# Patient Record
Sex: Female | Born: 2000 | Race: Black or African American | Hispanic: No
Health system: Southern US, Community
[De-identification: ages and names within clinical notes are randomized; demographics above are authoritative.]

## PROBLEM LIST (undated history)

## (undated) ENCOUNTER — Inpatient Hospital Stay (HOSPITAL_COMMUNITY): Payer: Self-pay

## (undated) ENCOUNTER — Emergency Department (HOSPITAL_COMMUNITY): Admission: EM | Payer: No Typology Code available for payment source

## (undated) DIAGNOSIS — D649 Anemia, unspecified: Secondary | ICD-10-CM

## (undated) DIAGNOSIS — F329 Major depressive disorder, single episode, unspecified: Secondary | ICD-10-CM

## (undated) DIAGNOSIS — F419 Anxiety disorder, unspecified: Secondary | ICD-10-CM

## (undated) DIAGNOSIS — I10 Essential (primary) hypertension: Secondary | ICD-10-CM

## (undated) DIAGNOSIS — Z3490 Encounter for supervision of normal pregnancy, unspecified, unspecified trimester: Secondary | ICD-10-CM

## (undated) DIAGNOSIS — N390 Urinary tract infection, site not specified: Secondary | ICD-10-CM

## (undated) DIAGNOSIS — F32A Depression, unspecified: Secondary | ICD-10-CM

## (undated) DIAGNOSIS — B9689 Other specified bacterial agents as the cause of diseases classified elsewhere: Secondary | ICD-10-CM

## (undated) DIAGNOSIS — N760 Acute vaginitis: Secondary | ICD-10-CM

## (undated) DIAGNOSIS — D573 Sickle-cell trait: Secondary | ICD-10-CM

## (undated) HISTORY — PX: NO PAST SURGERIES: SHX2092

## (undated) HISTORY — PX: FINGER SURGERY: SHX640

---
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
## 2018-01-22 ENCOUNTER — Emergency Department (HOSPITAL_COMMUNITY): Payer: No Typology Code available for payment source

## 2018-01-22 ENCOUNTER — Other Ambulatory Visit: Payer: Self-pay

## 2018-01-22 ENCOUNTER — Encounter (HOSPITAL_COMMUNITY): Payer: Self-pay | Admitting: Emergency Medicine

## 2018-01-22 ENCOUNTER — Emergency Department (HOSPITAL_COMMUNITY)
Admission: EM | Admit: 2018-01-22 | Discharge: 2018-01-22 | Disposition: A | Discharge status: Home / Self Care | Payer: No Typology Code available for payment source | Attending: Emergency Medicine | Admitting: Emergency Medicine

## 2018-01-22 DIAGNOSIS — M542 Cervicalgia: Secondary | ICD-10-CM | POA: Insufficient documentation

## 2018-01-22 DIAGNOSIS — F17200 Nicotine dependence, unspecified, uncomplicated: Secondary | ICD-10-CM | POA: Diagnosis not present

## 2018-01-22 DIAGNOSIS — Y939 Activity, unspecified: Secondary | ICD-10-CM | POA: Insufficient documentation

## 2018-01-22 DIAGNOSIS — Z79899 Other long term (current) drug therapy: Secondary | ICD-10-CM | POA: Diagnosis not present

## 2018-01-22 DIAGNOSIS — R1030 Lower abdominal pain, unspecified: Secondary | ICD-10-CM | POA: Insufficient documentation

## 2018-01-22 DIAGNOSIS — Y929 Unspecified place or not applicable: Secondary | ICD-10-CM | POA: Insufficient documentation

## 2018-01-22 DIAGNOSIS — Y999 Unspecified external cause status: Secondary | ICD-10-CM | POA: Insufficient documentation

## 2018-01-22 MED ORDER — IBUPROFEN 600 MG PO TABS: 600.0000 mg | ORAL_TABLET | Freq: Four times a day (QID) | ORAL | 0 refills | Status: DC | PRN

## 2018-01-22 MED ORDER — IBUPROFEN 400 MG PO TABS
600.0000 mg | ORAL_TABLET | Freq: Once | ORAL | Status: AC
  Administered 2018-01-22: 05:00:00 600 mg via ORAL
  Filled 2018-01-22: qty 1

## 2018-01-22 MED ORDER — ACETAMINOPHEN 500 MG PO TABS: 500.0000 mg | ORAL_TABLET | Freq: Four times a day (QID) | ORAL | 0 refills | Status: DC | PRN

## 2018-01-22 MED ORDER — METHOCARBAMOL 500 MG PO TABS: 500.0000 mg | ORAL_TABLET | Freq: Two times a day (BID) | ORAL | 0 refills | Status: DC
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
## 2018-03-13 ENCOUNTER — Encounter (HOSPITAL_COMMUNITY): Payer: Self-pay | Admitting: Emergency Medicine

## 2018-03-13 DIAGNOSIS — Z5321 Procedure and treatment not carried out due to patient leaving prior to being seen by health care provider: Secondary | ICD-10-CM | POA: Insufficient documentation

## 2018-03-13 DIAGNOSIS — R109 Unspecified abdominal pain: Secondary | ICD-10-CM | POA: Insufficient documentation

## 2018-03-13 LAB — URINALYSIS, ROUTINE W REFLEX MICROSCOPIC
BILIRUBIN URINE: NEGATIVE
Glucose, UA: NEGATIVE mg/dL
Hgb urine dipstick: NEGATIVE
Ketones, ur: NEGATIVE mg/dL
NITRITE: NEGATIVE
PH: 5 (ref 5.0–8.0)
Protein, ur: NEGATIVE mg/dL
SPECIFIC GRAVITY, URINE: 1.013 (ref 1.005–1.030)

## 2018-03-13 LAB — COMPREHENSIVE METABOLIC PANEL
ALBUMIN: 4 g/dL (ref 3.5–5.0)
ALK PHOS: 63 U/L (ref 38–126)
ALT: 15 U/L (ref 14–54)
AST: 22 U/L (ref 15–41)
Anion gap: 12 (ref 5–15)
BILIRUBIN TOTAL: 1.1 mg/dL (ref 0.3–1.2)
CO2: 21 mmol/L — ABNORMAL LOW (ref 22–32)
CREATININE: 0.96 mg/dL (ref 0.44–1.00)
Calcium: 9.3 mg/dL (ref 8.9–10.3)
Chloride: 105 mmol/L (ref 101–111)
GFR calc Af Amer: >60 mL/min (ref >60)
GLUCOSE: 85 mg/dL (ref 65–99)
Potassium: 3.7 mmol/L (ref 3.5–5.1)
Sodium: 138 mmol/L (ref 135–145)
TOTAL PROTEIN: 7.2 g/dL (ref 6.5–8.1)

## 2018-03-13 LAB — CBC
HEMATOCRIT: 38.8 % (ref 36.0–46.0)
Hemoglobin: 13.1 g/dL (ref 12.0–15.0)
MCH: 27.7 pg (ref 26.0–34.0)
MCHC: 33.8 g/dL (ref 30.0–36.0)
MCV: 82 fL (ref 78.0–100.0)
PLATELETS: 266 10*3/uL (ref 150–400)
RBC: 4.73 MIL/uL (ref 3.87–5.11)
RDW: 13.8 % (ref 11.5–15.5)
WBC: 9.4 10*3/uL (ref 4.0–10.5)

## 2018-03-13 LAB — LIPASE, BLOOD: Lipase: 27 U/L (ref 11–51)

## 2018-03-13 LAB — I-STAT BETA HCG BLOOD, ED (MC, WL, AP ONLY)

## 2018-03-13 MED ORDER — ONDANSETRON 4 MG PO TBDP
4.0000 mg | ORAL_TABLET | Freq: Once | ORAL | Status: AC | PRN
  Administered 2018-03-13: 4 mg via ORAL
  Filled 2018-03-13: qty 1

## 2018-03-13 NOTE — ED Triage Notes (Signed)
Pt states issues with her medicaid card, has missed 7 days of birth contort because she could not get a refill. Pt has had nausea for the duration. LMP 2 weeks ago. Pt states family member just had a stomach virus. Denies diarrhea. Afebrile at triage.

## 2018-03-14 ENCOUNTER — Emergency Department (HOSPITAL_COMMUNITY)
Admission: EM | Admit: 2018-03-14 | Discharge: 2018-03-14 | Disposition: A | Discharge status: Home / Self Care | Payer: Medicaid Other | Attending: Emergency Medicine | Admitting: Emergency Medicine

## 2018-03-14 NOTE — ED Notes (Signed)
Called x 2 for room, no answer.

## 2018-06-01 ENCOUNTER — Emergency Department (HOSPITAL_COMMUNITY)
Admission: EM | Admit: 2018-06-01 | Discharge: 2018-06-01 | Disposition: A | Discharge status: Home / Self Care | Payer: Medicaid Other | Attending: Emergency Medicine | Admitting: Emergency Medicine

## 2018-06-01 ENCOUNTER — Other Ambulatory Visit: Payer: Self-pay

## 2018-06-01 ENCOUNTER — Encounter (HOSPITAL_COMMUNITY): Payer: Self-pay

## 2018-06-01 DIAGNOSIS — Z87891 Personal history of nicotine dependence: Secondary | ICD-10-CM | POA: Insufficient documentation

## 2018-06-01 DIAGNOSIS — R1084 Generalized abdominal pain: Secondary | ICD-10-CM | POA: Diagnosis present

## 2018-06-01 DIAGNOSIS — R197 Diarrhea, unspecified: Secondary | ICD-10-CM | POA: Insufficient documentation

## 2018-06-01 DIAGNOSIS — Z79899 Other long term (current) drug therapy: Secondary | ICD-10-CM | POA: Insufficient documentation

## 2018-06-01 DIAGNOSIS — R112 Nausea with vomiting, unspecified: Secondary | ICD-10-CM | POA: Insufficient documentation

## 2018-06-01 LAB — URINALYSIS, ROUTINE W REFLEX MICROSCOPIC
BILIRUBIN URINE: NEGATIVE
GLUCOSE, UA: NEGATIVE mg/dL
Hgb urine dipstick: NEGATIVE
KETONES UR: NEGATIVE mg/dL
Nitrite: NEGATIVE
PH: 5 (ref 5.0–8.0)
PROTEIN: 30 mg/dL — AB
Specific Gravity, Urine: 1.013 (ref 1.005–1.030)

## 2018-06-01 LAB — LIPASE, BLOOD: LIPASE: 30 U/L (ref 11–51)

## 2018-06-01 LAB — COMPREHENSIVE METABOLIC PANEL
ALT: 13 U/L — ABNORMAL LOW (ref 14–54)
ANION GAP: 7 (ref 5–15)
AST: 17 U/L (ref 15–41)
Albumin: 4.1 g/dL (ref 3.5–5.0)
Alkaline Phosphatase: 56 U/L (ref 38–126)
BUN: 5 mg/dL — ABNORMAL LOW (ref 6–20)
CHLORIDE: 107 mmol/L (ref 101–111)
CO2: 24 mmol/L (ref 22–32)
CREATININE: 0.9 mg/dL (ref 0.44–1.00)
Calcium: 9.3 mg/dL (ref 8.9–10.3)
Glucose, Bld: 89 mg/dL (ref 65–99)
POTASSIUM: 3.9 mmol/L (ref 3.5–5.1)
SODIUM: 138 mmol/L (ref 135–145)
Total Bilirubin: 0.8 mg/dL (ref 0.3–1.2)
Total Protein: 6.9 g/dL (ref 6.5–8.1)

## 2018-06-01 LAB — CBC
HCT: 38.3 % (ref 36.0–46.0)
HEMOGLOBIN: 12.8 g/dL (ref 12.0–15.0)
MCH: 27.3 pg (ref 26.0–34.0)
MCHC: 33.4 g/dL (ref 30.0–36.0)
MCV: 81.7 fL (ref 78.0–100.0)
PLATELETS: 274 10*3/uL (ref 150–400)
RBC: 4.69 MIL/uL (ref 3.87–5.11)
RDW: 13.5 % (ref 11.5–15.5)
WBC: 7.2 10*3/uL (ref 4.0–10.5)

## 2018-06-01 LAB — I-STAT BETA HCG BLOOD, ED (MC, WL, AP ONLY): I-stat hCG, quantitative: <5 m[IU]/mL (ref <5)

## 2018-06-01 MED ORDER — GI COCKTAIL ~~LOC~~
30.0000 mL | Freq: Once | ORAL | Status: AC
  Administered 2018-06-01: 30 mL via ORAL
  Filled 2018-06-01: qty 30

## 2018-06-01 MED ORDER — ONDANSETRON 4 MG PO TBDP: 4.0000 mg | ORAL_TABLET | Freq: Three times a day (TID) | ORAL | 0 refills | Status: DC | PRN

## 2018-06-01 NOTE — Discharge Instructions (Signed)
Follow up with your PCP.  Return for inability to eat or drink, persistent fever >5 days, sudden worsening abdominal pain. ° °

## 2018-06-01 NOTE — ED Notes (Signed)
Urine culture tube sent to mail lab to hold.

## 2018-06-01 NOTE — ED Triage Notes (Signed)
Pt states she has been having abdominal pain with blood in her stool and vomiting. She states her stool is loose dark orange and brown. Last BM today, states she last saw blood in her stool 4 days ago and was told by her pcp she thought it was irritation. Also reports she was told by her pcp that her "liver was swollen".

## 2018-06-01 NOTE — ED Provider Notes (Signed)
[facility] EMERGENCY DEPARTMENT Provider Note   CSN: [id number] Arrival date & time: 06/01/18  1340     History   Chief Complaint Chief Complaint  Patient presents with  . Abdominal Pain    HPI Andrea Archer is a 17 y.o. female.  16 yo F with a chief complaint of diffuse abdominal pain.  This is described as crampy.  Associated with vomiting and diarrhea.  Was initially preceded with constipation.  She feels that she has had some lead in her stool.  Describes it as bright red.  Denies lightheaded dizzy near syncopal.  Denies fevers or chills.  She has a family member that had a similar illness though she thinks is unlikely that she caught it from them.  The history is provided by the patient.  Abdominal Pain   This is a new problem. The current episode started more than 1 week ago. The problem occurs constantly. The problem has been gradually worsening. The pain is located in the generalized abdominal region. The quality of the pain is sharp and shooting. The pain is at a severity of 10/10. The pain is severe. Associated symptoms include diarrhea, nausea and vomiting. Pertinent negatives include fever, dysuria, headaches, arthralgias and myalgias. Nothing aggravates the symptoms. Nothing relieves the symptoms.    History reviewed. No pertinent past medical history.  There are no active problems to display for this patient.   No past surgical history on file.   OB History   None      Home Medications    Prior to Admission medications   Medication Sig Start Date End Date Taking? Authorizing Provider  acetaminophen (TYLENOL) 500 MG tablet Take 1 tablet (500 mg total) by mouth every 6 (six) hours as needed. 01/22/18   Law, Bea Graff, PA-C  cephALEXin (KEFLEX) 500 MG capsule Take 1 capsule (500 mg total) by mouth 4 (four) times daily. Take for 10 days. Do not stop antibiotic before 10 days even with resolution of symptoms 08/22/16   Ancil Linsey, MD    dicyclomine (BENTYL) 20 MG tablet Take 1 tablet (20 mg total) by mouth 2 (two) times daily as needed (abominal cramping). 11/11/15   Muthersbaugh, Jarrett Soho, PA-C  ibuprofen (ADVIL,MOTRIN) 600 MG tablet Take 1 tablet (600 mg total) by mouth every 6 (six) hours as needed. 01/22/18   Law, Bea Graff, PA-C  methocarbamol (ROBAXIN) 500 MG tablet Take 1 tablet (500 mg total) by mouth 2 (two) times daily. 01/22/18   Law, Bea Graff, PA-C  naproxen (NAPROSYN) 500 MG tablet Take 1 tablet (500 mg total) by mouth 2 (two) times daily. 02/20/17   Horton, Barbette Hair, MD  ondansetron (ZOFRAN ODT) 4 MG disintegrating tablet Take 1 tablet (4 mg total) by mouth every 8 (eight) hours as needed for nausea or vomiting. 06/01/18   Deno Etienne, DO    Family History No family history on file.  Social History Social History   Tobacco Use  . Smoking status: Former Research scientist (life sciences)  . Smokeless tobacco: Never Used  Substance Use Topics  . Alcohol use: No  . Drug use: No     Allergies   Patient has no known allergies.   Review of Systems Review of Systems  Constitutional: Negative for chills and fever.  HENT: Negative for congestion and rhinorrhea.   Eyes: Negative for redness and visual disturbance.  Respiratory: Negative for shortness of breath and wheezing.   Cardiovascular: Negative for chest pain and palpitations.  Gastrointestinal: Positive for abdominal pain,  blood in stool, diarrhea, nausea and vomiting.  Genitourinary: Negative for dysuria and urgency.  Musculoskeletal: Negative for arthralgias and myalgias.  Skin: Negative for pallor and wound.  Neurological: Negative for dizziness and headaches.     Physical Exam Updated Vital Signs BP (!) 119/2 (BP Location: Right Arm)   Pulse 72   Temp 99.2 F (37.3 C) (Oral)   Resp 16   Ht 5\' 6"  (1.676 m)   Wt 60.8 kg (134 lb)   LMP 05/20/2018   SpO2 100%   BMI 21.63 kg/m   Physical Exam  Constitutional: She is oriented to person, place, and time. She  appears well-developed and well-nourished. No distress.  HENT:  Head: Normocephalic and atraumatic.  Eyes: Pupils are equal, round, and reactive to light. EOM are normal.  Neck: Normal range of motion. Neck supple.  Cardiovascular: Normal rate and regular rhythm. Exam reveals no gallop and no friction rub.  No murmur heard. Pulmonary/Chest: Effort normal. She has no wheezes. She has no rales.  Abdominal: Soft. She exhibits no distension and no mass. There is tenderness (mild diffuse). There is no guarding.  Musculoskeletal: She exhibits no edema or tenderness.  Neurological: She is alert and oriented to person, place, and time.  Skin: Skin is warm and dry. She is not diaphoretic.  Psychiatric: She has a normal mood and affect. Her behavior is normal.  Nursing note and vitals reviewed.    ED Treatments / Results  Labs (all labs ordered are listed, but only abnormal results are displayed) Labs Reviewed  COMPREHENSIVE METABOLIC PANEL - Abnormal; Notable for the following components:      Result Value   BUN 5 (*)    ALT 13 (*)    All other components within normal limits  URINALYSIS, ROUTINE W REFLEX MICROSCOPIC - Abnormal; Notable for the following components:   Color, Urine AMBER (*)    APPearance TURBID (*)    Protein, ur 30 (*)    Leukocytes, UA LARGE (*)    Bacteria, UA MANY (*)    All other components within normal limits  GASTROINTESTINAL PANEL BY PCR, STOOL (REPLACES STOOL CULTURE)  LIPASE, BLOOD  CBC  I-STAT BETA HCG BLOOD, ED ([facility], [facility], [facility] ONLY)    EKG None  Radiology No results found.  Procedures Procedures (including critical care time)  Medications Ordered in ED Medications  gi cocktail (Maalox,Lidocaine,Donnatal) (30 mLs Oral Given 06/01/18 1603)     Initial Impression / Assessment and Plan / ED Course  I have reviewed the triage vital signs and the nursing notes.  Pertinent labs & imaging results that were available during my care of the patient were  reviewed by me and considered in my medical decision making (see chart for details).     17 yo F with a chief complaint of diffuse abdominal pain.  She is describing some blood in her stool.  Her symptoms of also gone on for a week and a half.  Because of this I will send off a stool study.  Have the patient take Zofran at home.  She was given GI follow-up as she is almost in the age range for inflammatory bowel disease.  Suggested she follow-up with her PCP in a couple days.  Strict return precautions were given.  4:33 PM:  I have discussed the diagnosis/risks/treatment options with the patient and believe the pt to be eligible for discharge home to follow-up with PCP, GI. We also discussed returning to the ED immediately if new  or worsening sx occur. We discussed the sx which are most concerning (e.g., sudden worsening pain, fever, inability to tolerate by mouth) that necessitate immediate return. Medications administered to the patient during their visit and any new prescriptions provided to the patient are listed below.  Medications given during this visit Medications  gi cocktail (Maalox,Lidocaine,Donnatal) (30 mLs Oral Given 06/01/18 1603)      The patient appears reasonably screen and/or stabilized for discharge and I doubt any other medical condition or other [facility] requiring further screening, evaluation, or treatment in the ED at this time prior to discharge.    Final Clinical Impressions(s) / ED Diagnoses   Final diagnoses:  Nausea vomiting and diarrhea    ED Discharge Orders        Ordered    ondansetron (ZOFRAN ODT) 4 MG disintegrating tablet  Every 8 hours PRN     06/01/18 [place], [place], DO 06/01/18 1633

## 2018-06-03 ENCOUNTER — Ambulatory Visit: Payer: Medicaid Other | Admitting: Gastroenterology

## 2018-06-17 ENCOUNTER — Encounter: Payer: Self-pay | Admitting: Gastroenterology

## 2018-06-17 ENCOUNTER — Ambulatory Visit (INDEPENDENT_AMBULATORY_CARE_PROVIDER_SITE_OTHER): Payer: Medicaid Other | Admitting: Gastroenterology

## 2018-06-17 VITALS — BP 112/72 | HR 84 | Ht 65.0 in | Wt 134.0 lb

## 2018-06-17 DIAGNOSIS — R1032 Left lower quadrant pain: Secondary | ICD-10-CM | POA: Diagnosis not present

## 2018-06-17 DIAGNOSIS — R194 Change in bowel habit: Secondary | ICD-10-CM

## 2018-06-17 MED ORDER — DICYCLOMINE HCL 10 MG PO CAPS: 10.0000 mg | ORAL_CAPSULE | Freq: Two times a day (BID) | ORAL | 1 refills | Status: DC

## 2018-06-17 NOTE — Patient Instructions (Signed)
Your provider has requested that you go to the basement level for lab work before leaving today. Press "B" on the elevator. The lab is located at the first door on the left as you exit the elevator.  We have sent the following medications to your pharmacy for you to pick up at your convenience:  Dicyclomine 10 mg twice a day as needed.   Use imodium as needed for diarrhea.   You have been scheduled for a CT scan of the abdomen and pelvis at [facility] ([address]---this is in the same building as Press [facility]).   You are scheduled on 06-20-18 at 8:30 am. You should arrive 15 minutes prior to your appointment time for registration. Please follow the written instructions below on the day of your exam:  WARNING: IF YOU ARE ALLERGIC TO IODINE/X-RAY DYE, PLEASE NOTIFY RADIOLOGY IMMEDIATELY AT [phone]! YOU WILL BE GIVEN A 13 HOUR PREMEDICATION PREP.  1) Do not eat or drink anything after 4:30 am (4 hours prior to your test) 2) You have been given 2 bottles of oral contrast to drink. The solution may taste better if refrigerated, but do NOT add ice or any other liquid to this solution. Shake well before drinking.    Drink 1 bottle of contrast @ 6:30 am(2 hours prior to your exam)  Drink 1 bottle of contrast @ 7:30 am (1 hour prior to your exam)  You may take any medications as prescribed with a small amount of water except for the following: Metformin, Glucophage, Glucovance, Avandamet, Riomet, Fortamet, Actoplus Met, Janumet, Glumetza or Metaglip. The above medications must be held the day of the exam AND 48 hours after the exam.  The purpose of you drinking the oral contrast is to aid in the visualization of your intestinal tract. The contrast solution may cause some diarrhea. Before your exam is started, you will be given a small amount of fluid to drink. Depending on your individual set of symptoms, you may also receive an intravenous injection of x-ray contrast/dye.  Plan on being at [facility] for 30 minutes or longer, depending on the type of exam you are having performed.  This test typically takes 30-45 minutes to complete.  If you have any questions regarding your exam or if you need to reschedule, you may call the CT department at [phone] between the hours of 8:00 am and 5:00 pm, Monday-Friday.  ________________________________________________________________________

## 2018-06-17 NOTE — Progress Notes (Signed)
06/17/2018 Andrea Archer [id number] May 05, 2001   HISTORY OF PRESENT ILLNESS: This is a pleasant 17 year old female who is new to our office.  She presents to our office today as a recommendation from the emergency department as follow-up for evaluation regarding left-sided abdominal pain, change in bowel habits with loose stool/diarrhea, and rectal bleeding.  She tells me that she is really had stomach pains to some degree on and off for quite some time.  She admits to a lot of anxiety as well.  She says that recently, however, she started having severe abdominal pain particularly on the left side.  She says that it gets very severe to a 10 out of 10 on the pain scale at times.  She says that she has had hard stools in the past that come out in pieces, but recently she's been having very loose stools.  She is also seeing blood in her stool as well.  In the emergency department CBC, CMP, lipase were.  No imaging was performed.  She did have a large amount of leukocytes and many bacteria in her urine, but was not treated for UTI.   History reviewed. No pertinent past medical history. Past Surgical History:  Procedure Laterality Date  . NO PAST SURGERIES      reports that she has quit smoking. She has never used smokeless tobacco. She reports that she does not drink alcohol or use drugs. family history includes Diabetes in her maternal grandmother; Fibromyalgia in her maternal grandmother; Heart disease in her maternal grandmother; Hypertension in her maternal grandmother; Pancreatic cancer in her maternal grandfather. No Known Allergies    Outpatient Encounter Medications as of 06/17/2018  Medication Sig  . ondansetron (ZOFRAN ODT) 4 MG disintegrating tablet Take 1 tablet (4 mg total) by mouth every 8 (eight) hours as needed for nausea or vomiting.  . [DISCONTINUED] acetaminophen (TYLENOL) 500 MG tablet Take 1 tablet (500 mg total) by mouth every 6 (six) hours as needed.  . [DISCONTINUED]  cephALEXin (KEFLEX) 500 MG capsule Take 1 capsule (500 mg total) by mouth 4 (four) times daily. Take for 10 days. Do not stop antibiotic before 10 days even with resolution of symptoms  . [DISCONTINUED] dicyclomine (BENTYL) 20 MG tablet Take 1 tablet (20 mg total) by mouth 2 (two) times daily as needed (abominal cramping).  . [DISCONTINUED] ibuprofen (ADVIL,MOTRIN) 600 MG tablet Take 1 tablet (600 mg total) by mouth every 6 (six) hours as needed.  . [DISCONTINUED] methocarbamol (ROBAXIN) 500 MG tablet Take 1 tablet (500 mg total) by mouth 2 (two) times daily.  . [DISCONTINUED] naproxen (NAPROSYN) 500 MG tablet Take 1 tablet (500 mg total) by mouth 2 (two) times daily.   No facility-administered encounter medications on file as of 06/17/2018.      REVIEW OF SYSTEMS  : All other systems reviewed and negative except where noted in the History of Present Illness.   PHYSICAL EXAM: BP 112/72   Pulse 84   Ht 5\' 5"  (1.651 m)   Wt 134 lb (60.8 kg)   LMP 05/20/2018   BMI 22.30 kg/m  General: Well developed black female in no acute distress Head: Normocephalic and atraumatic Eyes:  Sclerae anicteric, conjunctiva pink. Ears: Normal auditory acuity. Lungs: Clear throughout to auscultation; no increased WOB. Heart: Regular rate and rhythm; no M/R/G. Abdomen: Soft, non-distended.  BS present.  Moderate left sided TTP. Musculoskeletal: Symmetrical with no gross deformities  Skin: No lesions on visible extremities Extremities: No edema  Neurological:  Alert oriented x 4, grossly non-focal Psychological:  Alert and cooperative. Normal mood and affect  ASSESSMENT AND PLAN: *17 year old female with complaints of left lower quadrant abdominal pain and change in bowel habits with loose stools and rectal bleeding.  Pain very severe at times.  Bleeding sounds minor, nonetheless has still been present.  I am going to start with stool studies, C. difficile and GI pathogen panel.  A am going to repeat a  urinalysis as she had a dirty urine during recent ER visit and was not treated.  Will order CT scan abdomen pelvis with contrast.  Can use Imodium as needed for diarrhea.  I gave her prescription for dicyclomine 10 mg twice daily to take for abdominal pain.  She may end up with a colonoscopy as well.  She does admit to a lot of anxiety so question if this could be a lot of irritable bowel type symptoms, but I also have concern for possible Crohn's/IBD.   CC:  Duard Larsen, MD

## 2018-06-18 DIAGNOSIS — R194 Change in bowel habit: Secondary | ICD-10-CM | POA: Insufficient documentation

## 2018-06-18 NOTE — Progress Notes (Signed)
Agree with assessment and plan as outlined. Will await results of labs and imaging first. If etiology remains unclear given her bleeding she will likely warrant colonoscopy

## 2018-06-20 ENCOUNTER — Ambulatory Visit (INDEPENDENT_AMBULATORY_CARE_PROVIDER_SITE_OTHER)
Admission: RE | Admit: 2018-06-20 | Discharge: 2018-06-20 | Disposition: A | Discharge status: Home / Self Care | Payer: Medicaid Other | Source: Ambulatory Visit | Attending: Gastroenterology | Admitting: Gastroenterology

## 2018-06-20 DIAGNOSIS — R1032 Left lower quadrant pain: Secondary | ICD-10-CM | POA: Diagnosis not present

## 2018-06-20 MED ORDER — IOPAMIDOL (ISOVUE-300) INJECTION 61%
100.0000 mL | Freq: Once | INTRAVENOUS | Status: AC | PRN
  Administered 2018-06-20: 100 mL via INTRAVENOUS

## 2018-06-23 ENCOUNTER — Inpatient Hospital Stay: Admission: RE | Admit: 2018-06-23 | Payer: Medicaid Other | Source: Ambulatory Visit

## 2018-06-23 ENCOUNTER — Telehealth: Payer: Self-pay | Admitting: Gastroenterology

## 2018-06-23 NOTE — Telephone Encounter (Signed)
Janett Billow have you seen the CT results?  The pt has called for recommendations.

## 2018-06-24 NOTE — Telephone Encounter (Signed)
Please let her know that her CT scan was normal.  Did not show any cause of her symptoms.  Still needs to perform urine and stool studies as soon as possible.  Thank you,  Jess

## 2018-06-25 NOTE — Telephone Encounter (Signed)
The pt has been advised and will bring in urine and stool sample as soon as possible.

## 2018-11-14 ENCOUNTER — Telehealth: Payer: Self-pay | Admitting: Licensed Clinical Social Worker

## 2018-11-14 NOTE — Telephone Encounter (Signed)
Called pt regarding scheduled appt. Pt confirmed

## 2018-11-18 ENCOUNTER — Ambulatory Visit (INDEPENDENT_AMBULATORY_CARE_PROVIDER_SITE_OTHER): Payer: Medicaid Other | Admitting: Obstetrics and Gynecology

## 2018-11-18 ENCOUNTER — Encounter: Payer: Self-pay | Admitting: Obstetrics and Gynecology

## 2018-11-18 DIAGNOSIS — Z3049 Encounter for surveillance of other contraceptives: Secondary | ICD-10-CM | POA: Diagnosis not present

## 2018-11-18 DIAGNOSIS — Z3046 Encounter for surveillance of implantable subdermal contraceptive: Secondary | ICD-10-CM

## 2018-11-18 NOTE — Progress Notes (Signed)
Patient ID: Andrea Archer, female   DOB: May 29, 2001, 17 y.o.   MRN: [id number]   [place] PROCEDURE NOTE  Andrea Archer is a 17 y.o. G0P0000 here for Nexplanon removal.  .  No other gynecologic concerns.   Nexplanon Removal Patient identified, informed consent performed, consent signed.   Appropriate time out taken. Nexplanon site identified.  Area prepped in usual sterile fashon. One ml of 1% lidocaine was used to anesthetize the area at the distal end of the implant. A small stab incision was made right beside the implant on the distal portion.  The Nexplanon rod was grasped using hemostats and removed without difficulty.  There was minimal blood loss. There were no complications.  3 ml of 1% lidocaine was injected around the incision for post-procedure analgesia.  Steri-strips were applied over the small incision.  A pressure bandage was applied to reduce any bruising.  The patient tolerated the procedure well and was given post procedure instructions.  Patient declines contraception

## 2018-11-18 NOTE — Patient Instructions (Signed)
.  uaa

## 2018-11-18 NOTE — Progress Notes (Signed)
NGYN presents for Nexplanon removal. Nexplanon is causing mood disorders and intermenstrual heavy vaginal bleeding changing pads every 4 hrs.

## 2018-11-21 ENCOUNTER — Ambulatory Visit: Payer: Medicaid Other

## 2018-11-21 ENCOUNTER — Encounter: Payer: Self-pay | Admitting: Obstetrics

## 2018-11-21 DIAGNOSIS — Z09 Encounter for follow-up examination after completed treatment for conditions other than malignant neoplasm: Secondary | ICD-10-CM

## 2018-11-21 NOTE — Progress Notes (Signed)
Pt called this morning stating the steristrips from nexplanon removal site peeled off and the incision was opening up. I advised pt come in so I could take a look and re-dress the incision. Pt arrived. I assessed the incision site, site did not have any signs of infection. I cleaned the incision and put more steristrips and a bandage on it. Advised pt to look for signs of infection over the weekend and if she starts to experience any to go have it looked at at an urgent care, pt verbalized understanding.

## 2018-12-02 ENCOUNTER — Encounter: Payer: Self-pay | Admitting: *Deleted

## 2018-12-30 ENCOUNTER — Other Ambulatory Visit: Payer: Self-pay | Admitting: Pediatrics

## 2018-12-30 ENCOUNTER — Ambulatory Visit
Admission: RE | Admit: 2018-12-30 | Discharge: 2018-12-30 | Disposition: A | Discharge status: Home / Self Care | Payer: Medicaid Other | Source: Ambulatory Visit | Attending: Pediatrics | Admitting: Pediatrics

## 2018-12-30 DIAGNOSIS — M79645 Pain in left finger(s): Secondary | ICD-10-CM

## 2018-12-30 DIAGNOSIS — R52 Pain, unspecified: Secondary | ICD-10-CM

## 2018-12-31 NOTE — L&D Delivery Note (Signed)
Patient with variable and prolonged decelerations that occurred after pushing efforts.  She was give 5L of O2 with improvement of FHR and return to baseline after deceleration.  Dr. Montez Morita remained at bedside in case vacuum assistance required. Patient delivered as below with staff and FOB support.   Delivery Note At 5:07 AM, on November 16, 2019 a viable female "Andrea Archer" was delivered via Vaginal, Spontaneous (Presentation: Left Occiput Anterior with restitution to LOT ).  After delivery of head, maternal pushing efforts greatly decreased and intensive encouragement needed. Shoulders delivered easily, but infant with minimal grimace. Tactile stimulation and bulb suction given by provider and infant placed on mother's abdomen where nurse continued tactile stimulation. After 45 second delay, the umbilical cord was clamped, cut by support person, and blood collected by provider.  Infant to warmer with nurse and Code Apgar called.  Infant recovered prior to team arrival.  Infant  APGAR: 6, 9.  Placenta delivered spontaneously via Delena Bali and was noted to be intact with 3VC upon inspection. Vaginal inspection revealed a clitoral laceration that was repaired with 3-0 vicryl on SH.  No additional anesthetic necessary and patient tolerated the procedure well. Fundus firm, at the umbilicus, and bleeding small.  Mother hemodynamically stable and infant skin to skin prior to provider exit.  Mother desires no birth control and opts to breastfeed.  Infant weight at one hour of life: 7lbs 3.9 oz, 20in   Anesthesia:  Epidural Episiotomy:  None Lacerations:  Clitoral Suture Repair: 3.0 vicryl Est. Blood Loss (mL): 245  Mom to postpartum.  Baby to Couplet care / Skin to Skin.  Maryann Conners MSN, CNM Advanced Practice Provider, [facility] 11/16/2019, 5:58 AM

## 2019-01-09 HISTORY — PX: FRACTURE SURGERY: SHX138

## 2019-03-06 ENCOUNTER — Inpatient Hospital Stay (HOSPITAL_COMMUNITY)
Admission: AD | Admit: 2019-03-06 | Discharge: 2019-03-06 | Discharge status: Against Medical Advice | Payer: Medicaid Other | Attending: Obstetrics & Gynecology | Admitting: Obstetrics & Gynecology

## 2019-03-06 ENCOUNTER — Other Ambulatory Visit: Payer: Self-pay

## 2019-03-06 ENCOUNTER — Encounter (HOSPITAL_COMMUNITY): Payer: Self-pay | Admitting: *Deleted

## 2019-03-06 DIAGNOSIS — R1031 Right lower quadrant pain: Secondary | ICD-10-CM | POA: Insufficient documentation

## 2019-03-06 LAB — CBC
HCT: 37.4 % (ref 36.0–46.0)
Hemoglobin: 12.7 g/dL (ref 12.0–15.0)
MCH: 27.6 pg (ref 26.0–34.0)
MCHC: 34 g/dL (ref 30.0–36.0)
MCV: 81.3 fL (ref 80.0–100.0)
Platelets: 285 10*3/uL (ref 150–400)
RBC: 4.6 MIL/uL (ref 3.87–5.11)
RDW: 13.3 % (ref 11.5–15.5)
WBC: 8.6 10*3/uL (ref 4.0–10.5)
nRBC: 0 % (ref 0.0–0.2)

## 2019-03-06 LAB — POCT PREGNANCY, URINE: Preg Test, Ur: POSITIVE — AB

## 2019-03-06 LAB — HCG, QUANTITATIVE, PREGNANCY: hCG, Beta Chain, Quant, S: 688 m[IU]/mL — ABNORMAL HIGH (ref <5)

## 2019-03-06 NOTE — MAU Note (Signed)
Pt requested to leave and will come back later if needed. Pt signed AMA. Discussed foods/flds to try and to avoid for n/v. Helped pt find app on phone for [place] to get lab results. Pt voices understanding to return to MAU as needed.

## 2019-03-06 NOTE — MAU Note (Signed)
+  HPTs yesterday.  Having pain in RLQ 3 days, started hurting bad.  For past 2 wks, hasn't really been able to eat, has to force herself, nauseated.  No appetite.

## 2019-03-18 ENCOUNTER — Inpatient Hospital Stay (HOSPITAL_COMMUNITY)
Admission: AD | Admit: 2019-03-18 | Discharge: 2019-03-18 | Disposition: A | Discharge status: Home / Self Care | Payer: Medicaid Other | Attending: Obstetrics and Gynecology | Admitting: Obstetrics and Gynecology

## 2019-03-18 ENCOUNTER — Inpatient Hospital Stay (HOSPITAL_COMMUNITY): Payer: Medicaid Other

## 2019-03-18 ENCOUNTER — Encounter (HOSPITAL_COMMUNITY): Payer: Self-pay | Admitting: *Deleted

## 2019-03-18 ENCOUNTER — Other Ambulatory Visit: Payer: Self-pay

## 2019-03-18 DIAGNOSIS — Z3A01 Less than 8 weeks gestation of pregnancy: Secondary | ICD-10-CM | POA: Diagnosis not present

## 2019-03-18 DIAGNOSIS — R109 Unspecified abdominal pain: Secondary | ICD-10-CM | POA: Diagnosis not present

## 2019-03-18 DIAGNOSIS — O99331 Smoking (tobacco) complicating pregnancy, first trimester: Secondary | ICD-10-CM | POA: Diagnosis not present

## 2019-03-18 DIAGNOSIS — Z833 Family history of diabetes mellitus: Secondary | ICD-10-CM | POA: Insufficient documentation

## 2019-03-18 DIAGNOSIS — O99341 Other mental disorders complicating pregnancy, first trimester: Secondary | ICD-10-CM | POA: Diagnosis not present

## 2019-03-18 DIAGNOSIS — F419 Anxiety disorder, unspecified: Secondary | ICD-10-CM | POA: Diagnosis not present

## 2019-03-18 DIAGNOSIS — Z79899 Other long term (current) drug therapy: Secondary | ICD-10-CM | POA: Insufficient documentation

## 2019-03-18 DIAGNOSIS — M549 Dorsalgia, unspecified: Secondary | ICD-10-CM | POA: Diagnosis not present

## 2019-03-18 DIAGNOSIS — O9989 Other specified diseases and conditions complicating pregnancy, childbirth and the puerperium: Secondary | ICD-10-CM | POA: Insufficient documentation

## 2019-03-18 DIAGNOSIS — O26891 Other specified pregnancy related conditions, first trimester: Secondary | ICD-10-CM

## 2019-03-18 DIAGNOSIS — Z800 Family history of malignant neoplasm of digestive organs: Secondary | ICD-10-CM | POA: Insufficient documentation

## 2019-03-18 DIAGNOSIS — Z3491 Encounter for supervision of normal pregnancy, unspecified, first trimester: Secondary | ICD-10-CM

## 2019-03-18 DIAGNOSIS — F329 Major depressive disorder, single episode, unspecified: Secondary | ICD-10-CM | POA: Diagnosis not present

## 2019-03-18 DIAGNOSIS — F17290 Nicotine dependence, other tobacco product, uncomplicated: Secondary | ICD-10-CM | POA: Diagnosis not present

## 2019-03-18 HISTORY — DX: Anxiety disorder, unspecified: F41.9

## 2019-03-18 HISTORY — DX: Major depressive disorder, single episode, unspecified: F32.9

## 2019-03-18 HISTORY — DX: Depression, unspecified: F32.A

## 2019-03-18 LAB — WET PREP, GENITAL
Clue Cells Wet Prep HPF POC: NONE SEEN
Sperm: NONE SEEN
Trich, Wet Prep: NONE SEEN
Yeast Wet Prep HPF POC: NONE SEEN

## 2019-03-18 LAB — URINALYSIS, ROUTINE W REFLEX MICROSCOPIC
Bilirubin Urine: NEGATIVE
Glucose, UA: NEGATIVE mg/dL
Hgb urine dipstick: NEGATIVE
Ketones, ur: NEGATIVE mg/dL
Nitrite: NEGATIVE
Protein, ur: NEGATIVE mg/dL
Specific Gravity, Urine: 1.006 (ref 1.005–1.030)
pH: 6 (ref 5.0–8.0)

## 2019-03-18 LAB — CBC
HCT: 36.7 % (ref 36.0–46.0)
Hemoglobin: 13 g/dL (ref 12.0–15.0)
MCH: 28.5 pg (ref 26.0–34.0)
MCHC: 35.4 g/dL (ref 30.0–36.0)
MCV: 80.5 fL (ref 80.0–100.0)
Platelets: 233 10*3/uL (ref 150–400)
RBC: 4.56 MIL/uL (ref 3.87–5.11)
RDW: 13.3 % (ref 11.5–15.5)
WBC: 7 10*3/uL (ref 4.0–10.5)
nRBC: 0 % (ref 0.0–0.2)

## 2019-03-18 LAB — ABO/RH: ABO/RH(D): O POS

## 2019-03-18 LAB — HCG, QUANTITATIVE, PREGNANCY: hCG, Beta Chain, Quant, S: 25009 m[IU]/mL — ABNORMAL HIGH (ref <5)

## 2019-03-18 MED ORDER — ACETAMINOPHEN 500 MG PO TABS
1000.0000 mg | ORAL_TABLET | Freq: Once | ORAL | Status: AC
  Administered 2019-03-18: 1000 mg via ORAL
  Filled 2019-03-18: qty 2

## 2019-03-18 NOTE — MAU Provider Note (Signed)
History     CSN: [id number]  Arrival date and time: 03/18/19 1115   First Provider Initiated Contact with Patient 03/18/19 1204      Chief Complaint  Patient presents with  . Abdominal Pain  . Back Pain   Andrea Archer is a 18 y.o. G1P0 at [redacted]w[redacted]d by LMP who presents to MAU with complaints of abdominal pain and back pain. She reports symptoms started occurring over the past 4 days. Describes the abdominal pain as sharp pain and cramping that is specific to right lower quadrant and radiates to lower back. Rates pain 8/10- has not taken any medication for abdominal pain or back pain. Reports pain worsens after she urinates. She denies vaginal discharge or vaginal bleeding. She denies frequency or burning with urination or any other symptoms of UTI.    OB History    Gravida  1   Para  0   Term  0   Preterm  0   AB  0   Living  0     SAB  0   TAB  0   Ectopic  0   Multiple  0   Live Births  0           Past Medical History:  Diagnosis Date  . Anxiety   . Depression     Past Surgical History:  Procedure Laterality Date  . FINGER SURGERY Left    Pinky Finger  . NO PAST SURGERIES      Family History  Problem Relation Age of Onset  . Diabetes Maternal Grandmother   . Hypertension Maternal Grandmother   . Heart disease Maternal Grandmother   . Fibromyalgia Maternal Grandmother   . Pancreatic cancer Maternal Grandfather   . Hypertension Mother   . Colon cancer Neg Hx   . Stomach cancer Neg Hx     Social History   Tobacco Use  . Smoking status: Former Smoker    Types: Cigars  . Smokeless tobacco: Never Used  Substance Use Topics  . Alcohol use: No  . Drug use: No    Allergies: No Known Allergies  Medications Prior to Admission  Medication Sig Dispense Refill Last Dose  . dicyclomine (BENTYL) 10 MG capsule Take 1 capsule (10 mg total) by mouth 2 (two) times daily. 60 capsule 1   . ondansetron (ZOFRAN ODT) 4 MG disintegrating tablet Take 1 tablet  (4 mg total) by mouth every 8 (eight) hours as needed for nausea or vomiting. 20 tablet 0     Review of Systems  Constitutional: Negative.   Respiratory: Negative.   Cardiovascular: Negative.   Gastrointestinal: Positive for abdominal pain. Negative for constipation, diarrhea, nausea and vomiting.  Genitourinary: Negative.   Musculoskeletal: Positive for back pain.   Physical Exam   Blood pressure 131/75, pulse 84, temperature 98.6 F (37 C), temperature source Oral, resp. rate 16, weight 61.1 kg, last menstrual period 02/06/2019, SpO2 100 %.  Physical Exam  Nursing note and vitals reviewed. Constitutional: She is oriented to person, place, and time. She appears well-developed and well-nourished. No distress.  Cardiovascular: Normal rate, regular rhythm and normal heart sounds.  No murmur heard. Respiratory: Effort normal and breath sounds normal. No respiratory distress. She has no wheezes. She has no rales.  GI: Soft. She exhibits no distension. There is abdominal tenderness. There is guarding. There is no rebound.  Tenderness in right lower quadrant and uterus  Genitourinary:    Genitourinary Comments: Bimanual exam: Cervix 0/long/high, firm, anterior, neg  CMT, uterus tender, nonenlarged, left adnexa without tenderness, enlargement, or mass. Right adnexa with tenderness, no enlargement, or mass. Negative rebound    Musculoskeletal: Normal range of motion.        General: No edema.  Neurological: She is alert and oriented to person, place, and time.  Psychiatric: She has a normal mood and affect. Her behavior is normal. Thought content normal.   MAU Course  Procedures  MDM Orders Placed This Encounter  Procedures  . Wet prep, genital  . US OB LESS THAN 14 WEEKS WITH OB TRANSVAGINAL  . Urinalysis, Routine w reflex microscopic  . CBC  . hCG, quantitative, pregnancy  . HIV Antibody (routine testing w rflx)  . ABO/Rh   Labs and [place] results reviewed:  Results for orders  placed or performed during the hospital encounter of 03/18/19 (from the past 24 hour(s))  Urinalysis, Routine w reflex microscopic     Status: Abnormal   Collection Time: 03/18/19 11:45 AM  Result Value Ref Range   Color, Urine YELLOW YELLOW   APPearance CLOUDY (A) CLEAR   Specific Gravity, Urine 1.006 1.005 - 1.030   pH 6.0 5.0 - 8.0   Glucose, UA NEGATIVE NEGATIVE mg/dL   Hgb urine dipstick NEGATIVE NEGATIVE   Bilirubin Urine NEGATIVE NEGATIVE   Ketones, ur NEGATIVE NEGATIVE mg/dL   Protein, ur NEGATIVE NEGATIVE mg/dL   Nitrite NEGATIVE NEGATIVE   Leukocytes,Ua TRACE (A) NEGATIVE   RBC / HPF 0-5 0 - 5 RBC/hpf   WBC, UA 0-5 0 - 5 WBC/hpf   Bacteria, UA RARE (A) NONE SEEN   Squamous Epithelial / LPF 0-5 0 - 5  Wet prep, genital     Status: Abnormal   Collection Time: 03/18/19 12:12 PM  Result Value Ref Range   Yeast Wet Prep HPF POC NONE SEEN NONE SEEN   Trich, Wet Prep NONE SEEN NONE SEEN   Clue Cells Wet Prep HPF POC NONE SEEN NONE SEEN   WBC, Wet Prep HPF POC FEW (A) NONE SEEN   Sperm NONE SEEN   CBC     Status: None   Collection Time: 03/18/19 12:15 PM  Result Value Ref Range   WBC 7.0 4.0 - 10.5 K/uL   RBC 4.56 3.87 - 5.11 MIL/uL   Hemoglobin 13.0 12.0 - 15.0 g/dL   HCT 36.7 36.0 - 46.0 %   MCV 80.5 80.0 - 100.0 fL   MCH 28.5 26.0 - 34.0 pg   MCHC 35.4 30.0 - 36.0 g/dL   RDW 13.3 11.5 - 15.5 %   Platelets 233 150 - 400 K/uL   nRBC 0.0 0.0 - 0.2 %  hCG, quantitative, pregnancy     Status: Abnormal   Collection Time: 03/18/19 12:15 PM  Result Value Ref Range   hCG, Beta Chain, Quant, S 25,009 (H) <5 mIU/mL   US Ob Less Than 14 Weeks With Ob Transvaginal  Result Date: 03/18/2019 CLINICAL DATA:  Pelvic pain EXAM: OBSTETRIC <14 WK [place] AND TRANSVAGINAL OB US TECHNIQUE: Both transabdominal and transvaginal ultrasound examinations were performed for complete evaluation of the gestation as well as the maternal uterus, adnexal regions, and pelvic cul-de-sac. Transvaginal  technique was performed to assess early pregnancy. COMPARISON:  None. FINDINGS: Intrauterine gestational sac: Visualized Yolk sac:  Visualized Embryo:  Visualized Cardiac Activity: Visualized Heart Rate: 112 bpm CRL:  2.4 mm   5 w   5 d  [place] EDC: November 13, 2019 Subchorionic hemorrhage:  None visualized. Maternal uterus/adnexae: Cervical os is closed. Uterus is anteverted. Right ovary measures 2.4 x 2.1 x 1.9 cm. Left ovary measures 2.9 x 2.8 x 2.5 cm. There is a focal corpus luteum in the left ovary measuring approximately 2 x 2 x 2 cm. No other extrauterine pelvic mass. There is a small amount of free pelvic fluid. IMPRESSION: Single live intrauterine gestation with estimated gestational age of 6-weeks. Corpus luteum left ovary. No other extrauterine pelvic mass. Small amount of free pelvic fluid, a finding that potentially could indicate recent ovarian cyst rupture. Electronically Signed   By: Lowella Grip III M.D.   On: 03/18/2019 13:30   Discussed results of labs and [place] with patient. Early [place] consistent with LMP, dating not changed. Tylenol 1,000mg  given in MAU prior to discharge home for abdominal pain. Patient reports pain is resolved after treatment.   Discussed reasons to return to MAU. Encouraged to continue prenatal vitamins and make appointment to initiate prenatal care. Pt stable at time of discharge.    Assessment and Plan   1. Normal IUP (intrauterine pregnancy) on prenatal ultrasound, first trimester   2. Abdominal pain during pregnancy in first trimester    Discharge home  Make initial prenatal appointment  Return to MAU as needed for reasons discussed and/or emergencies  Continue prenatal vitamins   Follow-up [place]. Schedule an appointment as soon as possible for a visit in 5 week(s).   Specialty:  Obstetrics and Gynecology Why:  Make appointment to be seen around 10 weeks for initial prenatal visit  Contact  information: [address] [place] 03/18/2019, 3:16 PM

## 2019-03-18 NOTE — Progress Notes (Signed)
Vaginal swabs done by V. Stann Mainland, CNM

## 2019-03-18 NOTE — MAU Note (Addendum)
Sharp pains in lower abd and back.  Went to the bathroom and had cramping.  Felt like she was going to pass out. No bleeding or discharge.  Been going on for like 4 days. Is almost afraid to use  Restroom because of the pain.

## 2019-03-18 NOTE — MAU Note (Signed)
Pt has on mask "for her own protection".

## 2019-03-19 LAB — CULTURE, OB URINE: Culture: 50000 — AB

## 2019-03-19 LAB — GC/CHLAMYDIA PROBE AMP (~~LOC~~) NOT AT ARMC
Chlamydia: NEGATIVE
Neisseria Gonorrhea: NEGATIVE

## 2019-03-19 LAB — HIV ANTIBODY (ROUTINE TESTING W REFLEX): HIV Screen 4th Generation wRfx: NONREACTIVE

## 2019-03-25 ENCOUNTER — Ambulatory Visit (HOSPITAL_COMMUNITY)
Admission: EM | Admit: 2019-03-25 | Discharge: 2019-03-25 | Disposition: A | Discharge status: Home / Self Care | Payer: Medicaid Other | Attending: Family Medicine | Admitting: Family Medicine

## 2019-03-25 ENCOUNTER — Encounter (HOSPITAL_COMMUNITY): Payer: Self-pay | Admitting: Emergency Medicine

## 2019-03-25 ENCOUNTER — Encounter: Payer: Self-pay | Admitting: Advanced Practice Midwife

## 2019-03-25 ENCOUNTER — Other Ambulatory Visit: Payer: Self-pay

## 2019-03-25 DIAGNOSIS — J069 Acute upper respiratory infection, unspecified: Secondary | ICD-10-CM | POA: Diagnosis not present

## 2019-03-25 DIAGNOSIS — B9789 Other viral agents as the cause of diseases classified elsewhere: Secondary | ICD-10-CM

## 2019-03-25 HISTORY — DX: Encounter for supervision of normal pregnancy, unspecified, unspecified trimester: Z34.90

## 2019-03-25 NOTE — ED Triage Notes (Signed)
3 days ago noticed dry throat, stuffy nose.  This morning started having heaviness in chest skin hot to touch per patient, but no fever.  NAD Patient has a cough. No fever No sob

## 2019-03-25 NOTE — Discharge Instructions (Signed)
Please use with some medicine safe in pregnancy for further management of your symptoms. Continue to rest and drink plenty of fluids I would expect her symptoms to gradually resolve over that next 4 to 5 days  Please follow-up if symptoms worsening, developing increased chest pressure, shortness of breath, fevers

## 2019-03-25 NOTE — ED Provider Notes (Signed)
[facility]    CSN: [id number] Arrival date & time: 03/25/19  1130     History   Chief Complaint Chief Complaint  Patient presents with  . URI    HPI Andrea Archer is a 18 y.o. female currently [redacted] weeks pregnant presenting today for evaluation of URI symptoms.  Patient states that she has had congestion, cough and sore throat.  Sore throat has been off and on.  Symptoms have been going on for a couple of days.  She notes this morning she woke up with pressure across her chest as if something is sitting on her.  She denies shortness of breath, wheezing.  Denies history of asthma.  Does admit to previous use of black in [place], but denies cigarette use.  Denies fevers.  She has not tried any over-the-counter medicines for symptoms except for Tylenol as needed. Denies any recent travel, denies any known exposure to COVID-19.  Does note her baby brother with URI symptoms and ear infection.  Denies history of hypertension, diabetes.  Denies heart history.  HPI  Past Medical History:  Diagnosis Date  . Anxiety   . Depression   . Pregnant     Patient Active Problem List   Diagnosis Date Noted  . Implanon removal 11/18/2018  . Change in bowel habits 06/18/2018  . Left lower quadrant pain 06/17/2018    Past Surgical History:  Procedure Laterality Date  . FINGER SURGERY Left    Pinky Finger  . NO PAST SURGERIES      OB History    Gravida  1   Para  0   Term  0   Preterm  0   AB  0   Living  0     SAB  0   TAB  0   Ectopic  0   Multiple  0   Live Births  0            Home Medications    Prior to Admission medications   Medication Sig Start Date End Date Taking? Authorizing Provider  Prenatal MV-Min-Fe Fum-FA-DHA (PRENATAL 1 PO) Take by mouth.   Yes [provider]    Family History Family History  Problem Relation Age of Onset  . Diabetes Maternal Grandmother   . Hypertension Maternal Grandmother   . Heart disease Maternal  Grandmother   . Fibromyalgia Maternal Grandmother   . Pancreatic cancer Maternal Grandfather   . Hypertension Mother   . Colon cancer Neg Hx   . Stomach cancer Neg Hx     Social History Social History   Tobacco Use  . Smoking status: Former Smoker    Types: Cigars  . Smokeless tobacco: Never Used  Substance Use Topics  . Alcohol use: No  . Drug use: No     Allergies   Patient has no known allergies.   Review of Systems Review of Systems  Constitutional: Negative for activity change, appetite change, chills, fatigue and fever.  HENT: Positive for congestion, rhinorrhea and sore throat. Negative for ear pain, sinus pressure and trouble swallowing.   Eyes: Negative for discharge and redness.  Respiratory: Positive for cough and chest tightness. Negative for shortness of breath.   Cardiovascular: Negative for chest pain.  Gastrointestinal: Negative for abdominal pain, diarrhea, nausea and vomiting.  Musculoskeletal: Negative for myalgias.  Skin: Negative for rash.  Neurological: Negative for dizziness, light-headedness and headaches.     Physical Exam Triage Vital Signs ED Triage Vitals  Enc Vitals  Group     BP 03/25/19 1201 102/66     Pulse Rate 03/25/19 1201 89     Resp 03/25/19 1201 18     Temp 03/25/19 1201 98.8 F (37.1 C)     Temp Source 03/25/19 1201 Oral     SpO2 03/25/19 1201 100 %     Weight --      Height --      Head Circumference --      Peak Flow --      Pain Score 03/25/19 1156 4     Pain Loc --      Pain Edu? --      Excl. in [place]? --    No data found.  Updated Vital Signs BP 102/66 (BP Location: Left Arm)   Pulse 89   Temp 98.8 F (37.1 C) (Oral)   Resp 18   LMP 02/06/2019   SpO2 100%   Visual Acuity Right Eye Distance:   Left Eye Distance:   Bilateral Distance:    Right Eye Near:   Left Eye Near:    Bilateral Near:     Physical Exam Vitals signs and nursing note reviewed.  Constitutional:      General: She is not in acute  distress.    Appearance: She is well-developed.  HENT:     Head: Normocephalic and atraumatic.     Ears:     Comments: Bilateral ears without tenderness to palpation of external auricle, tragus and mastoid, EAC's without erythema or swelling, TM's with good bony landmarks and cone of light. Non erythematous.     Nose:     Comments: Nasal mucosa slightly erythematous    Mouth/Throat:     Comments: Oral mucosa pink and moist, no tonsillar enlargement or exudate. Posterior pharynx patent and nonerythematous, no uvula deviation or swelling. Normal phonation. Eyes:     Conjunctiva/sclera: Conjunctivae normal.  Neck:     Musculoskeletal: Neck supple.  Cardiovascular:     Rate and Rhythm: Normal rate and regular rhythm.     Heart sounds: No murmur.  Pulmonary:     Effort: Pulmonary effort is normal. No respiratory distress.     Breath sounds: Normal breath sounds.     Comments: Breathing comfortably at rest, CTABL, no wheezing, rales or other adventitious sounds auscultated  No anterior chest tenderness Abdominal:     Palpations: Abdomen is soft.     Tenderness: There is no abdominal tenderness.  Skin:    General: Skin is warm and dry.  Neurological:     Mental Status: She is alert.      UC Treatments / Results  Labs (all labs ordered are listed, but only abnormal results are displayed) Labs Reviewed - No data to display  EKG None  Radiology No results found.  Procedures Procedures (including critical care time)  Medications Ordered in UC Medications - No data to display  Initial Impression / Assessment and Plan / UC Course  I have reviewed the triage vital signs and the nursing notes.  Pertinent labs & imaging results that were available during my care of the patient were reviewed by me and considered in my medical decision making (see chart for details).     URI symptoms times a couple days, vital signs stable, lungs clear, exam nonfocal.  Most likely viral URI.   Low risk for COVID-19, the patient is currently pregnant.  Will recommend symptomatic and supportive care.  Continue to monitor,Discussed strict return precautions. Patient verbalized understanding  and is agreeable with plan.  Final Clinical Impressions(s) / UC Diagnoses   Final diagnoses:  Viral URI with cough     Discharge Instructions     Please use with some medicine safe in pregnancy for further management of your symptoms. Continue to rest and drink plenty of fluids I would expect her symptoms to gradually resolve over that next 4 to 5 days  Please follow-up if symptoms worsening, developing increased chest pressure, shortness of breath, fevers   ED Prescriptions    None     Controlled Substance Prescriptions  Controlled Substance Registry consulted? Not Applicable   Janith Lima, [place] 03/25/19 1329

## 2019-04-20 ENCOUNTER — Encounter: Payer: Self-pay | Admitting: Advanced Practice Midwife

## 2019-04-20 ENCOUNTER — Ambulatory Visit (INDEPENDENT_AMBULATORY_CARE_PROVIDER_SITE_OTHER): Payer: Medicaid Other | Admitting: Advanced Practice Midwife

## 2019-04-20 ENCOUNTER — Other Ambulatory Visit: Payer: Self-pay

## 2019-04-20 VITALS — BP 108/69 | HR 72 | Wt 136.0 lb

## 2019-04-20 DIAGNOSIS — O219 Vomiting of pregnancy, unspecified: Secondary | ICD-10-CM

## 2019-04-20 DIAGNOSIS — Z3481 Encounter for supervision of other normal pregnancy, first trimester: Secondary | ICD-10-CM

## 2019-04-20 DIAGNOSIS — Z3480 Encounter for supervision of other normal pregnancy, unspecified trimester: Secondary | ICD-10-CM | POA: Insufficient documentation

## 2019-04-20 DIAGNOSIS — Z3A10 10 weeks gestation of pregnancy: Secondary | ICD-10-CM

## 2019-04-20 MED ORDER — DOXYLAMINE-PYRIDOXINE 10-10 MG PO TBEC: DELAYED_RELEASE_TABLET | ORAL | 5 refills | Status: DC

## 2019-04-20 NOTE — Patient Instructions (Signed)

## 2019-04-20 NOTE — Progress Notes (Signed)
C/O: Back pain Pain in left foot Pt states she has to force herself to eat and nausea

## 2019-04-20 NOTE — Progress Notes (Signed)
Subjective:   Andrea Archer is a 18 y.o. G1P0000 at [redacted]w[redacted]d by sure LMP being seen today for her first obstetrical visit.  Her obstetrical history is significant for none and has Left lower quadrant pain; Change in bowel habits; Implanon removal; and Supervision of other normal pregnancy, antepartum on their problem list.. Patient does intend to breast feed. Pregnancy history fully reviewed.  Patient reports nausea.  HISTORY: OB History  Gravida Para Term Preterm AB Living  1 0 0 0 0 0  SAB TAB Ectopic Multiple Live Births  0 0 0 0 0    # Outcome Date GA Lbr Len/2nd Weight Sex Delivery Anes PTL Lv  1 Current            Past Medical History:  Diagnosis Date  . Anxiety   . Depression   . Pregnant    Past Surgical History:  Procedure Laterality Date  . FINGER SURGERY Left    Pinky Finger  . NO PAST SURGERIES     Family History  Problem Relation Age of Onset  . Diabetes Maternal Grandmother   . Hypertension Maternal Grandmother   . Heart disease Maternal Grandmother   . Fibromyalgia Maternal Grandmother   . Pancreatic cancer Maternal Grandfather   . Hypertension Mother   . Colon cancer Neg Hx   . Stomach cancer Neg Hx    Social History   Tobacco Use  . Smoking status: Former Smoker    Types: Cigars  . Smokeless tobacco: Never Used  Substance Use Topics  . Alcohol use: No  . Drug use: No   No Known Allergies Current Outpatient Medications on File Prior to Visit  Medication Sig Dispense Refill  . Prenatal MV-Min-Fe Fum-FA-DHA (PRENATAL 1 PO) Take by mouth.     No current facility-administered medications on file prior to visit.      Exam   Vitals:   04/20/19 0827  BP: 108/69  Pulse: 72  Weight: 61.7 kg      Uterus:     Pelvic Exam: Perineum: no hemorrhoids, normal perineum   Vulva: normal external genitalia, no lesions   Vagina:  normal mucosa, normal discharge   Cervix: no lesions and normal, pap smear done.    Adnexa: normal adnexa and no mass,  fullness, tenderness   Bony Pelvis: average  System: General: well-developed, well-nourished female in no acute distress   Breast:  normal appearance, no masses or tenderness   Skin: normal coloration and turgor, no rashes   Neurologic: oriented, normal, negative, normal mood   Extremities: normal strength, tone, and muscle mass, ROM of all joints is normal   HEENT PERRLA, extraocular movement intact and sclera clear, anicteric   Mouth/Teeth mucous membranes moist, pharynx normal without lesions and dental hygiene good   Neck supple and no masses   Cardiovascular: regular rate and rhythm   Respiratory:  no respiratory distress, normal breath sounds   Abdomen: soft, non-tender; bowel sounds normal; no masses,  no organomegaly    Bedside US reveals normal FHR, fetal movement, size c/w LMP dates   Assessment:   Pregnancy: G1P0000 Patient Active Problem List   Diagnosis Date Noted  . Supervision of other normal pregnancy, antepartum 04/20/2019  . Implanon removal 11/18/2018  . Change in bowel habits 06/18/2018  . Left lower quadrant pain 06/17/2018     Plan:  1. Supervision of other normal pregnancy, antepartum --Anticipatory guidance about next visits/weeks of pregnancy given. --Reviewed safety, visitor policy, reassurance about COVID-19  for pregnancy at this time. Discussed possible changes to visits, including televisits, that may occur due to COVID-19.  The office remains open if pt needs to be seen and MAU is open 24 hours/day for OB emergencies.   - Obstetric Panel, Including HIV - Culture, OB Urine - Genetic Screening - Babyscripts Schedule Optimization  2. Nausea and vomiting during pregnancy prior to [redacted] weeks gestation --Discussed dietary changes. Pt lost ~6 lbs but gained it back so now at her prepregnancy weight.  Discussed appropriate weight gain of 25-35 lbs in normal weight women. - Doxylamine-Pyridoxine (DICLEGIS) 10-10 MG TBEC; Take 2 tabs at bedtime. If needed,  add another tab in the morning. If needed, add another tab in the afternoon, up to 4 tabs/day.  Dispense: 100 tablet; Refill: 5 Initial labs drawn.  Continue prenatal vitamins. Discussed and offered genetic screening options, including Quad screen/AFP, NIPS testing, and option to decline testing. Benefits/risks/alternatives reviewed. Pt aware that anatomy US is form of genetic screening with lower accuracy in detecting trisomies than blood work.  Pt chooses genetic screening today. NIPS: ordered. Ultrasound discussed; fetal anatomic survey: requested. Problem list reviewed and updated. The nature of [facility] with multiple MDs and other Advanced Practice Providers was explained to patient; also emphasized that residents, students are part of our team. Routine obstetric precautions reviewed. Return in about 4 weeks (around 05/18/2019).   Fatima Blank, CNM 04/20/19 11:20 AM

## 2019-04-20 NOTE — Progress Notes (Signed)
NOB U/S : 03/18/19 Planned: Yes and No per patient.  Genetic Screening: Yes  Last Pap: N/A due to age [id number]

## 2019-04-21 LAB — OBSTETRIC PANEL, INCLUDING HIV
Antibody Screen: NEGATIVE
Basophils Absolute: 0.1 10*3/uL (ref 0.0–0.2)
Basos: 1 %
EOS (ABSOLUTE): 0.5 10*3/uL — ABNORMAL HIGH (ref 0.0–0.4)
Eos: 5 %
HIV Screen 4th Generation wRfx: NONREACTIVE
Hematocrit: 38.5 % (ref 34.0–46.6)
Hemoglobin: 13.1 g/dL (ref 11.1–15.9)
Hepatitis B Surface Ag: NEGATIVE
Immature Grans (Abs): 0.1 10*3/uL (ref 0.0–0.1)
Immature Granulocytes: 1 %
Lymphocytes Absolute: 2.1 10*3/uL (ref 0.7–3.1)
Lymphs: 23 %
MCH: 28.4 pg (ref 26.6–33.0)
MCHC: 34 g/dL (ref 31.5–35.7)
MCV: 83 fL (ref 79–97)
Monocytes Absolute: 0.8 10*3/uL (ref 0.1–0.9)
Monocytes: 9 %
Neutrophils Absolute: 5.8 10*3/uL (ref 1.4–7.0)
Neutrophils: 61 %
Platelets: 296 10*3/uL (ref 150–450)
RBC: 4.62 x10E6/uL (ref 3.77–5.28)
RDW: 13.7 % (ref 11.7–15.4)
RPR Ser Ql: NONREACTIVE
Rh Factor: POSITIVE
Rubella Antibodies, IGG: 1.22 index (ref >0.99)
WBC: 9.4 10*3/uL (ref 3.4–10.8)

## 2019-04-22 LAB — URINE CULTURE, OB REFLEX

## 2019-04-22 LAB — CULTURE, OB URINE

## 2019-04-23 ENCOUNTER — Telehealth: Payer: Self-pay | Admitting: *Deleted

## 2019-04-23 ENCOUNTER — Other Ambulatory Visit: Payer: Self-pay | Admitting: *Deleted

## 2019-04-23 DIAGNOSIS — O219 Vomiting of pregnancy, unspecified: Secondary | ICD-10-CM

## 2019-04-23 NOTE — Telephone Encounter (Signed)
Pt called to office with question about medication. Pt made aware of pharmacy info.

## 2019-04-27 ENCOUNTER — Encounter: Payer: Self-pay | Admitting: Advanced Practice Midwife

## 2019-04-27 ENCOUNTER — Telehealth: Payer: Self-pay | Admitting: *Deleted

## 2019-04-27 NOTE — Telephone Encounter (Signed)
Pt called to office for lab results. Return call to pt.  Pt made aware of results and have been released to mychart.

## 2019-05-04 ENCOUNTER — Encounter: Payer: Self-pay | Admitting: Advanced Practice Midwife

## 2019-05-04 DIAGNOSIS — D573 Sickle-cell trait: Secondary | ICD-10-CM | POA: Insufficient documentation

## 2019-05-18 ENCOUNTER — Ambulatory Visit (INDEPENDENT_AMBULATORY_CARE_PROVIDER_SITE_OTHER): Payer: Medicaid Other | Admitting: Advanced Practice Midwife

## 2019-05-18 ENCOUNTER — Other Ambulatory Visit: Payer: Self-pay

## 2019-05-18 VITALS — BP 105/76 | HR 91

## 2019-05-18 DIAGNOSIS — Z3402 Encounter for supervision of normal first pregnancy, second trimester: Secondary | ICD-10-CM

## 2019-05-18 DIAGNOSIS — Z3A14 14 weeks gestation of pregnancy: Secondary | ICD-10-CM

## 2019-05-18 DIAGNOSIS — O219 Vomiting of pregnancy, unspecified: Secondary | ICD-10-CM

## 2019-05-18 DIAGNOSIS — Z3A19 19 weeks gestation of pregnancy: Secondary | ICD-10-CM

## 2019-05-18 NOTE — Progress Notes (Signed)
TELEHEALTH OBSTETRICS PRENATAL VIRTUAL VIDEO VISIT ENCOUNTER NOTE  Provider location: [facility] [place]   I connected with Andrea Archer on 05/18/19 at 11:25 AM EDT by WebEx Video Encounter at home and verified that I am speaking with the correct person using two identifiers.   I discussed the limitations, risks, security and privacy concerns of performing an evaluation and management service by telephone and the availability of in person appointments. I also discussed with the patient that there may be a patient responsible charge related to this service. The patient expressed understanding and agreed to proceed. Subjective:  Andrea Archer is a 18 y.o. G1P0000 at [redacted]w[redacted]d being seen today for ongoing prenatal care.  She is currently monitored for the following issues for this low-risk pregnancy and has Left lower quadrant pain; Change in bowel habits; Implanon removal; Supervision of other normal pregnancy, antepartum; and Sickle cell trait ([place]) on their problem list.  Patient reports nausea.  Contractions: Not present. Vag. Bleeding: None.   . Denies any leaking of fluid.   The following portions of the patient's history were reviewed and updated as appropriate: allergies, current medications, past family history, past medical history, past social history, past surgical history and problem list.   Objective:   Vitals:   05/18/19 1133  BP: 105/76  Pulse: 91    Fetal Status:           General:  Alert, oriented and cooperative. Patient is in no acute distress.  Respiratory: Normal respiratory effort, no problems with respiration noted  Mental Status: Normal mood and affect. Normal behavior. Normal judgment and thought content.  Rest of physical exam deferred due to type of encounter  Imaging: No results found.  Assessment and Plan:  Pregnancy: G1P0000 at [redacted]w[redacted]d  1. Encounter for supervision of normal first pregnancy in second trimester --Pt denies cramping,  LOF, or vaginal bleeding --Reviewed safety, visitor policy, reassurance about COVID-19 for pregnancy at this time. Discussed possible changes to visits, including televisits, that may occur due to COVID-19.  The office remains open if pt needs to be seen and MAU is open 24 hours/day for OB emergencies. --Next visit video visit plus lab only for AFP or in person visit for both if desired - [place] MFM OB COMP + 14 WK; Future  2. Nausea and vomiting during pregnancy prior to [redacted] weeks gestation --Poor appetite, pt feels like she has lost ~ 5 lbs. --Is keeping down water most of the time but food is difficult --Taking Diclegis 2 tabs at night, add tab in am and afternoon for 4 tabs/day.  --Nausea will likely improve before 16 or at least before 20 weeks --Pt to call office if continued weight loss or if she become dehydrated  Preterm labor symptoms and general obstetric precautions including but not limited to vaginal bleeding, contractions, leaking of fluid and fetal movement were reviewed in detail with the patient. I discussed the assessment and treatment plan with the patient. The patient was provided an opportunity to ask questions and all were answered. The patient agreed with the plan and demonstrated an understanding of the instructions. The patient was advised to call back or seek an in-person office evaluation/go to MAU at [facility] for any urgent or concerning symptoms. Please refer to After Visit Summary for other counseling recommendations.   I provided 12 minutes of face-to-face via WebEx time during this encounter.  Return in about 6 weeks (around 06/29/2019).  Future Appointments  Date  Time Provider [place]  06/19/2019 10:15 AM WH-MFC [place] 4 WH-MFCUS MFC-US    Oval Cavazos [place], [place] for [employer], [place]

## 2019-05-18 NOTE — Progress Notes (Signed)
Pt is on the phone preparing for webex visit with provider. [redacted]w[redacted]d. Pt does not have anatomy scan [place] scheduled yet.

## 2019-05-27 ENCOUNTER — Inpatient Hospital Stay (HOSPITAL_COMMUNITY)
Admission: AD | Admit: 2019-05-27 | Discharge: 2019-05-27 | Disposition: A | Discharge status: Home / Self Care | Payer: Medicaid Other | Attending: Obstetrics and Gynecology | Admitting: Obstetrics and Gynecology

## 2019-05-27 ENCOUNTER — Inpatient Hospital Stay (HOSPITAL_BASED_OUTPATIENT_CLINIC_OR_DEPARTMENT_OTHER): Payer: Medicaid Other

## 2019-05-27 ENCOUNTER — Other Ambulatory Visit: Payer: Self-pay

## 2019-05-27 ENCOUNTER — Encounter (HOSPITAL_COMMUNITY): Payer: Self-pay | Admitting: *Deleted

## 2019-05-27 DIAGNOSIS — O26892 Other specified pregnancy related conditions, second trimester: Secondary | ICD-10-CM | POA: Insufficient documentation

## 2019-05-27 DIAGNOSIS — M7989 Other specified soft tissue disorders: Secondary | ICD-10-CM | POA: Diagnosis not present

## 2019-05-27 DIAGNOSIS — R52 Pain, unspecified: Secondary | ICD-10-CM

## 2019-05-27 DIAGNOSIS — Z3480 Encounter for supervision of other normal pregnancy, unspecified trimester: Secondary | ICD-10-CM

## 2019-05-27 DIAGNOSIS — Z3492 Encounter for supervision of normal pregnancy, unspecified, second trimester: Secondary | ICD-10-CM | POA: Diagnosis not present

## 2019-05-27 DIAGNOSIS — D573 Sickle-cell trait: Secondary | ICD-10-CM | POA: Insufficient documentation

## 2019-05-27 DIAGNOSIS — M5430 Sciatica, unspecified side: Secondary | ICD-10-CM | POA: Insufficient documentation

## 2019-05-27 DIAGNOSIS — M79661 Pain in right lower leg: Secondary | ICD-10-CM | POA: Insufficient documentation

## 2019-05-27 DIAGNOSIS — M79662 Pain in left lower leg: Secondary | ICD-10-CM | POA: Insufficient documentation

## 2019-05-27 DIAGNOSIS — Z3A15 15 weeks gestation of pregnancy: Secondary | ICD-10-CM | POA: Insufficient documentation

## 2019-05-27 HISTORY — DX: Sickle-cell trait: D57.3

## 2019-05-27 NOTE — Progress Notes (Signed)
Lower extremity venous has been completed.   Preliminary results in CV Proc.   Abram Sander 05/27/2019 12:25 PM

## 2019-05-27 NOTE — MAU Provider Note (Signed)
History     CSN: [id number]  Arrival date and time: 05/27/19 1121   First Provider Initiated Contact with Patient 05/27/19 1152      Chief Complaint  Patient presents with  . Left Leg Pain   HPI Andrea Archer is a 18 y.o. G1P0000 at [redacted]w[redacted]d who presents to MAU with chief complaint of swelling in her left knee and bilateral calf pain. These are recurring problems, onset one week ago. Patient is extremely nervous as her father had a medical complication after a blood clot. She denies abdominal pain, vaginal bleeding, leaking of fluid, fever, falls, or recent illness.    OB History    Gravida  1   Para  0   Term  0   Preterm  0   AB  0   Living  0     SAB  0   TAB  0   Ectopic  0   Multiple  0   Live Births  0           Past Medical History:  Diagnosis Date  . Anxiety   . Depression   . Pregnant   . Sickle cell trait [facility])     Past Surgical History:  Procedure Laterality Date  . FINGER SURGERY Left    Pinky Finger  . NO PAST SURGERIES      Family History  Problem Relation Age of Onset  . Diabetes Maternal Grandmother   . Hypertension Maternal Grandmother   . Heart disease Maternal Grandmother   . Fibromyalgia Maternal Grandmother   . Pancreatic cancer Maternal Grandfather   . Hypertension Mother   . Sickle cell anemia Father   . Colon cancer Neg Hx   . Stomach cancer Neg Hx     Social History   Tobacco Use  . Smoking status: Former Smoker    Types: Cigars  . Smokeless tobacco: Never Used  Substance Use Topics  . Alcohol use: No  . Drug use: No    Allergies: No Known Allergies  Medications Prior to Admission  Medication Sig Dispense Refill Last Dose  . Doxylamine-Pyridoxine (DICLEGIS) 10-10 MG TBEC Take 2 tabs at bedtime. If needed, add another tab in the morning. If needed, add another tab in the afternoon, up to 4 tabs/day. 100 tablet 5 05/26/2019 at 0040  . Prenatal MV-Min-Fe Fum-FA-DHA (PRENATAL 1 PO) Take by mouth.   Taking     Review of Systems  Constitutional: Negative for chills, fatigue and fever.  Respiratory: Negative for cough, chest tightness and shortness of breath.   Cardiovascular: Positive for leg swelling. Negative for chest pain and palpitations.  Gastrointestinal: Negative for abdominal pain.  Genitourinary: Negative for vaginal bleeding, vaginal discharge and vaginal pain.  Musculoskeletal: Negative for back pain.  Neurological: Negative for headaches.  All other systems reviewed and are negative.  Physical Exam   Blood pressure 117/63, pulse 86, temperature 98.3 F (36.8 C), temperature source Oral, resp. rate 20, height 5\' 7"  (1.702 m), weight 66.8 kg, last menstrual period 02/06/2019, SpO2 100 %.  Physical Exam  Nursing note and vitals reviewed. Constitutional: She is oriented to person, place, and time. She appears well-developed and well-nourished.  Cardiovascular: Normal rate.  Respiratory: Effort normal. No respiratory distress.  GI: Soft. She exhibits no distension. There is no abdominal tenderness. There is no rebound and no guarding.  Neurological: She is alert and oriented to person, place, and time.  Skin: Skin is warm and dry.  Psychiatric: She has a  normal mood and affect. Her behavior is normal. Judgment and thought content normal.   No swelling noted on physical exam. Measurements at knee, upper calf, ankle and foot symmetrical bilaterally MAU Course  Procedures  --Reviewed physical signs and symptoms of DVT and absence of those symptoms on arrival in MAU Patient Vitals for the past 24 hrs:  BP Temp Temp src Pulse Resp SpO2 Height Weight  05/27/19 1312 110/72 98.7 F (37.1 C) Oral 74 18 98 % - -  05/27/19 1139 117/63 98.3 F (36.8 C) Oral 86 20 100 % 5\' 7"  (1.702 m) 66.8 kg    Vas [place] Lower Extremity Venous (dvt) (only [facility])  Result Date: 05/27/2019  Lower Venous Study Indications: Pain.  Performing Technologist: Abram Sander RVS  Examination Guidelines: A  complete evaluation includes B-mode imaging, spectral Doppler, color Doppler, and power Doppler as needed of all accessible portions of each vessel. Bilateral testing is considered an integral part of a complete examination. Limited examinations for reoccurring indications may be performed as noted.  +-----+---------------+---------+-----------+----------+-------+ RIGHTCompressibilityPhasicitySpontaneityPropertiesSummary +-----+---------------+---------+-----------+----------+-------+ CFV  Full           Yes      Yes                          +-----+---------------+---------+-----------+----------+-------+   +---------+---------------+---------+-----------+----------+-------+ LEFT     CompressibilityPhasicitySpontaneityPropertiesSummary +---------+---------------+---------+-----------+----------+-------+ CFV      Full           Yes      Yes                          +---------+---------------+---------+-----------+----------+-------+ SFJ      Full                                                 +---------+---------------+---------+-----------+----------+-------+ FV Prox  Full                                                 +---------+---------------+---------+-----------+----------+-------+ FV Mid   Full                                                 +---------+---------------+---------+-----------+----------+-------+ FV DistalFull                                                 +---------+---------------+---------+-----------+----------+-------+ PFV      Full                                                 +---------+---------------+---------+-----------+----------+-------+ POP      Full           Yes      Yes                          +---------+---------------+---------+-----------+----------+-------+  PTV      Full                                                 +---------+---------------+---------+-----------+----------+-------+ PERO      Full                                                 +---------+---------------+---------+-----------+----------+-------+     Summary: Right: No evidence of common femoral vein obstruction. Left: There is no evidence of deep vein thrombosis in the lower extremity. No cystic structure found in the popliteal fossa.  *See table(s) above for measurements and observations. Electronically signed by Harold Barban MD on 05/27/2019 at 1:07:16 PM.    Final    Assessment and Plan  --18 y.o. G1P0000 at [redacted]w[redacted]d  --[place] 146 by Doppler --No evidence of DVT on doppler --Discharge home in stable condition  F/U: [place] 06/29/19  Darlina Rumpf, [place] 05/27/2019, 2:13 PM

## 2019-05-27 NOTE — Discharge Instructions (Signed)

## 2019-05-27 NOTE — MAU Note (Signed)
Presents with c/o left leg pain & swelling that began last week.

## 2019-06-19 ENCOUNTER — Other Ambulatory Visit (HOSPITAL_COMMUNITY): Payer: Self-pay | Admitting: *Deleted

## 2019-06-19 ENCOUNTER — Other Ambulatory Visit: Payer: Self-pay

## 2019-06-19 ENCOUNTER — Ambulatory Visit (HOSPITAL_COMMUNITY)
Admission: RE | Admit: 2019-06-19 | Discharge: 2019-06-19 | Disposition: A | Discharge status: Home / Self Care | Payer: Medicaid Other | Source: Ambulatory Visit | Attending: Obstetrics and Gynecology | Admitting: Obstetrics and Gynecology

## 2019-06-19 DIAGNOSIS — Z363 Encounter for antenatal screening for malformations: Secondary | ICD-10-CM | POA: Diagnosis not present

## 2019-06-19 DIAGNOSIS — Z3402 Encounter for supervision of normal first pregnancy, second trimester: Secondary | ICD-10-CM

## 2019-06-19 DIAGNOSIS — Z3A19 19 weeks gestation of pregnancy: Secondary | ICD-10-CM | POA: Insufficient documentation

## 2019-06-19 DIAGNOSIS — Z362 Encounter for other antenatal screening follow-up: Secondary | ICD-10-CM

## 2019-06-19 DIAGNOSIS — Z862 Personal history of diseases of the blood and blood-forming organs and certain disorders involving the immune mechanism: Secondary | ICD-10-CM | POA: Diagnosis not present

## 2019-06-29 ENCOUNTER — Ambulatory Visit (INDEPENDENT_AMBULATORY_CARE_PROVIDER_SITE_OTHER): Payer: Medicaid Other | Admitting: Advanced Practice Midwife

## 2019-06-29 ENCOUNTER — Other Ambulatory Visit: Payer: Self-pay

## 2019-06-29 VITALS — BP 112/68 | HR 75 | Wt 150.2 lb

## 2019-06-29 DIAGNOSIS — N63 Unspecified lump in unspecified breast: Secondary | ICD-10-CM

## 2019-06-29 DIAGNOSIS — Z3480 Encounter for supervision of other normal pregnancy, unspecified trimester: Secondary | ICD-10-CM

## 2019-06-29 DIAGNOSIS — Z3A20 20 weeks gestation of pregnancy: Secondary | ICD-10-CM

## 2019-06-29 DIAGNOSIS — O26892 Other specified pregnancy related conditions, second trimester: Secondary | ICD-10-CM

## 2019-06-29 NOTE — Patient Instructions (Signed)

## 2019-06-29 NOTE — Progress Notes (Signed)
Patient reports fetal movement. Pt complains of lump under right arm that is a little painful, pt also states that she stopped PNV because they were making her sick.

## 2019-06-29 NOTE — Progress Notes (Signed)
   PRENATAL VISIT NOTE  Subjective:  Andrea Archer is a 18 y.o. G1P0000 at [redacted]w[redacted]d being seen today for ongoing prenatal care.  She is currently monitored for the following issues for this low-risk pregnancy and has Left lower quadrant pain; Change in bowel habits; Implanon removal; Supervision of other normal pregnancy, antepartum; and Sickle cell trait ([place]) on their problem list.  Patient reports nausea with PNV so stopped taking.  Contractions: Not present. Vag. Bleeding: None.  Movement: Present. Denies leaking of fluid.   The following portions of the patient's history were reviewed and updated as appropriate: allergies, current medications, past family history, past medical history, past social history, past surgical history and problem list.   Objective:   Vitals:   06/29/19 0934  BP: 112/68  Pulse: 75  Weight: 150 lb 3.2 oz (68.1 kg)    Fetal Status: Fetal Heart Rate (bpm): 152   Movement: Present     General:  Alert, oriented and cooperative. Patient is in no acute distress.  Skin: Skin is warm and dry. No rash noted.   Cardiovascular: Normal heart rate noted  Respiratory: Normal respiratory effort, no problems with respiration noted  Abdomen: Soft, gravid, appropriate for gestational age.  Pain/Pressure: Absent     Pelvic: Cervical exam deferred        Extremities: Normal range of motion.  Edema: None  Mental Status: Normal mood and affect. Normal behavior. Normal judgment and thought content.   Assessment and Plan:  Pregnancy: G1P0000 at [redacted]w[redacted]d 1. Supervision of other normal pregnancy, antepartum --Anticipatory guidance about next visits/weeks of pregnancy given. --Reviewed safety, visitor policy, reassurance about COVID-19 for pregnancy at this time. Discussed possible changes to visits, including televisits, that may occur due to COVID-19.  The office remains open if pt needs to be seen and MAU is open 24 hours/day for OB emergencies.   - AFP, Serum, Open Spina Bifida   2. Breast lump in upper outer quadrant --Enlarged breast area, soft, tender to palpation in upper outer quadrant of right breast leading into axillary area.  No evidence of folliculitis, not fluctuant. Most likely lactational changes and/or poor fitting bra. --Pt to get more comfortable bra, nursing bra. Use ice or heat for comfort and Tylenol PRN. --F/U with office if worsening  Preterm labor symptoms and general obstetric precautions including but not limited to vaginal bleeding, contractions, leaking of fluid and fetal movement were reviewed in detail with the patient. Please refer to After Visit Summary for other counseling recommendations.   Return in about 6 weeks (around 08/10/2019).  Future Appointments  Date Time Provider [place]  07/24/2019 10:45 AM WH-MFC [place] 2 WH-MFCUS MFC-US    Fatima Blank, CNM

## 2019-07-02 LAB — AFP, SERUM, OPEN SPINA BIFIDA
AFP MoM: 1.14
AFP Value: 72.5 ng/mL
Gest. Age on Collection Date: 20.3 weeks
Maternal Age At EDD: 20.8 yr
OSBR Risk 1 IN: 10000
Test Results:: NEGATIVE
Weight: 150 [lb_av]

## 2019-07-24 ENCOUNTER — Other Ambulatory Visit: Payer: Self-pay

## 2019-07-24 ENCOUNTER — Ambulatory Visit (HOSPITAL_COMMUNITY)
Admission: RE | Admit: 2019-07-24 | Discharge: 2019-07-24 | Disposition: A | Discharge status: Home / Self Care | Payer: Medicaid Other | Source: Ambulatory Visit | Attending: Obstetrics and Gynecology | Admitting: Obstetrics and Gynecology

## 2019-07-24 DIAGNOSIS — Z3A24 24 weeks gestation of pregnancy: Secondary | ICD-10-CM | POA: Diagnosis not present

## 2019-07-24 DIAGNOSIS — Z862 Personal history of diseases of the blood and blood-forming organs and certain disorders involving the immune mechanism: Secondary | ICD-10-CM

## 2019-07-24 DIAGNOSIS — Z362 Encounter for other antenatal screening follow-up: Secondary | ICD-10-CM

## 2019-08-10 ENCOUNTER — Ambulatory Visit (INDEPENDENT_AMBULATORY_CARE_PROVIDER_SITE_OTHER): Payer: Medicaid Other | Admitting: Nurse Practitioner

## 2019-08-10 ENCOUNTER — Other Ambulatory Visit: Payer: Medicaid Other

## 2019-08-10 ENCOUNTER — Other Ambulatory Visit: Payer: Self-pay

## 2019-08-10 VITALS — BP 120/80 | HR 90 | Wt 162.0 lb

## 2019-08-10 DIAGNOSIS — D573 Sickle-cell trait: Secondary | ICD-10-CM

## 2019-08-10 DIAGNOSIS — Z3480 Encounter for supervision of other normal pregnancy, unspecified trimester: Secondary | ICD-10-CM

## 2019-08-10 DIAGNOSIS — Z3A26 26 weeks gestation of pregnancy: Secondary | ICD-10-CM

## 2019-08-10 DIAGNOSIS — Z3482 Encounter for supervision of other normal pregnancy, second trimester: Secondary | ICD-10-CM

## 2019-08-10 NOTE — Patient Instructions (Signed)

## 2019-08-10 NOTE — Progress Notes (Signed)
Decline tdap

## 2019-08-10 NOTE — Progress Notes (Signed)
    Subjective:  Andrea Archer is a 18 y.o. G1P0000 at [redacted]w[redacted]d being seen today for ongoing prenatal care.  She is currently monitored for the following issues for this low-risk pregnancy and has Left lower quadrant pain; Change in bowel habits; Supervision of other normal pregnancy, antepartum; and Sickle cell trait ([place]) on their problem list.  Patient reports no complaints.  Contractions: Not present.  .  Movement: Present. Denies leaking of fluid. Was seen in MAU for swollen legs - has resolved now.  No problems.  The following portions of the patient's history were reviewed and updated as appropriate: allergies, current medications, past family history, past medical history, past social history, past surgical history and problem list. Problem list updated.  Objective:   Vitals:   08/10/19 0817  BP: 120/80  Pulse: 90  Weight: 162 lb (73.5 kg)    Fetal Status: Fetal Heart Rate (bpm): 154 Fundal Height: 26 cm Movement: Present     General:  Alert, oriented and cooperative. Patient is in no acute distress.  Skin: Skin is warm and dry. No rash noted.   Cardiovascular: Normal heart rate noted  Respiratory: Normal respiratory effort, no problems with respiration noted  Abdomen: Soft, gravid, appropriate for gestational age. Pain/Pressure: Absent     Pelvic:  Cervical exam deferred        Extremities: Normal range of motion.  Edema: None  Mental Status: Normal mood and affect. Normal behavior. Normal judgment and thought content.   Urinalysis:      Assessment and Plan:  Pregnancy: G1P0000 at [redacted]w[redacted]d  1. Supervision of other normal pregnancy, antepartum Discussed online childbirth and breastfeeding classes. Has not used Babyscripts - education given in office today - check in 2 weeks to see if she is using BP cuff at home.  2. Sickle cell trait ([place]) Urine culture today  Reports FOB does not have trait  Preterm labor symptoms and general obstetric precautions including but not  limited to vaginal bleeding, contractions, leaking of fluid and fetal movement were reviewed in detail with the patient. Please refer to After Visit Summary for other counseling recommendations.  Return in about 2 weeks (around 08/24/2019) for in person visit.  Earlie Server, RN, MSN, NP-BC Nurse Practitioner, [facility] for [employer], [place] [facility] 08/10/2019 8:58 AM

## 2019-08-11 LAB — CBC
Hematocrit: 35.8 % (ref 34.0–46.6)
Hemoglobin: 11.8 g/dL (ref 11.1–15.9)
MCH: 28.2 pg (ref 26.6–33.0)
MCHC: 33 g/dL (ref 31.5–35.7)
MCV: 85 fL (ref 79–97)
Platelets: 237 10*3/uL (ref 150–450)
RBC: 4.19 x10E6/uL (ref 3.77–5.28)
RDW: 12.8 % (ref 11.7–15.4)
WBC: 12.3 10*3/uL — ABNORMAL HIGH (ref 3.4–10.8)

## 2019-08-11 LAB — GLUCOSE TOLERANCE, 2 HOURS W/ 1HR
Glucose, 1 hour: 104 mg/dL (ref 65–179)
Glucose, 2 hour: 61 mg/dL — ABNORMAL LOW (ref 65–152)
Glucose, Fasting: 76 mg/dL (ref 65–91)

## 2019-08-11 LAB — RPR: RPR Ser Ql: NONREACTIVE

## 2019-08-11 LAB — HIV ANTIBODY (ROUTINE TESTING W REFLEX): HIV Screen 4th Generation wRfx: NONREACTIVE

## 2019-08-12 LAB — CULTURE, OB URINE

## 2019-08-12 LAB — URINE CULTURE, OB REFLEX

## 2019-08-18 ENCOUNTER — Other Ambulatory Visit: Payer: Self-pay

## 2019-08-18 DIAGNOSIS — Z3480 Encounter for supervision of other normal pregnancy, unspecified trimester: Secondary | ICD-10-CM

## 2019-08-18 MED ORDER — MISC. DEVICES MISC: 0 refills | Status: DC

## 2019-08-25 ENCOUNTER — Other Ambulatory Visit: Payer: Self-pay

## 2019-08-25 ENCOUNTER — Ambulatory Visit (INDEPENDENT_AMBULATORY_CARE_PROVIDER_SITE_OTHER): Payer: Medicaid Other | Admitting: Advanced Practice Midwife

## 2019-08-25 VITALS — BP 112/75 | HR 83 | Wt 169.4 lb

## 2019-08-25 DIAGNOSIS — D573 Sickle-cell trait: Secondary | ICD-10-CM

## 2019-08-25 DIAGNOSIS — Z3A28 28 weeks gestation of pregnancy: Secondary | ICD-10-CM

## 2019-08-25 DIAGNOSIS — O99019 Anemia complicating pregnancy, unspecified trimester: Secondary | ICD-10-CM

## 2019-08-25 DIAGNOSIS — Z3480 Encounter for supervision of other normal pregnancy, unspecified trimester: Secondary | ICD-10-CM

## 2019-08-25 DIAGNOSIS — O99013 Anemia complicating pregnancy, third trimester: Secondary | ICD-10-CM

## 2019-08-25 NOTE — Patient Instructions (Signed)
Cervical Ripening: May try one or both AFTER 37 weeks  Red Raspberry Leaf capsules:  two 300mg  or 400mg  tablets with each meal, 2-3 times a day  Potential Side Effects Of Raspberry Leaf:  Most women do not experience any side effects from drinking raspberry leaf tea. However, nausea and loose stools are possible     Evening Primrose Oil capsules: may take 1 to 3 capsules daily. May also prick one to release the oil and insert it into your vagina at night.  Some of the potential side effects:  Upset stomach  Loose stools or diarrhea  Headaches  Nausea

## 2019-08-25 NOTE — Progress Notes (Signed)
   PRENATAL VISIT NOTE  Subjective:  Andrea Archer is a 18 y.o. G1P0000 at [redacted]w[redacted]d being seen today for ongoing prenatal care.  She is currently monitored for the following issues for this low-risk pregnancy and has Left lower quadrant pain; Change in bowel habits; Supervision of other normal pregnancy, antepartum; and Sickle cell trait ([place]) on their problem list.  Patient reports no complaints.  Contractions: Not present. Vag. Bleeding: None.  Movement: Present. Denies leaking of fluid.   The following portions of the patient's history were reviewed and updated as appropriate: allergies, current medications, past family history, past medical history, past social history, past surgical history and problem list.   Objective:   Vitals:   08/25/19 1005  BP: 112/75  Pulse: 83  Weight: 169 lb 6.4 oz (76.8 kg)    Fetal Status: Fetal Heart Rate (bpm): 145   Movement: Present     General:  Alert, oriented and cooperative. Patient is in no acute distress.  Skin: Skin is warm and dry. No rash noted.   Cardiovascular: Normal heart rate noted  Respiratory: Normal respiratory effort, no problems with respiration noted  Abdomen: Soft, gravid, appropriate for gestational age.  Pain/Pressure: Absent     Pelvic: Cervical exam deferred        Extremities: Normal range of motion.  Edema: None  Mental Status: Normal mood and affect. Normal behavior. Normal judgment and thought content.   Assessment and Plan:  Pregnancy: G1P0000 at [redacted]w[redacted]d 1. Sickle cell trait in mother affecting pregnancy ([place])  - Culture, OB Urine  2. Supervision of other normal pregnancy, antepartum --Anticipatory guidance about next visits/weeks of pregnancy given.  --Reviewed safety, visitor policy, and COVID testing for scheduled IOL or and C/S and for active labor admission.  Reassurance about COVID-19 with young healthy women according to current data. Discussed possible changes to visits, including televisits, that may occur  due to COVID-19.  The office remains open if pt needs to be seen and MAU is open 24 hours/day for OB emergencies.  Preterm labor symptoms and general obstetric precautions including but not limited to vaginal bleeding, contractions, leaking of fluid and fetal movement were reviewed in detail with the patient. Please refer to After Visit Summary for other counseling recommendations.   No follow-ups on file.  Future Appointments  Date Time Provider [place]  09/22/2019  9:15 AM Shelly Bombard, MD [place] None    Fatima Blank, CNM

## 2019-08-25 NOTE — Progress Notes (Signed)
Pt is here for ROB. [redacted]w[redacted]d.

## 2019-08-28 LAB — URINE CULTURE, OB REFLEX

## 2019-08-28 LAB — CULTURE, OB URINE

## 2019-09-15 ENCOUNTER — Telehealth: Payer: Self-pay

## 2019-09-15 NOTE — Telephone Encounter (Signed)
Pt called regarding cold sx's  Pt made aware of safe OTC medications list and advised is sx's do not improve to contact PCP or report to the hospital Pt advised pt are not allowed in the office if they present w/ cold/flu sx's.   Pt agreeable and voiced understanding.

## 2019-09-22 ENCOUNTER — Telehealth: Payer: Medicaid Other | Admitting: Obstetrics

## 2019-09-23 ENCOUNTER — Telehealth (INDEPENDENT_AMBULATORY_CARE_PROVIDER_SITE_OTHER): Payer: Medicaid Other | Admitting: Advanced Practice Midwife

## 2019-09-23 ENCOUNTER — Encounter: Payer: Self-pay | Admitting: Advanced Practice Midwife

## 2019-09-23 DIAGNOSIS — O4703 False labor before 37 completed weeks of gestation, third trimester: Secondary | ICD-10-CM

## 2019-09-23 DIAGNOSIS — Z3480 Encounter for supervision of other normal pregnancy, unspecified trimester: Secondary | ICD-10-CM

## 2019-09-23 DIAGNOSIS — O479 False labor, unspecified: Secondary | ICD-10-CM

## 2019-09-23 DIAGNOSIS — Z3A32 32 weeks gestation of pregnancy: Secondary | ICD-10-CM

## 2019-09-23 NOTE — Progress Notes (Signed)
   Andrea Archer  Provider location: [facility] [place]   I connected with [name] on 09/23/19 at  9:55 AM EDT by MyChart Video Encounter at home and verified that I am speaking with the correct person using two identifiers.   I discussed the limitations, risks, security and privacy concerns of performing an evaluation and management service virtually and the availability of in person appointments. I also discussed with the patient that there may be a patient responsible charge related to this service. The patient expressed understanding and agreed to proceed. Subjective:  Andrea Archer is a 18 y.o. G1P0000 at [redacted]w[redacted]d being seen today for ongoing prenatal care.  She is currently monitored for the following issues for this low-risk pregnancy and has Left lower quadrant pain; Change in bowel habits; Supervision of other normal pregnancy, antepartum; and Sickle cell trait ([place]) on their problem list.  Patient reports occasional contractions.  Contractions: Irritability. Vag. Bleeding: None.  Movement: Present. Denies any leaking of fluid.   The following portions of the patient's history were reviewed and updated as appropriate: allergies, current medications, past family history, past medical history, past social history, past surgical history and problem list.   Objective:  There were no vitals filed for this visit.  Fetal Status:     Movement: Present     General:  Alert, oriented and cooperative. Patient is in no acute distress.  Respiratory: Normal respiratory effort, no problems with respiration noted  Mental Status: Normal mood and affect. Normal behavior. Normal judgment and thought content.  Rest of physical exam deferred due to type of encounter  Imaging: No results found.  Assessment and Plan:  Pregnancy: G1P0000 at [redacted]w[redacted]d 1. Supervision of other normal pregnancy, antepartum --Pt reports good fetal  movement, denies cramping, LOF, or vaginal bleeding --Anticipatory guidance about next visits/weeks of pregnancy given. --Pt desires birth plan, discussed return of waterbirth and classes.  Went over birth plan with pt, pt to write up short birth plan and bring to 36 week visit to review.  --Next visit at 36 weeks, in person for GBS. Needs TDAP, discussed and pt desires.  2. Braxton Hicks contractions --Occasional painful cramping episodes, none today.  Less than 4-5 times per hour when they occur.  Good fetal movement. --Rest/ice/heat/warm bath/Tylenol/pregnancy support belt --Pt has pregnancy support belt but is not wearing daily, encouraged her to use it all day long, take off at night  Preterm labor symptoms and general obstetric precautions including but not limited to vaginal bleeding, contractions, leaking of fluid and fetal movement were reviewed in detail with the patient. I discussed the assessment and treatment plan with the patient. The patient was provided an opportunity to ask questions and all were answered. The patient agreed with the plan and demonstrated an understanding of the instructions. The patient was advised to call back or seek an in-person office evaluation/go to MAU at [facility] for any urgent or concerning symptoms. Please refer to After Visit Summary for other counseling recommendations.   I provided 15 minutes of face-to-face time during this encounter.  Return in about 26 days (around 10/19/2019).  No future appointments.  Fatima Blank, [place] for [employer], [place]

## 2019-09-23 NOTE — Progress Notes (Signed)
ROB

## 2019-10-19 ENCOUNTER — Ambulatory Visit (INDEPENDENT_AMBULATORY_CARE_PROVIDER_SITE_OTHER): Payer: Medicaid Other | Admitting: Advanced Practice Midwife

## 2019-10-19 ENCOUNTER — Other Ambulatory Visit: Payer: Self-pay

## 2019-10-19 ENCOUNTER — Other Ambulatory Visit (HOSPITAL_COMMUNITY)
Admission: RE | Admit: 2019-10-19 | Discharge: 2019-10-19 | Disposition: A | Discharge status: Home / Self Care | Payer: Medicaid Other | Source: Ambulatory Visit | Attending: Advanced Practice Midwife | Admitting: Advanced Practice Midwife

## 2019-10-19 VITALS — BP 131/82 | HR 90 | Wt 187.0 lb

## 2019-10-19 DIAGNOSIS — H539 Unspecified visual disturbance: Secondary | ICD-10-CM

## 2019-10-19 DIAGNOSIS — D573 Sickle-cell trait: Secondary | ICD-10-CM

## 2019-10-19 DIAGNOSIS — Z23 Encounter for immunization: Secondary | ICD-10-CM

## 2019-10-19 DIAGNOSIS — Z3403 Encounter for supervision of normal first pregnancy, third trimester: Secondary | ICD-10-CM

## 2019-10-19 NOTE — Patient Instructions (Signed)
Labor Precautions Reasons to come to [facility]:  1.  Contractions are  5 minutes apart or less, each last 1 minute, these have been going on for 1-2 hours, and you cannot walk or talk during them 2.  You have a large gush of fluid, or a trickle of fluid that will not stop and you have to wear a pad 3.  You have bleeding that is bright red, heavier than spotting--like menstrual bleeding (spotting can be normal in early labor or after a check of your cervix) 4.  You do not feel the baby moving like he/she normally does  

## 2019-10-19 NOTE — Progress Notes (Signed)
Pt notes blurry vision at times last episode was yesterday . Denies any constant headaches. Tdap and Flu today.  Pt wants cervix check

## 2019-10-19 NOTE — Progress Notes (Signed)
PRENATAL VISIT NOTE  Subjective:  Andrea Archer is a 18 y.o. G1P0000 at 26w3dbeing seen today for ongoing prenatal care.  She is currently monitored for the following issues for this low-risk pregnancy and has Left lower quadrant pain; Change in bowel habits; Supervision of other normal pregnancy, antepartum; and Sickle cell trait ([place] on their problem list.  Patient reports carpal tunnel symptoms, occasional contractions and vision changes.  Contractions: Irritability. Vag. Bleeding: None.  Movement: Present. Denies leaking of fluid.   She first noticed vision changes while watching television. She describes white spots and blurry vision when she has these episodes. They persist for a couple of minutes and then resolve. The episodes happen every couple of days. She denies headaches, abdominal pain, SOB, or chest pain.   She has had bilateral radial wrist pain for many weeks and is being followed by another provider for these complaints. She currently has a cast on her left wrist.  The following portions of the patient's history were reviewed and updated as appropriate: allergies, current medications, past family history, past medical history, past social history, past surgical history and problem list.   Objective:   Vitals:   10/19/19 0935  BP: 131/82  Pulse: 90  Weight: 187 lb (84.8 kg)    Fetal Status: Fetal Heart Rate (bpm): 154 Fundal Height: 36 cm Movement: Present  Presentation: Vertex  General:  Alert, oriented and cooperative. Patient is in no acute distress.  Skin: Skin is warm and dry. No rash noted.   Cardiovascular: Normal heart rate noted  Respiratory: Normal respiratory effort, no problems with respiration noted  Abdomen: Soft, gravid, appropriate for gestational age.  Pain/Pressure: Present     Pelvic: Cervical exam performed Dilation: Closed Effacement (%): 50 Station: -2  Extremities: Normal range of motion.  Edema: Trace  Mental Status: Normal mood and affect.  Normal behavior. Normal judgment and thought content.   Assessment and Plan:  Pregnancy: G1P0000 at 320w3d1. Encounter for supervision of normal first pregnancy in third trimester Andrea Archer's pregnancy is progressing appropriately. We discussed routine lab exams and her birth plan during today's visit.  - Strep Gp B NAA - Cervicovaginal ancillary only( [place]) - Flu Vaccine QUAD 36+ mos IM - Tdap vaccine greater than or equal to 7yo IM  2. Visual disturbances Though she denies headaches, chest pain, N/V, SOB, RUQ pain, or other s/s of pre-eclampsia and her BP was wnl but near the upper limit in the office, we will order pre-eclampsia workup due to her visual disturbances. She was advised to return or proceed to the ER if she experiences intractable headaches or N/V, chest pain, SOB, RUQ pain, or significant increase in peripheral edema.  - CBC - Comp Met (CMET) - Protein / creatinine ratio, urine  3. Sickle cell trait ([facility]) - Culture, OB Urine  4. Carpal Tunnel Symptoms She described radial wrist pain bilaterally and is being followed by another provider for this. She has a cast on the left hand and we recommended ibuprofen after delivery for pain management.    Term labor symptoms and general obstetric precautions including but not limited to vaginal bleeding, contractions, leaking of fluid and fetal movement were reviewed in detail with the patient. Please refer to After Visit Summary for other counseling recommendations.     Return in about 2 weeks (around 11/02/2019).  Future Appointments  Date Time Provider [place]11/01/2019 10:00 AM HaShelly BombardMD [facility] None    AnVicente Males  Achilles Dunk, Medical Student

## 2019-10-20 LAB — COMPREHENSIVE METABOLIC PANEL
ALT: 18 IU/L (ref 0–32)
AST: 26 IU/L (ref 0–40)
Albumin/Globulin Ratio: 1.5 (ref 1.2–2.2)
Albumin: 3.8 g/dL — ABNORMAL LOW (ref 3.9–5.0)
Alkaline Phosphatase: 145 IU/L — ABNORMAL HIGH (ref 39–117)
BUN/Creatinine Ratio: 9 (ref 9–23)
BUN: 7 mg/dL (ref 6–20)
Bilirubin Total: 0.3 mg/dL (ref 0.0–1.2)
CO2: 20 mmol/L (ref 20–29)
Calcium: 9.9 mg/dL (ref 8.7–10.2)
Chloride: 103 mmol/L (ref 96–106)
Creatinine, Ser: 0.74 mg/dL (ref 0.57–1.00)
GFR calc Af Amer: 135 mL/min/{1.73_m2} (ref >59)
GFR calc non Af Amer: 117 mL/min/{1.73_m2} (ref >59)
Globulin, Total: 2.5 g/dL (ref 1.5–4.5)
Glucose: 61 mg/dL — ABNORMAL LOW (ref 65–99)
Potassium: 4.1 mmol/L (ref 3.5–5.2)
Sodium: 136 mmol/L (ref 134–144)
Total Protein: 6.3 g/dL (ref 6.0–8.5)

## 2019-10-20 LAB — CBC
Hematocrit: 34.6 % (ref 34.0–46.6)
Hemoglobin: 11.2 g/dL (ref 11.1–15.9)
MCH: 26.2 pg — ABNORMAL LOW (ref 26.6–33.0)
MCHC: 32.4 g/dL (ref 31.5–35.7)
MCV: 81 fL (ref 79–97)
Platelets: 243 10*3/uL (ref 150–450)
RBC: 4.27 x10E6/uL (ref 3.77–5.28)
RDW: 14 % (ref 11.7–15.4)
WBC: 13.5 10*3/uL — ABNORMAL HIGH (ref 3.4–10.8)

## 2019-10-20 LAB — PROTEIN / CREATININE RATIO, URINE
Creatinine, Urine: 72.4 mg/dL
Protein, Ur: 10.3 mg/dL
Protein/Creat Ratio: 142 mg/g creat (ref 0–200)

## 2019-10-21 LAB — STREP GP B NAA: Strep Gp B NAA: NEGATIVE

## 2019-10-22 LAB — CERVICOVAGINAL ANCILLARY ONLY
Bacterial Vaginitis (gardnerella): NEGATIVE
Candida Glabrata: NEGATIVE
Candida Vaginitis: NEGATIVE
Chlamydia: NEGATIVE
Comment: NEGATIVE
Comment: NEGATIVE
Comment: NEGATIVE
Comment: NEGATIVE
Comment: NEGATIVE
Comment: NORMAL
Neisseria Gonorrhea: NEGATIVE
Trichomonas: NEGATIVE

## 2019-10-22 LAB — URINE CULTURE, OB REFLEX

## 2019-10-22 LAB — CULTURE, OB URINE

## 2019-10-23 NOTE — Telephone Encounter (Signed)
Labs are normal.

## 2019-11-02 ENCOUNTER — Telehealth (INDEPENDENT_AMBULATORY_CARE_PROVIDER_SITE_OTHER): Payer: Medicaid Other | Admitting: Obstetrics

## 2019-11-02 ENCOUNTER — Encounter: Payer: Self-pay | Admitting: Obstetrics

## 2019-11-02 DIAGNOSIS — Z3A38 38 weeks gestation of pregnancy: Secondary | ICD-10-CM

## 2019-11-02 DIAGNOSIS — D573 Sickle-cell trait: Secondary | ICD-10-CM

## 2019-11-02 DIAGNOSIS — Z3480 Encounter for supervision of other normal pregnancy, unspecified trimester: Secondary | ICD-10-CM

## 2019-11-02 DIAGNOSIS — Z3483 Encounter for supervision of other normal pregnancy, third trimester: Secondary | ICD-10-CM

## 2019-11-02 NOTE — Progress Notes (Signed)
   TELEHEALTH OBSTETRICS PRENATAL VIRTUAL VIDEO VISIT ENCOUNTER NOTE  Provider location: [facility] [place] [facility] [place]   I connected with Andrea Archer on 11/02/19 at 10:00 AM EST by OB MyChart Video Encounter at home and verified that I am speaking with the correct person using two identifiers.   I discussed the limitations, risks, security and privacy concerns of performing an evaluation and management service virtually and the availability of in person appointments. I also discussed with the patient that there may be a patient responsible charge related to this service. The patient expressed understanding and agreed to proceed.  Subjective:  Andrea Archer is a 18 y.o. G1P0000 at [redacted]w[redacted]d being seen today for ongoing prenatal care.  She is currently monitored for the following issues for this low-risk pregnancy and has Left lower quadrant pain; Change in bowel habits; Supervision of other normal pregnancy, antepartum; and Sickle cell trait ([place]) on their problem list.  Patient reports no complaints.  Contractions: Not present. Vag. Bleeding: None.  Movement: Present. Denies any leaking of fluid.   The following portions of the patient's history were reviewed and updated as appropriate: allergies, current medications, past family history, past medical history, past social history, past surgical history and problem list.   Objective:  There were no vitals filed for this visit.  Fetal Status:     Movement: Present     General:  Alert, oriented and cooperative. Patient is in no acute distress.  Respiratory: Normal respiratory effort, no problems with respiration noted  Mental Status: Normal mood and affect. Normal behavior. Normal judgment and thought content.  Rest of physical exam deferred due to type of encounter  Imaging: No results found.  Assessment and Plan:  Pregnancy: G1P0000 at [redacted]w[redacted]d 1. Supervision of other normal pregnancy, antepartum   Term labor symptoms and general  obstetric precautions including but not limited to vaginal bleeding, contractions, leaking of fluid and fetal movement were reviewed in detail with the patient. I discussed the assessment and treatment plan with the patient. The patient was provided an opportunity to ask questions and all were answered. The patient agreed with the plan and demonstrated an understanding of the instructions. The patient was advised to call back or seek an in-person office evaluation/go to MAU at [facility] for any urgent or concerning symptoms. Please refer to After Visit Summary for other counseling recommendations.   I provided 15 minutes of face-to-face time during this encounter.  Return in about 1 week (around 11/09/2019) for [place] - Midwife request.   Baltazar Najjar, [place] for [place], [place] [facility] 11/02/2019

## 2019-11-02 NOTE — Progress Notes (Signed)
S/w patient for mychart visit. Pt reports good fetal movement and irregular contractions.

## 2019-11-09 ENCOUNTER — Other Ambulatory Visit: Payer: Self-pay

## 2019-11-09 ENCOUNTER — Ambulatory Visit (INDEPENDENT_AMBULATORY_CARE_PROVIDER_SITE_OTHER): Payer: Medicaid Other | Admitting: Advanced Practice Midwife

## 2019-11-09 VITALS — BP 131/80 | HR 89 | Wt 197.0 lb

## 2019-11-09 DIAGNOSIS — Z3A39 39 weeks gestation of pregnancy: Secondary | ICD-10-CM

## 2019-11-09 DIAGNOSIS — Z3480 Encounter for supervision of other normal pregnancy, unspecified trimester: Secondary | ICD-10-CM

## 2019-11-09 DIAGNOSIS — Z3483 Encounter for supervision of other normal pregnancy, third trimester: Secondary | ICD-10-CM

## 2019-11-09 NOTE — Patient Instructions (Signed)
Labor Precautions Reasons to come to MAU at [facility]:  1.  Contractions are  5 minutes apart or less, each last 1 minute, these have been going on for 1-2 hours, and you cannot walk or talk during them 2.  You have a large gush of fluid, or a trickle of fluid that will not stop and you have to wear a pad 3.  You have bleeding that is bright red, heavier than spotting--like menstrual bleeding (spotting can be normal in early labor or after a check of your cervix) 4.  You do not feel the baby moving like he/she normally does  

## 2019-11-09 NOTE — Progress Notes (Signed)
   PRENATAL VISIT NOTE  Subjective:  Andrea Archer is a 18 y.o. G1P0000 at [redacted]w[redacted]d being seen today for ongoing prenatal care.  She is currently monitored for the following issues for this low-risk pregnancy and has Left lower quadrant pain; Change in bowel habits; Supervision of other normal pregnancy, antepartum; and Sickle cell trait ([place]) on their problem list.  Patient reports experiencing irregular contractions.  Contractions: Not present. Vag. Bleeding: None.  Movement: Present. Denies leaking of fluid.   The following portions of the patient's history were reviewed and updated as appropriate: allergies, current medications, past family history, past medical history, past social history, past surgical history and problem list.   Objective:   Vitals:   11/09/19 1003  BP: 131/80  Pulse: 89  Weight: 197 lb (89.4 kg)    Fetal Status:   Fundal Height: 38 cm Movement: Present  Presentation: Vertex  General:  Alert, oriented and cooperative. Patient is in no acute distress.  Skin: Skin is warm and dry. No rash noted.   Cardiovascular: Normal heart rate noted  Respiratory: Normal respiratory effort, no problems with respiration noted  Abdomen: Soft, gravid, appropriate for gestational age.  Pain/Pressure: Present     Pelvic: Cervical exam performed Dilation: Fingertip Effacement (%): 50 Station: -2  Extremities: Normal range of motion.  Edema: Trace  Mental Status: Normal mood and affect. Normal behavior. Normal judgment and thought content.   Assessment and Plan:  Pregnancy: G1P0000 at [redacted]w[redacted]d  1. Supervision of other normal pregnancy, antepartum [name]'s pregnancy continues to progress appropriately. Patient endorses increased contractions as she approaches her due date. Today she expresses interest in natural techniques to prompt cervical dilation. Patient refused cervical sweeping today due to fears of discomfort, but does desire this at a later time. Patient shares that she has been  performing [employer] positions at home and drinking raspberry leaf tea. Patient was encouraged to continue these methods with the addition of evening primrose oil: one capsule orally and one capsule inserted vaginally. Today we discussed her birth plan, which has printed; a copy will be scanned into her chart. We also discussed indications for induction once patient reaches 41 weeks; she expresses understanding.    Term labor symptoms and general obstetric precautions including but not limited to vaginal bleeding, contractions, leaking of fluid and fetal movement were reviewed in detail with the patient. Please refer to After Visit Summary for other counseling recommendations.   Return in about 1 week (around 11/16/2019).  Future Appointments  Date Time Provider [place]  11/16/2019  8:55 AM [place], Kathie Dike, [place] None    Mercy Moore, Medical Student

## 2019-11-09 NOTE — Progress Notes (Signed)
ROB.  C/o contractions 1 hr apart.

## 2019-11-12 ENCOUNTER — Inpatient Hospital Stay (HOSPITAL_COMMUNITY)
Admission: AD | Admit: 2019-11-12 | Discharge: 2019-11-13 | Disposition: A | Discharge status: Home / Self Care | Payer: Medicaid Other | Source: Home / Self Care | Attending: Obstetrics and Gynecology | Admitting: Obstetrics and Gynecology

## 2019-11-12 ENCOUNTER — Other Ambulatory Visit: Payer: Self-pay

## 2019-11-12 DIAGNOSIS — O479 False labor, unspecified: Secondary | ICD-10-CM

## 2019-11-12 DIAGNOSIS — Z3480 Encounter for supervision of other normal pregnancy, unspecified trimester: Secondary | ICD-10-CM

## 2019-11-12 DIAGNOSIS — O471 False labor at or after 37 completed weeks of gestation: Secondary | ICD-10-CM | POA: Insufficient documentation

## 2019-11-13 ENCOUNTER — Encounter (HOSPITAL_COMMUNITY): Payer: Self-pay

## 2019-11-13 ENCOUNTER — Telehealth (HOSPITAL_COMMUNITY): Payer: Self-pay | Admitting: *Deleted

## 2019-11-13 NOTE — Telephone Encounter (Signed)
Preadmission screen  

## 2019-11-13 NOTE — MAU Note (Signed)
Ctxs all day. Now closer and stronger. Denies LOF or bleeding

## 2019-11-13 NOTE — Discharge Instructions (Signed)
Braxton Hicks Contractions Contractions of the uterus can occur throughout pregnancy, but they are not always a sign that you are in labor. You may have practice contractions called Braxton Hicks contractions. These false labor contractions are sometimes confused with true labor. What are [place] contractions? Braxton Hicks contractions are tightening movements that occur in the muscles of the uterus before labor. Unlike true labor contractions, these contractions do not result in opening (dilation) and thinning of the cervix. Toward the end of pregnancy (32-34 weeks), Braxton Hicks contractions can happen more often and may become stronger. These contractions are sometimes difficult to tell apart from true labor because they can be very uncomfortable. You should not feel embarrassed if you go to the hospital with false labor. Sometimes, the only way to tell if you are in true labor is for your health care provider to look for changes in the cervix. The health care provider will do a physical exam and may monitor your contractions. If you are not in true labor, the exam should show that your cervix is not dilating and your water has not broken. If there are no other health problems associated with your pregnancy, it is completely safe for you to be sent home with false labor. You may continue to have Braxton Hicks contractions until you go into true labor. How to tell the difference between true labor and false labor True labor  Contractions last 30-70 seconds.  Contractions become very regular.  Discomfort is usually felt in the top of the uterus, and it spreads to the lower abdomen and low back.  Contractions do not go away with walking.  Contractions usually become more intense and increase in frequency.  The cervix dilates and gets thinner. False labor  Contractions are usually shorter and not as strong as true labor contractions.  Contractions are usually  irregular.  Contractions are often felt in the front of the lower abdomen and in the groin.  Contractions may go away when you walk around or change positions while lying down.  Contractions get weaker and are shorter-lasting as time goes on.  The cervix usually does not dilate or become thin. Follow these instructions at home:   Take over-the-counter and prescription medicines only as told by your health care provider.  Keep up with your usual exercises and follow other instructions from your health care provider.  Eat and drink lightly if you think you are going into labor.  If Braxton Hicks contractions are making you uncomfortable: ? Change your position from lying down or resting to walking, or change from walking to resting. ? Sit and rest in a tub of warm water. ? Drink enough fluid to keep your urine pale yellow. Dehydration may cause these contractions. ? Do slow and deep breathing several times an hour.  Keep all follow-up prenatal visits as told by your health care provider. This is important. Contact a health care provider if:  You have a fever.  You have continuous pain in your abdomen. Get help right away if:  Your contractions become stronger, more regular, and closer together.  You have fluid leaking or gushing from your vagina.  You pass blood-tinged mucus (bloody show).  You have bleeding from your vagina.  You have low back pain that you never had before.  You feel your babys head pushing down and causing pelvic pressure.  Your baby is not moving inside you as much as it used to. Summary  Contractions that occur before labor  are called Braxton Hicks contractions, false labor, or practice contractions.  Braxton Hicks contractions are usually shorter, weaker, farther apart, and less regular than true labor contractions. True labor contractions usually become progressively stronger and regular, and they become more frequent.  Manage discomfort from  [facility] contractions by changing position, resting in a warm bath, drinking plenty of water, or practicing deep breathing. This information is not intended to replace advice given to you by your health care provider. Make sure you discuss any questions you have with your health care provider. Document Released: 05/02/2017 Document Revised: 11/29/2017 Document Reviewed: 05/02/2017 Elsevier Patient Education  2020 [place].  Fetal Movement Counts Patient Name: ________________________________________________ Patient Due Date: ____________________ What is a fetal movement count?  A fetal movement count is the number of times that you feel your baby move during a certain amount of time. This may also be called a fetal kick count. A fetal movement count is recommended for every pregnant woman. You may be asked to start counting fetal movements as early as week 28 of your pregnancy. Pay attention to when your baby is most active. You may notice your baby's sleep and wake cycles. You may also notice things that make your baby move more. You should do a fetal movement count: When your baby is normally most active. At the same time each day. A good time to count movements is while you are resting, after having something to eat and drink. How do I count fetal movements? Find a quiet, comfortable area. Sit, or lie down on your side. Write down the date, the start time and stop time, and the number of movements that you felt between those two times. Take this information with you to your health care visits. For 2 hours, count kicks, flutters, swishes, rolls, and jabs. You should feel at least 10 movements during 2 hours. You may stop counting after you have felt 10 movements. If you do not feel 10 movements in 2 hours, have something to eat and drink. Then, keep resting and counting for 1 hour. If you feel at least 4 movements during that hour, you may stop counting. Contact a health care provider  if: You feel fewer than 4 movements in 2 hours. Your baby is not moving like he or she usually does. Date: ____________ Start time: ____________ Stop time: ____________ Movements: ____________ Date: ____________ Start time: ____________ Stop time: ____________ Movements: ____________ Date: ____________ Start time: ____________ Stop time: ____________ Movements: ____________ Date: ____________ Start time: ____________ Stop time: ____________ Movements: ____________ Date: ____________ Start time: ____________ Stop time: ____________ Movements: ____________ Date: ____________ Start time: ____________ Stop time: ____________ Movements: ____________ Date: ____________ Start time: ____________ Stop time: ____________ Movements: ____________ Date: ____________ Start time: ____________ Stop time: ____________ Movements: ____________ Date: ____________ Start time: ____________ Stop time: ____________ Movements: ____________ This information is not intended to replace advice given to you by your health care provider. Make sure you discuss any questions you have with your health care provider. Document Released: 01/16/2007 Document Revised: 01/06/2019 Document Reviewed: 01/26/2016 Elsevier Patient Education  2020 [place]. Vaginal Delivery  Vaginal delivery means that you give birth by pushing your baby out of your birth canal (vagina). A team of health care providers will help you before, during, and after vaginal delivery. Birth experiences are unique for every woman and every pregnancy, and birth experiences vary depending on where you choose to give birth. What happens when I arrive at the birth center or hospital?  Once you are in labor and have been admitted into the hospital or birth center, your health care provider may:  Review your pregnancy history and any concerns that you have.  Insert an IV into one of your veins. This may be used to give you fluids and medicines.  Check your blood  pressure, pulse, temperature, and heart rate (vital signs).  Check whether your bag of water (amniotic sac) has broken (ruptured).  Talk with you about your birth plan and discuss pain control options. Monitoring Your health care provider may monitor your contractions (uterine monitoring) and your baby's heart rate (fetal monitoring). You may need to be monitored:  Often, but not continuously (intermittently).  All the time or for long periods at a time (continuously). Continuous monitoring may be needed if: ? You are taking certain medicines, such as medicine to relieve pain or make your contractions stronger. ? You have pregnancy or labor complications. Monitoring may be done by:  Placing a special stethoscope or a handheld monitoring device on your abdomen to check your baby's heartbeat and to check for contractions.  Placing monitors on your abdomen (external monitors) to record your baby's heartbeat and the frequency and length of contractions.  Placing monitors inside your uterus through your vagina (internal monitors) to record your baby's heartbeat and the frequency, length, and strength of your contractions. Depending on the type of monitor, it may remain in your uterus or on your baby's head until birth.  Telemetry. This is a type of continuous monitoring that can be done with external or internal monitors. Instead of having to stay in bed, you are able to move around during telemetry. Physical exam Your health care provider may perform frequent physical exams. This may include:  Checking how and where your baby is positioned in your uterus.  Checking your cervix to determine: ? Whether it is thinning out (effacing). ? Whether it is opening up (dilating). What happens during labor and delivery?  Normal labor and delivery is divided into the following three stages: Stage 1  This is the longest stage of labor.  This stage can last for hours or days.  Throughout this  stage, you will feel contractions. Contractions generally feel mild, infrequent, and irregular at first. They get stronger, more frequent (about every 2-3 minutes), and more regular as you move through this stage.  This stage ends when your cervix is completely dilated to 4 inches (10 cm) and completely effaced. Stage 2  This stage starts once your cervix is completely effaced and dilated and lasts until the delivery of your baby.  This stage may last from 20 minutes to 2 hours.  This is the stage where you will feel an urge to push your baby out of your vagina.  You may feel stretching and burning pain, especially when the widest part of your baby's head passes through the vaginal opening (crowning).  Once your baby is delivered, the umbilical cord will be clamped and cut. This usually occurs after waiting a period of 1-2 minutes after delivery.  Your baby will be placed on your bare chest (skin-to-skin contact) in an upright position and covered with a warm blanket. Watch your baby for feeding cues, like rooting or sucking, and help the baby to your breast for his or her first feeding. Stage 3  This stage starts immediately after the birth of your baby and ends after you deliver the placenta.  This stage may take anywhere from 5 to 30 minutes.  After your baby has been delivered, you will feel contractions as your body expels the placenta and your uterus contracts to control bleeding. What can I expect after labor and delivery?  After labor is over, you and your baby will be monitored closely until you are ready to go home to ensure that you are both healthy. Your health care team will teach you how to care for yourself and your baby.  You and your baby will stay in the same room (rooming in) during your hospital stay. This will encourage early bonding and successful breastfeeding.  You may continue to receive fluids and medicines through an IV.  Your uterus will be checked and  massaged regularly (fundal massage).  You will have some soreness and pain in your abdomen, vagina, and the area of skin between your vaginal opening and your anus (perineum).  If an incision was made near your vagina (episiotomy) or if you had some vaginal tearing during delivery, cold compresses may be placed on your episiotomy or your tear. This helps to reduce pain and swelling.  You may be given a squirt bottle to use instead of wiping when you go to the bathroom. To use the squirt bottle, follow these steps: ? Before you urinate, fill the squirt bottle with warm water. Do not use hot water. ? After you urinate, while you are sitting on the toilet, use the squirt bottle to rinse the area around your urethra and vaginal opening. This rinses away any urine and blood. ? Fill the squirt bottle with clean water every time you use the bathroom.  It is normal to have vaginal bleeding after delivery. Wear a sanitary pad for vaginal bleeding and discharge. Summary  Vaginal delivery means that you will give birth by pushing your baby out of your birth canal (vagina).  Your health care provider may monitor your contractions (uterine monitoring) and your baby's heart rate (fetal monitoring).  Your health care provider may perform a physical exam.  Normal labor and delivery is divided into three stages.  After labor is over, you and your baby will be monitored closely until you are ready to go home. This information is not intended to replace advice given to you by your health care provider. Make sure you discuss any questions you have with your health care provider. Document Released: 09/25/2008 Document Revised: 01/21/2018 Document Reviewed: 01/21/2018 Elsevier Patient Education  2020 [employer].

## 2019-11-13 NOTE — MAU Note (Signed)
I have communicated with Dr. Jones Bales and reviewed vital signs:  Vitals:   11/13/19 0021 11/13/19 0110  BP: 136/80 137/82  Pulse: 89 82  Resp:    Temp:      Vaginal exam:  Dilation: 1 Effacement (%): 50 Cervical Position: Posterior Station: -2 Presentation: Vertex Exam by:: Maryagnes Amos, RN,   Also reviewed contraction pattern and that non-stress test is reactive.  It has been documented that patient is contracting every 6-7 minutes with no cervical from previous exam on Monday not indicating active labor.  Patient denies any other complaints.  Patient is requesting to be discharged home given she wants a low intervention birth. Based on this report provider has given order for discharge.  A discharge order and diagnosis entered by a provider.   Labor discharge instructions reviewed with patient.

## 2019-11-14 ENCOUNTER — Other Ambulatory Visit: Payer: Self-pay

## 2019-11-14 ENCOUNTER — Inpatient Hospital Stay (HOSPITAL_COMMUNITY)
Admission: AD | Admit: 2019-11-14 | Discharge: 2019-11-17 | DRG: 807 | Disposition: A | Discharge status: Home / Self Care | Payer: Medicaid Other | Attending: Obstetrics & Gynecology | Admitting: Obstetrics & Gynecology

## 2019-11-14 ENCOUNTER — Encounter (HOSPITAL_COMMUNITY): Payer: Self-pay

## 2019-11-14 DIAGNOSIS — O134 Gestational [pregnancy-induced] hypertension without significant proteinuria, complicating childbirth: Secondary | ICD-10-CM | POA: Diagnosis present

## 2019-11-14 DIAGNOSIS — Z362 Encounter for other antenatal screening follow-up: Secondary | ICD-10-CM

## 2019-11-14 DIAGNOSIS — Z3A40 40 weeks gestation of pregnancy: Secondary | ICD-10-CM

## 2019-11-14 DIAGNOSIS — D573 Sickle-cell trait: Secondary | ICD-10-CM | POA: Diagnosis present

## 2019-11-14 DIAGNOSIS — Z20828 Contact with and (suspected) exposure to other viral communicable diseases: Secondary | ICD-10-CM | POA: Diagnosis present

## 2019-11-14 DIAGNOSIS — O480 Post-term pregnancy: Secondary | ICD-10-CM | POA: Diagnosis present

## 2019-11-14 DIAGNOSIS — Z3A41 41 weeks gestation of pregnancy: Secondary | ICD-10-CM | POA: Diagnosis present

## 2019-11-14 DIAGNOSIS — Z87891 Personal history of nicotine dependence: Secondary | ICD-10-CM

## 2019-11-14 DIAGNOSIS — O99214 Obesity complicating childbirth: Secondary | ICD-10-CM | POA: Diagnosis present

## 2019-11-14 DIAGNOSIS — O9902 Anemia complicating childbirth: Secondary | ICD-10-CM | POA: Diagnosis present

## 2019-11-14 DIAGNOSIS — O139 Gestational [pregnancy-induced] hypertension without significant proteinuria, unspecified trimester: Secondary | ICD-10-CM | POA: Diagnosis present

## 2019-11-14 DIAGNOSIS — Z3480 Encounter for supervision of other normal pregnancy, unspecified trimester: Secondary | ICD-10-CM

## 2019-11-14 LAB — CBC
HCT: 34 % — ABNORMAL LOW (ref 36.0–46.0)
Hemoglobin: 11.2 g/dL — ABNORMAL LOW (ref 12.0–15.0)
MCH: 26 pg (ref 26.0–34.0)
MCHC: 32.9 g/dL (ref 30.0–36.0)
MCV: 79.1 fL — ABNORMAL LOW (ref 80.0–100.0)
Platelets: 266 10*3/uL (ref 150–400)
RBC: 4.3 MIL/uL (ref 3.87–5.11)
RDW: 16.3 % — ABNORMAL HIGH (ref 11.5–15.5)
WBC: 12.1 10*3/uL — ABNORMAL HIGH (ref 4.0–10.5)
nRBC: 0 % (ref 0.0–0.2)

## 2019-11-14 LAB — COMPREHENSIVE METABOLIC PANEL
ALT: 15 U/L (ref 0–44)
AST: 21 U/L (ref 15–41)
Albumin: 2.9 g/dL — ABNORMAL LOW (ref 3.5–5.0)
Alkaline Phosphatase: 127 U/L — ABNORMAL HIGH (ref 38–126)
Anion gap: 11 (ref 5–15)
BUN: <5 mg/dL — ABNORMAL LOW (ref 6–20)
CO2: 20 mmol/L — ABNORMAL LOW (ref 22–32)
Calcium: 9.3 mg/dL (ref 8.9–10.3)
Chloride: 106 mmol/L (ref 98–111)
Creatinine, Ser: 0.81 mg/dL (ref 0.44–1.00)
GFR calc Af Amer: >60 mL/min (ref >60)
GFR calc non Af Amer: >60 mL/min (ref >60)
Glucose, Bld: 98 mg/dL (ref 70–99)
Potassium: 3.8 mmol/L (ref 3.5–5.1)
Sodium: 137 mmol/L (ref 135–145)
Total Bilirubin: 0.5 mg/dL (ref 0.3–1.2)
Total Protein: 6.2 g/dL — ABNORMAL LOW (ref 6.5–8.1)

## 2019-11-14 LAB — TYPE AND SCREEN
ABO/RH(D): O POS
Antibody Screen: NEGATIVE

## 2019-11-14 MED ORDER — SOD CITRATE-CITRIC ACID 500-334 MG/5ML PO SOLN: 30.0000 mL | ORAL | Status: DC | PRN

## 2019-11-14 MED ORDER — MISOPROSTOL 50MCG HALF TABLET
50.0000 ug | ORAL_TABLET | ORAL | Status: DC
  Administered 2019-11-14 – 2019-11-15 (×4): 50 ug via ORAL
  Filled 2019-11-14 (×5): qty 1

## 2019-11-14 MED ORDER — OXYTOCIN BOLUS FROM INFUSION
500.0000 mL | Freq: Once | INTRAVENOUS | Status: AC
  Administered 2019-11-16: 500 mL via INTRAVENOUS

## 2019-11-14 MED ORDER — LIDOCAINE HCL (PF) 1 % IJ SOLN: 30.0000 mL | INTRAMUSCULAR | Status: DC | PRN

## 2019-11-14 MED ORDER — OXYTOCIN 40 UNITS IN NORMAL SALINE INFUSION - SIMPLE MED
2.5000 [IU]/h | INTRAVENOUS | Status: DC
  Filled 2019-11-14: qty 1000

## 2019-11-14 MED ORDER — OXYCODONE-ACETAMINOPHEN 5-325 MG PO TABS: 1.0000 | ORAL_TABLET | ORAL | Status: DC | PRN

## 2019-11-14 MED ORDER — LACTATED RINGERS IV SOLN
INTRAVENOUS | Status: DC
  Administered 2019-11-15 – 2019-11-16 (×4): via INTRAVENOUS

## 2019-11-14 MED ORDER — TERBUTALINE SULFATE 1 MG/ML IJ SOLN: 0.2500 mg | Freq: Once | INTRAMUSCULAR | Status: DC | PRN

## 2019-11-14 MED ORDER — LACTATED RINGERS IV SOLN
500.0000 mL | INTRAVENOUS | Status: DC | PRN
  Administered 2019-11-14: 1000 mL via INTRAVENOUS
  Administered 2019-11-15: 500 mL via INTRAVENOUS

## 2019-11-14 MED ORDER — ACETAMINOPHEN 325 MG PO TABS: 650.0000 mg | ORAL_TABLET | ORAL | Status: DC | PRN

## 2019-11-14 MED ORDER — OXYCODONE-ACETAMINOPHEN 5-325 MG PO TABS: 2.0000 | ORAL_TABLET | ORAL | Status: DC | PRN

## 2019-11-14 MED ORDER — ONDANSETRON HCL 4 MG/2ML IJ SOLN
4.0000 mg | Freq: Four times a day (QID) | INTRAMUSCULAR | Status: DC | PRN
  Administered 2019-11-15: 20:00:00 4 mg via INTRAVENOUS
  Filled 2019-11-14: qty 2

## 2019-11-14 NOTE — MAU Note (Signed)
Pt reports contractions every minute for the last several hours. Denies LOF or vaginal bleeding. Reports decreased fetal movement today. Cervix 1cm on last exam.

## 2019-11-14 NOTE — H&P (Signed)
LABOR AND DELIVERY ADMISSION HISTORY AND PHYSICAL NOTE  Andrea Archer is a 18 y.o. female G1P0000 with IUP at [redacted]w[redacted]d by LMP presenting for labor check in MAU. Found to have elevated BPs meeting criteria. Will induce for new onset gHTN at term. Denies headaches, vision changes and RUQ pain.  She reports positive fetal movement. She denies leakage of fluid or vaginal bleeding.  Prenatal History/Complications: PNC at [facility]  Pregnancy complications:  - sickle cell trait  - new onset gHTN  Past Medical History: Past Medical History:  Diagnosis Date  . Anxiety   . Depression   . Pregnant   . Sickle cell trait [facility])     Past Surgical History: Past Surgical History:  Procedure Laterality Date  . FINGER SURGERY Left    Pinky Finger  . NO PAST SURGERIES      Obstetrical History: OB History    Gravida  1   Para  0   Term  0   Preterm  0   AB  0   Living  0     SAB  0   TAB  0   Ectopic  0   Multiple  0   Live Births  0           Social History: Social History   Socioeconomic History  . Marital status: Single    Spouse name: Not on file  . Number of children: Not on file  . Years of education: Not on file  . Highest education level: Not on file  Occupational History  . Not on file  Social Needs  . Financial resource strain: Not on file  . Food insecurity    Worry: Not on file    Inability: Not on file  . Transportation needs    Medical: Not on file    Non-medical: Not on file  Tobacco Use  . Smoking status: Former Smoker    Types: Cigars  . Smokeless tobacco: Never Used  Substance and Sexual Activity  . Alcohol use: No  . Drug use: No  . Sexual activity: Yes    Partners: Male    Birth control/protection: Other-see comments    Comment: pregnant  Lifestyle  . Physical activity    Days per week: Not on file    Minutes per session: Not on file  . Stress: Not on file  Relationships  . Social Herbalist on phone: Not on file   Gets together: Not on file    Attends religious service: Not on file    Active member of club or organization: Not on file    Attends meetings of clubs or organizations: Not on file    Relationship status: Not on file  Other Topics Concern  . Not on file  Social History Narrative  . Not on file    Family History: Family History  Problem Relation Age of Onset  . Diabetes Maternal Grandmother   . Hypertension Maternal Grandmother   . Heart disease Maternal Grandmother   . Fibromyalgia Maternal Grandmother   . Pancreatic cancer Maternal Grandfather   . Hypertension Mother   . Sickle cell anemia Father   . Colon cancer Neg Hx   . Stomach cancer Neg Hx     Allergies: No Known Allergies  Medications Prior to Admission  Medication Sig Dispense Refill Last Dose  . Doxylamine-Pyridoxine (DICLEGIS) 10-10 MG TBEC Take 2 tabs at bedtime. If needed, add another tab in the morning. If needed, add  another tab in the afternoon, up to 4 tabs/day. (Patient not taking: Reported on 09/23/2019) 100 tablet 5   . Misc. Devices MISC Dispense one maternity belt for patient 1 each 0   . Prenatal MV-Min-Fe Fum-FA-DHA (PRENATAL 1 PO) Take by mouth.        Review of Systems  All systems reviewed and negative except as stated in HPI  Physical Exam Blood pressure (!) 134/97, pulse 95, temperature 98.2 F (36.8 C), temperature source Axillary, resp. rate 16, last menstrual period 02/06/2019, SpO2 100 %. General appearance: alert, oriented, NAD Lungs: normal respiratory effort Heart: regular rate Abdomen: soft, non-tender; gravid, FH appropriate for GA Extremities: No calf swelling or tenderness Presentation: cephalic Fetal monitoring: 150 bpm, moderate variability, +acels, no decels  Uterine activity: Irregular  Dilation: Fingertip Effacement (%): Thick Station: Ballotable Exam by:: Juleen China MD  Prenatal labs: ABO, Rh: O positive  Antibody: Negative  Rubella: 1.22 (04/20 0931) RPR: Non  Reactive (08/10 0919)  HBsAg: Negative (04/20 0931)  HIV: Non Reactive (08/10 0919)  GC/Chlamydia: Negative  GBS: --[place]end[place] Cloud (10/19 1005)  2-hr GTT: Negative  Genetic screening:  Low risk  Anatomy US: Normal   Prenatal Transfer Tool  Maternal Diabetes: No Genetic Screening: Normal Maternal Ultrasounds/Referrals: Normal Fetal Ultrasounds or other Referrals:  None Maternal Substance Abuse:  No Significant Maternal Medications:  None Significant Maternal Lab Results: Group B Strep negative  Results for orders placed or performed during the hospital encounter of 11/14/19 (from the past 24 hour(s))  CBC   Collection Time: 11/14/19  8:46 PM  Result Value Ref Range   WBC 12.1 (H) 4.0 - 10.5 K/uL   RBC 4.30 3.87 - 5.11 MIL/uL   Hemoglobin 11.2 (L) 12.0 - 15.0 g/dL   HCT 34.0 (L) 36.0 - 46.0 %   MCV 79.1 (L) 80.0 - 100.0 fL   MCH 26.0 26.0 - 34.0 pg   MCHC 32.9 30.0 - 36.0 g/dL   RDW 16.3 (H) 11.5 - 15.5 %   Platelets 266 150 - 400 K/uL   nRBC 0.0 0.0 - 0.2 %  Type and screen   Collection Time: 11/14/19  8:46 PM  Result Value Ref Range   ABO/RH(D) PENDING    Antibody Screen PENDING    Sample Expiration      11/17/2019,2359 Performed at [facility], [address] [place], [place] [address]     Patient Active Problem List   Diagnosis Date Noted  . Post term pregnancy at [redacted] weeks gestation 11/14/2019  . Gestational hypertension 11/14/2019  . Sickle cell trait ([place]) 05/04/2019  . Supervision of other normal pregnancy, antepartum 04/20/2019  . Change in bowel habits 06/18/2018  . Left lower quadrant pain 06/17/2018    Assessment: Andrea Archer is a 18 y.o. G1P0000 at [redacted]w[redacted]d here for IOL for new onset gHTN at term. Patient asymptomatic. No severe range pressures. Will order [place] labs.   #Labor: Induction. Start with cytotec 50 mg buccal. Cervix very posterior and patient not tolerating cervical exams well so will hold off on FB placement for now.  #Pain: Does not plan  for epidural  #FWB: Cat I  #ID:  GBS neg  #MOF: Breast  #MOC:Declines  #Circ:  N/A   Melina Schools 11/14/2019, 9:37 PM

## 2019-11-14 NOTE — MAU Provider Note (Signed)
Pt informed that the ultrasound is considered a limited OB ultrasound and is not intended to be a complete ultrasound exam.  Patient also informed that the ultrasound is not being completed with the intent of assessing for fetal or placental anomalies or any pelvic abnormalities.  Explained that the purpose of today's ultrasound is to assess for  presentation.  Patient acknowledges the purpose of the exam and the limitations of the study.    Vertex position.  Noni Saupe I, NP 11/14/2019 9:02 PM

## 2019-11-15 ENCOUNTER — Inpatient Hospital Stay (HOSPITAL_COMMUNITY): Payer: Medicaid Other | Admitting: Anesthesiology

## 2019-11-15 LAB — SARS CORONAVIRUS 2 (TAT 6-24 HRS): SARS Coronavirus 2: NEGATIVE

## 2019-11-15 LAB — CBC
HCT: 33.8 % — ABNORMAL LOW (ref 36.0–46.0)
Hemoglobin: 11.2 g/dL — ABNORMAL LOW (ref 12.0–15.0)
MCH: 25.8 pg — ABNORMAL LOW (ref 26.0–34.0)
MCHC: 33.1 g/dL (ref 30.0–36.0)
MCV: 77.9 fL — ABNORMAL LOW (ref 80.0–100.0)
Platelets: 257 10*3/uL (ref 150–400)
RBC: 4.34 MIL/uL (ref 3.87–5.11)
RDW: 16.1 % — ABNORMAL HIGH (ref 11.5–15.5)
WBC: 17.5 10*3/uL — ABNORMAL HIGH (ref 4.0–10.5)
nRBC: 0 % (ref 0.0–0.2)

## 2019-11-15 LAB — PROTEIN / CREATININE RATIO, URINE
Creatinine, Urine: 73 mg/dL
Protein Creatinine Ratio: 0.19 mg/mg{Cre} — ABNORMAL HIGH (ref 0.00–0.15)
Total Protein, Urine: 14 mg/dL

## 2019-11-15 LAB — RPR: RPR Ser Ql: NONREACTIVE

## 2019-11-15 MED ORDER — DIPHENHYDRAMINE HCL 50 MG/ML IJ SOLN: 12.5000 mg | INTRAMUSCULAR | Status: DC | PRN

## 2019-11-15 MED ORDER — LIDOCAINE HCL (PF) 1 % IJ SOLN
INTRAMUSCULAR | Status: DC | PRN
  Administered 2019-11-15: 6 mL via EPIDURAL

## 2019-11-15 MED ORDER — LACTATED RINGERS IV SOLN
500.0000 mL | Freq: Once | INTRAVENOUS | Status: AC
  Administered 2019-11-15: 500 mL via INTRAVENOUS

## 2019-11-15 MED ORDER — FENTANYL CITRATE (PF) 100 MCG/2ML IJ SOLN
100.0000 ug | INTRAMUSCULAR | Status: DC | PRN
  Administered 2019-11-15: 20:00:00 100 ug via INTRAVENOUS
  Filled 2019-11-15: qty 2

## 2019-11-15 MED ORDER — TERBUTALINE SULFATE 1 MG/ML IJ SOLN: 0.2500 mg | Freq: Once | INTRAMUSCULAR | Status: DC | PRN

## 2019-11-15 MED ORDER — EPHEDRINE 5 MG/ML INJ: 10.0000 mg | INTRAVENOUS | Status: DC | PRN

## 2019-11-15 MED ORDER — PHENYLEPHRINE 40 MCG/ML (10ML) SYRINGE FOR IV PUSH (FOR BLOOD PRESSURE SUPPORT): 80.0000 ug | PREFILLED_SYRINGE | INTRAVENOUS | Status: DC | PRN

## 2019-11-15 MED ORDER — FENTANYL-BUPIVACAINE-NACL 0.5-0.125-0.9 MG/250ML-% EP SOLN: 12.0000 mL/h | EPIDURAL | Status: DC | PRN

## 2019-11-15 MED ORDER — OXYTOCIN 40 UNITS IN NORMAL SALINE INFUSION - SIMPLE MED
1.0000 m[IU]/min | INTRAVENOUS | Status: DC
  Administered 2019-11-15: 02:00:00 2 m[IU]/min via INTRAVENOUS

## 2019-11-15 MED ORDER — SODIUM CHLORIDE (PF) 0.9 % IJ SOLN
INTRAMUSCULAR | Status: DC | PRN
  Administered 2019-11-15: 12 mL/h via EPIDURAL

## 2019-11-15 MED ORDER — FENTANYL-BUPIVACAINE-NACL 0.5-0.125-0.9 MG/250ML-% EP SOLN
12.0000 mL/h | EPIDURAL | Status: DC | PRN
  Filled 2019-11-15: qty 250

## 2019-11-15 MED ORDER — FENTANYL CITRATE (PF) 100 MCG/2ML IJ SOLN
100.0000 ug | Freq: Once | INTRAMUSCULAR | Status: AC
  Administered 2019-11-15: 100 ug via INTRAVENOUS
  Filled 2019-11-15: qty 2

## 2019-11-15 MED ORDER — OXYTOCIN 40 UNITS IN NORMAL SALINE INFUSION - SIMPLE MED
1.0000 m[IU]/min | INTRAVENOUS | Status: DC
  Administered 2019-11-15: 2 m[IU]/min via INTRAVENOUS

## 2019-11-15 NOTE — Progress Notes (Signed)
Andrea Archer is a 18 y.o. G1P0000 at [redacted]w[redacted]d by LMP admitted for induction of labor due to [facility].  Subjective: Pt doing well with IV pain medication and s/o as labor support.   Objective: BP 135/84   Pulse 85   Temp 98.6 F (37 C) (Oral)   Resp (!) 22   Ht 5\' 7"  (1.702 m)   Wt 90.3 kg   LMP 02/06/2019   SpO2 100%   BMI 31.18 kg/m  No intake/output data recorded. No intake/output data recorded.  FHT:  FHR: 150 bpm, variability: moderate,  accelerations:  Present,  decelerations:  Present intermittent early decels UC:   regular, every 4 minutes SVE:   Dilation: 7 Effacement (%): 70 Station: -1 Exam by:: Lattie Haw, CNM  Labs: Lab Results  Component Value Date   WBC 12.1 (H) 11/14/2019   HGB 11.2 (L) 11/14/2019   HCT 34.0 (L) 11/14/2019   MCV 79.1 (L) 11/14/2019   PLT 266 11/14/2019    Assessment / Plan: Protracted active phase  Labor: discussed options with pt for slow progress including expectant management and Pitocin for augmentation.  Pt chooses Pitocin to increase strength and frequency of contractions at this time. Preeclampsia:  labs stable Fetal Wellbeing:  Category I Pain Control:  IV pain meds I/D:  GBS neg Anticipated MOD:  NSVD  Andrea Archer 11/15/2019, 7:42 PM

## 2019-11-15 NOTE — Progress Notes (Signed)
[name], [id number]  Subjective -Care assumed of 18 y.o. G1P0 at [redacted]w[redacted]d who presents for IOL s/t new onset gHTN during labor evaluation. Pregnancy and medical history significant for [place] Trait.  In room to meet acquaintance of patient who reports pain 10/10, but endorses that she is coping well.  Patient reports desire for epidural.  Patient denies HA, visual disturbances, or chest pain.    Objective Vitals:   11/15/19 2005 11/15/19 2035  BP: 135/78 (!) 138/95  Pulse: 82 70  Resp: 18 18  Temp:    SpO2:     No intake/output data recorded. No intake/output data recorded.  Physical Exam Constitutional:      General: She is in acute distress (with contractions).     Appearance: Normal appearance.  HENT:     Head: Normocephalic and atraumatic.     Mouth/Throat:     Mouth: Mucous membranes are dry.  Eyes:     Conjunctiva/sclera: Conjunctivae normal.  Neck:     Musculoskeletal: Normal range of motion.  Cardiovascular:     Rate and Rhythm: Normal rate and regular rhythm.     Pulses: Normal pulses.     Heart sounds: Normal heart sounds.  Pulmonary:     Effort: Pulmonary effort is normal.     Breath sounds: Normal breath sounds.  Abdominal:     General: Bowel sounds are normal.  Musculoskeletal: Normal range of motion.  Skin:    General: Skin is warm and dry.  Neurological:     Mental Status: She is alert and oriented to person, place, and time.  Psychiatric:        Mood and Affect: Mood normal.        Behavior: Behavior normal.        Thought Content: Thought content normal.     [place]:[id number]: 7 Effacement (%): 70 Cervical Position: Middle Station: -1 Presentation: Vertex Exam by:: C Sullivan RN  Membranes: AROM x 4 hrs  Pelvis: -Proven:No -Adequate: Yes [place]: -Position:Vtx  -EFW:6 1/2-7 lbs   FHR: 145 bpm, Mod Var, + Early Decels, -Accels UC:  Q3-66min, palpates moderate  Management Plan: Induction Phase: Pitocin  Assessment IUP at 40.2wks Cat I  FT GBS Negative gHTN  Plan -Reviewed POC in regards to  patient expectations and current labor phase. -Patient informed that provider supports decision for epidural placemtn for pain management. -Will continue to monitor and titrate pitocin as appropriate. -CBC to be drawn for epidural placement.  -Continue other mgmt as ordered  Maryann Conners, CNM 11/15/2019, 9:11 PM

## 2019-11-15 NOTE — Progress Notes (Signed)
Labor Progress Note Andrea Archer is a 18 y.o. G1P0000 at [redacted]w[redacted]d presented for IOL for new onset gHTN  S:  Patient comfortable, feeling better after shower  O:  BP 133/84   Pulse 82   Temp 98.2 F (36.8 C)   Resp 20   Ht 5\' 7"  (1.702 m)   Wt 90.3 kg   LMP 02/06/2019   SpO2 100%   BMI 31.18 kg/m   Fetal Tracing:  Baseline: 150 Variability: moderate Accels: 15x15 Decels: none  Toco: 1-3   CVE: Dilation: 1 Effacement (%): 60 Cervical Position: Posterior Station: 0 Presentation: Vertex Exam by:: Chrys Racer, CNM   A&P: 18 y.o. G1P0000 [redacted]w[redacted]d IOL gHTN #Labor: Unchanged cervix. Discussed with patient placement of foley balloon for cervical ripening. Risks and benefits reviewed. Patient agreeable to plan of care. Foley balloon inserted without difficulty and inflated with 60cc sterile water. Patient tolerated procedure well.  Will continue cytotec #Pain: per patient request #FWB: Cat 1 #GBS negative  Wende Mott, CNM 2:14 PM

## 2019-11-15 NOTE — Progress Notes (Signed)
LABOR PROGRESS NOTE  Kahniya Overbay is a 18 y.o. G1P0000 at [redacted]w[redacted]d  admitted for IOL for new onset gHTN.   Subjective: Strip note. Discussed plan of care with RN.   Objective: BP 136/74   Pulse 77   Temp 98 F (36.7 C) (Oral)   Resp 18   Ht 5\' 7"  (1.702 m)   Wt 90.3 kg   LMP 02/06/2019   SpO2 100%   BMI 31.18 kg/m  or  Vitals:   11/15/19 0306 11/15/19 0334 11/15/19 0412 11/15/19 0442  BP: 131/81 127/66 (!) 144/80 136/74  Pulse: 87 79 66 77  Resp: 18 18 16 18   Temp:  98 F (36.7 C)    TempSrc:  Oral    SpO2:      Weight:      Height:        Dilation: 6.5 Effacement (%): 90 Cervical Position: Middle Station: Plus 1 Presentation: Vertex Exam by:: Lenise Herald RN FHT: baseline rate 150, moderate varibility, +acel, variable decel Toco: q1-3 min   Labs: Lab Results  Component Value Date   WBC 12.1 (H) 11/14/2019   HGB 11.2 (L) 11/14/2019   HCT 34.0 (L) 11/14/2019   MCV 79.1 (L) 11/14/2019   PLT 266 11/14/2019    Patient Active Problem List   Diagnosis Date Noted  . Post term pregnancy at [redacted] weeks gestation 11/14/2019  . Gestational hypertension 11/14/2019  . Sickle cell trait ([place]) 05/04/2019  . Supervision of other normal pregnancy, antepartum 04/20/2019  . Change in bowel habits 06/18/2018  . Left lower quadrant pain 06/17/2018    Assessment / Plan: 18 y.o. G1P0000 at [redacted]w[redacted]d here for IOL for gHTN. [place] labs within normal limits. No severe range pressures.   Labor: Induction. Patient with excellent cervical effacement and dilation off one cytotec. Started Pitocin at 4 cm. Now 6 cm on 6 mu/min of Pit with adequate contraction pattern. Continue to monitor.  Fetal Wellbeing:  Cat II. Overall reassuring with acels and moderate variability.  Pain Control:  Does not plan for epidural  Anticipated MOD:  NSVD   Phill Myron, D.O. OB Fellow  11/15/2019, 4:53 AM

## 2019-11-15 NOTE — Anesthesia Procedure Notes (Signed)
Epidural Patient location during procedure: OB Start time: 11/15/2019 9:37 PM End time: 11/15/2019 9:42 PM  Staffing Anesthesiologist: Janeece Riggers, MD  Preanesthetic Checklist Completed: patient identified, site marked, surgical consent, pre-op evaluation, timeout performed, IV checked, risks and benefits discussed and monitors and equipment checked  Epidural Patient position: sitting Prep: site prepped and draped and DuraPrep Patient monitoring: continuous pulse ox and blood pressure Approach: midline Location: L3-L4 Injection technique: LOR air  Needle:  Needle type: Tuohy  Needle gauge: 17 G Needle length: 9 cm and 9 Needle insertion depth: 5 cm cm Catheter type: closed end flexible Catheter size: 19 Gauge Catheter at skin depth: 10 cm Test dose: negative  Assessment Events: blood not aspirated, injection not painful, no injection resistance, negative IV test and no paresthesia

## 2019-11-15 NOTE — Progress Notes (Signed)
Labor Progress Note Andrea Archer is a 18 y.o. G1P0000 at [redacted]w[redacted]d presented for IOL for new onset gHTN  S:  Patient sleeping  O:  BP 127/79   Pulse 78   Temp 98 F (36.7 C) (Oral)   Resp 16   Ht 5\' 7"  (1.702 m)   Wt 90.3 kg   LMP 02/06/2019   SpO2 100%   BMI 31.18 kg/m   Fetal Tracing:  Baseline: 140 Variability: moderate Accels: 15x15 Decels: none  Toco: 2-6   CVE: Dilation: 1 Effacement (%): 60 Cervical Position: Posterior Station: 0 Presentation: Vertex Exam by:: Chrys Racer, CNM   A&P: 18 y.o. G1P0000 [redacted]w[redacted]d IOL new gHTN, asymptomatic #Labor: Progressing well. Declines FB. Will continue cytotec #Pain: per patient request #FWB: Cat 1 #GBS negative  Wende Mott, CNM 9:59 AM

## 2019-11-15 NOTE — Progress Notes (Signed)
LABOR PROGRESS NOTE  Fleurette Graffeo is a 18 y.o. G1P0000 at [redacted]w[redacted]d  admitted for IOL for new onset gHTN.   Subjective: Patient is feeling some pressure. Went to assess if ROM possible.   Objective: BP 140/84   Pulse 69   Temp 98 F (36.7 C) (Oral)   Resp 18   Ht 5\' 7"  (1.702 m)   Wt 90.3 kg   LMP 02/06/2019   SpO2 100%   BMI 31.18 kg/m  or  Vitals:   11/15/19 0334 11/15/19 0412 11/15/19 0442 11/15/19 0504  BP: 127/66 (!) 144/80 136/74 140/84  Pulse: 79 66 77 69  Resp: 18 16 18 18   Temp: 98 F (36.7 C)     TempSrc: Oral     SpO2:      Weight:      Height:        Dilation: 6.5 Effacement (%): 90 Cervical Position: Middle Station: Plus 1 Presentation: Vertex Exam by:: Lenise Herald RN FHT: baseline rate 145, moderate varibility, +acel, variable decel Toco: q1-3 min   Labs: Lab Results  Component Value Date   WBC 12.1 (H) 11/14/2019   HGB 11.2 (L) 11/14/2019   HCT 34.0 (L) 11/14/2019   MCV 79.1 (L) 11/14/2019   PLT 266 11/14/2019    Patient Active Problem List   Diagnosis Date Noted  . Post term pregnancy at [redacted] weeks gestation 11/14/2019  . Gestational hypertension 11/14/2019  . Sickle cell trait ([place]) 05/04/2019  . Supervision of other normal pregnancy, antepartum 04/20/2019  . Change in bowel habits 06/18/2018  . Left lower quadrant pain 06/17/2018    Assessment / Plan: 18 y.o. G1P0000 at [redacted]w[redacted]d here for IOL for gHTN. [place] labs within normal limits. No severe range pressures.   Labor: Upon assessment of patient, fetal station is very low however cervix remains thick and posterior. Will turn off Pitocin and resume Cytotec 50 mg buccal.  Fetal Wellbeing:  Cat II. Overall reassuring with acels and moderate variability.  Pain Control:  Does not plan for epidural  Anticipated MOD:  NSVD   Phill Myron, D.O. OB Fellow  11/15/2019, 5:26 AM

## 2019-11-15 NOTE — Anesthesia Preprocedure Evaluation (Signed)
Anesthesia Evaluation  Patient identified by MRN, date of birth, ID band Patient awake    Reviewed: Allergy & Precautions, H&P , NPO status , Patient's Chart, lab work & pertinent test results, reviewed documented beta blocker date and time   Airway Mallampati: III  TM Distance: >3 FB Neck ROM: full    Dental no notable dental hx. (+) Teeth Intact, Dental Advisory Given   Pulmonary neg pulmonary ROS, former smoker,    Pulmonary exam normal breath sounds clear to auscultation       Cardiovascular hypertension, Normal cardiovascular exam Rhythm:regular Rate:Normal     Neuro/Psych negative neurological ROS  negative psych ROS   GI/Hepatic negative GI ROS, Neg liver ROS,   Endo/Other  Morbid obesity  Renal/GU negative Renal ROS  negative genitourinary   Musculoskeletal   Abdominal   Peds  Hematology  (+) Blood dyscrasia, Sickle cell trait and anemia ,   Anesthesia Other Findings   Reproductive/Obstetrics (+) Pregnancy                             Anesthesia Physical Anesthesia Plan  ASA: III  Anesthesia Plan: Epidural   Post-op Pain Management:    Induction:   PONV Risk Score and Plan:   Airway Management Planned:   Additional Equipment:   Intra-op Plan:   Post-operative Plan:   Informed Consent: I have reviewed the patients History and Physical, chart, labs and discussed the procedure including the risks, benefits and alternatives for the proposed anesthesia with the patient or authorized representative who has indicated his/her understanding and acceptance.       Plan Discussed with:   Anesthesia Plan Comments:         Anesthesia Quick Evaluation

## 2019-11-15 NOTE — Progress Notes (Signed)
Andrea Archer is a 18 y.o. G1P0000 at [redacted]w[redacted]d by LMP admitted for induction of labor due to [facility].  Subjective: Pt coping well with contractions with IV pain medication and s/o at bedside for support.  Objective: BP 135/84   Pulse 85   Temp 98.6 F (37 C) (Oral)   Resp (!) 22   Ht 5\' 7"  (1.702 m)   Wt 90.3 kg   LMP 02/06/2019   SpO2 100%   BMI 31.18 kg/m  No intake/output data recorded. No intake/output data recorded.  FHT:  FHR: 135 bpm, variability: moderate,  accelerations:  Present,  decelerations:  Absent UC:   regular, every 3-4 minutes SVE:   6/60/-2 AROM with clear fluid.  Pt tolerated well.  Labs: Lab Results  Component Value Date   WBC 12.1 (H) 11/14/2019   HGB 11.2 (L) 11/14/2019   HCT 34.0 (L) 11/14/2019   MCV 79.1 (L) 11/14/2019   PLT 266 11/14/2019    Assessment / Plan: Induction of labor due to gestational hypertension S/P Cytotec and Foley bulb  Labor: Discussed options with pt including expectant management and AROM. Pt coping well but concerned that she cannot keep coping if things go slowly.  Pt and s/o discussed and pt elects to have AROM at this time. Preeclampsia:  labs stable Fetal Wellbeing:  Category I Pain Control:  Labor support without medications I/D:  GBS neg Anticipated MOD:  NSVD  Fatima Blank 11/15/2019, 7:34 PM

## 2019-11-16 ENCOUNTER — Encounter (HOSPITAL_COMMUNITY): Payer: Self-pay

## 2019-11-16 ENCOUNTER — Encounter: Payer: Medicaid Other | Admitting: Advanced Practice Midwife

## 2019-11-16 DIAGNOSIS — O134 Gestational [pregnancy-induced] hypertension without significant proteinuria, complicating childbirth: Secondary | ICD-10-CM

## 2019-11-16 DIAGNOSIS — O480 Post-term pregnancy: Secondary | ICD-10-CM

## 2019-11-16 DIAGNOSIS — Z3A40 40 weeks gestation of pregnancy: Secondary | ICD-10-CM

## 2019-11-16 LAB — CBC
HCT: 30.5 % — ABNORMAL LOW (ref 36.0–46.0)
Hemoglobin: 10.3 g/dL — ABNORMAL LOW (ref 12.0–15.0)
MCH: 26.3 pg (ref 26.0–34.0)
MCHC: 33.8 g/dL (ref 30.0–36.0)
MCV: 78 fL — ABNORMAL LOW (ref 80.0–100.0)
Platelets: 233 10*3/uL (ref 150–400)
RBC: 3.91 MIL/uL (ref 3.87–5.11)
RDW: 16.2 % — ABNORMAL HIGH (ref 11.5–15.5)
WBC: 18.6 10*3/uL — ABNORMAL HIGH (ref 4.0–10.5)
nRBC: 0 % (ref 0.0–0.2)

## 2019-11-16 MED ORDER — DIPHENHYDRAMINE HCL 25 MG PO CAPS: 25.0000 mg | ORAL_CAPSULE | Freq: Four times a day (QID) | ORAL | Status: DC | PRN

## 2019-11-16 MED ORDER — WITCH HAZEL-GLYCERIN EX PADS: 1.0000 "application " | MEDICATED_PAD | CUTANEOUS | Status: DC | PRN

## 2019-11-16 MED ORDER — ONDANSETRON HCL 4 MG PO TABS: 4.0000 mg | ORAL_TABLET | ORAL | Status: DC | PRN

## 2019-11-16 MED ORDER — PRENATAL MULTIVITAMIN CH
1.0000 | ORAL_TABLET | Freq: Every day | ORAL | Status: DC
  Administered 2019-11-16 – 2019-11-17 (×2): 1 via ORAL
  Filled 2019-11-16 (×2): qty 1

## 2019-11-16 MED ORDER — DIBUCAINE (PERIANAL) 1 % EX OINT: 1.0000 "application " | TOPICAL_OINTMENT | CUTANEOUS | Status: DC | PRN

## 2019-11-16 MED ORDER — SIMETHICONE 80 MG PO CHEW: 80.0000 mg | CHEWABLE_TABLET | ORAL | Status: DC | PRN

## 2019-11-16 MED ORDER — IBUPROFEN 600 MG PO TABS
600.0000 mg | ORAL_TABLET | Freq: Four times a day (QID) | ORAL | Status: DC
  Administered 2019-11-16 – 2019-11-17 (×5): 600 mg via ORAL
  Filled 2019-11-16 (×5): qty 1

## 2019-11-16 MED ORDER — ONDANSETRON HCL 4 MG/2ML IJ SOLN: 4.0000 mg | INTRAMUSCULAR | Status: DC | PRN

## 2019-11-16 MED ORDER — COCONUT OIL OIL
1.0000 "application " | TOPICAL_OIL | Status: DC | PRN
  Administered 2019-11-17: 1 via TOPICAL

## 2019-11-16 MED ORDER — BENZOCAINE-MENTHOL 20-0.5 % EX AERO
1.0000 "application " | INHALATION_SPRAY | CUTANEOUS | Status: DC | PRN
  Administered 2019-11-16: 1 via TOPICAL
  Filled 2019-11-16: qty 56

## 2019-11-16 MED ORDER — ACETAMINOPHEN 325 MG PO TABS: 650.0000 mg | ORAL_TABLET | ORAL | Status: DC | PRN

## 2019-11-16 MED ORDER — TETANUS-DIPHTH-ACELL PERTUSSIS 5-2.5-18.5 LF-MCG/0.5 IM SUSP: 0.5000 mL | Freq: Once | INTRAMUSCULAR | Status: DC

## 2019-11-16 MED ORDER — SENNOSIDES-DOCUSATE SODIUM 8.6-50 MG PO TABS
2.0000 | ORAL_TABLET | ORAL | Status: DC
  Administered 2019-11-16: 2 via ORAL
  Filled 2019-11-16: qty 2

## 2019-11-16 MED ORDER — ZOLPIDEM TARTRATE 5 MG PO TABS: 5.0000 mg | ORAL_TABLET | Freq: Every evening | ORAL | Status: DC | PRN

## 2019-11-16 NOTE — Progress Notes (Signed)
MOB was referred for history of depression/anxiety.  * Referral screened out by Clinical Social Worker because none of the following criteria appear to apply:  ~ History of anxiety/depression during this pregnancy, or of post-partum depression following prior delivery. ~ Diagnosis of anxiety and/or depression within last 3 years OR * MOB's symptoms currently being treated with medication and/or therapy.  Please contact the Clinical Social Worker if needs arise or by MOB request.  Elijio Miles, [place]  [facility] and [employer] [phone]

## 2019-11-16 NOTE — Discharge Summary (Signed)
Postpartum Discharge Summary       Patient Name: Andrea Archer DOB: [birth year] MRN: [id number]  Date of admission: 11/14/2019 Delivering Provider: Gavin Pound   Date of discharge: 11/17/2019  Admitting diagnosis: CTX 45mn Intrauterine pregnancy: 483w3d   Secondary diagnosis:  Principal Problem:   Normal vaginal delivery Active Problems:   Sickle cell trait ([place]  Post term pregnancy at [redacted] weeks gestation   Gestational hypertension   Obstetrical laceration  Additional problems: None     Discharge diagnosis: Term Pregnancy Delivered and Gestational Hypertension                                                                                                Post partum procedures:None  Augmentation: Induction of Labor with cytotec x 4, Foley Bulb, and Pitocin  Complications: None  Hospital course:  Induction of Labor With Vaginal Delivery   [age].o. yo G1P0000 at 4077w3ds admitted to the hospital 11/14/2019 for induction of labor.  Indication for induction: Gestational hypertensionthat was discovered when patient came in for a labor check.  She was given 4 doses of cytotec, had a foley placed, and progressed to complete with pitocin.  She then delivered with a Code Apgar being called for fetus. Patient had an uncomplicated labor course as follows: Membrane Rupture Time/Date: 4:40 PM ,11/15/2019   Intrapartum Procedures: Episiotomy: None [1]                                         Lacerations:    Patient had delivery of a Viable infant.  Information for the patient's newborn:  BakJerolyn, Flenn[place]3[[id number]]elivery Method: Vaginal, Spontaneous(Filed from Delivery Summary)    11/16/2019  Details of delivery can be found in separate delivery note.  Patient had a routine postpartum course. Patient is discharged home 11/17/19. Delivery time: 5:07 AM    Magnesium Sulfate received: No BMZ received: No Rhophylac:N/A MMR:N/A Transfusion:No  Physical exam  Vitals:   11/16/19 0900 11/16/19 1300 11/16/19 2110 11/17/19 0537  BP: 118/80 125/82 120/75 126/76  Pulse: 77 80 82 88  Resp:   17 17  Temp: 98 F (36.7 C) 98.1 F (36.7 C) 98 F (36.7 C) 98 F (36.7 C)  TempSrc: Oral Oral Oral Oral  SpO2: 99% 100% 100% 100%  Weight:      Height:       General: alert, cooperative and no distress Lochia: appropriate Uterine Fundus: firm Incision: N/A DVT Evaluation: No evidence of DVT seen on physical exam. Labs: Lab Results  Component Value Date   WBC 18.6 (H) 11/16/2019   HGB 10.3 (L) 11/16/2019   HCT 30.5 (L) 11/16/2019   MCV 78.0 (L) 11/16/2019   PLT 233 11/16/2019   CMP Latest Ref Rng & Units 11/14/2019  Glucose 70 - 99 mg/dL 98  BUN 6 - 20 mg/dL <5(L)  Creatinine 0.44 - 1.00 mg/dL 0.81  Sodium 135 - 145 mmol/L 137  Potassium 3.5 - 5.1 mmol/L 3.8  Chloride 98 - 111 mmol/L 106  CO2 22 - 32 mmol/L 20(L)  Calcium 8.9 - 10.3 mg/dL 9.3  Total Protein 6.5 - 8.1 g/dL 6.2(L)  Total Bilirubin 0.3 - 1.2 mg/dL 0.5  Alkaline Phos 38 - 126 U/L 127(H)  AST 15 - 41 U/L 21  ALT 0 - 44 U/L 15    Discharge instruction: per After Visit Summary and "Baby and Me Booklet".  After visit meds:  Allergies as of 11/17/2019      Reactions   Mushroom Extract Complex Rash   Pineapple Rash      Medication List    STOP taking these medications   Doxylamine-Pyridoxine 10-10 MG Tbec Commonly known as: Diclegis     TAKE these medications   diphenhydrAMINE 25 mg capsule Commonly known as: BENADRYL Take 25 mg by mouth every 6 (six) hours as needed for allergies.   ibuprofen 800 MG tablet Commonly known as: ADVIL Take 1 tablet (800 mg total) by mouth every 8 (eight) hours as needed.   Misc. Devices Misc Dispense one maternity belt for patient   PRENATAL 1 PO Take 1 tablet by mouth daily.       Diet: routine diet  Activity: Advance as tolerated. Pelvic rest for 6 weeks.   Outpatient follow up:6 weeks Follow up Appt: Future Appointments  Date  Time Provider [place]  11/18/2019  8:50 AM [facility] None  11/23/2019  9:30 AM [facility] NURSE [facility] None  12/21/2019  1:15 PM Left[place]-Kirby, Kathie Dike, CNM [facility] None   Follow up Visit: Appt Request Sent 11/17/2019 10:25 AM    Please schedule this patient for Postpartum visit in: 6 weeks with the following provider: Any provider For C/S patients schedule nurse incision check in weeks 2 weeks: N/A Low risk pregnancy complicated by: gHTN at 82MEBRA Delivery mode:  SVD Anticipated Birth Control:  None PP Procedures needed: BP check  Schedule Integrated [place] visit: no      Newborn Data: Live born female-Kelaya Pearl Birth Weight:  3286 G APGAR: , 9  Newborn Delivery   Time head delivered: 11/16/2019 05:06:34 Birth date/time: 11/16/2019 05:07:00 Delivery type: Vaginal, Spontaneous      Baby Feeding: Breast Disposition:home with mother   Marcille Buffy DNP, CNM  11/17/19  10:26 AM

## 2019-11-16 NOTE — Progress Notes (Signed)
Andrea Archer MRN: [id number]  Subjective: -Nurse call to reports fetal deceleration while pushing.  In room to assess.  Patient reports pressure.   Objective: BP 122/67   Pulse 80   Temp 97.9 F (36.6 C) (Oral)   Resp 14   Ht 5\' 7"  (1.702 m)   Wt 90.3 kg   LMP 02/06/2019   SpO2 97%   BMI 31.18 kg/m  No intake/output data recorded. Total I/O In: -  Out: 450 [Urine:450]  Fetal Monitoring: FHT: 145 bpm, Mod Var, +Prolonged Decels x 4 min, +Accels UC: Q2-14min    Vaginal Exam: SVE:   Dilation: 10 Effacement (%): 100 Station: Plus 2 Exam by:: C Conley Canal RN Membranes:AROM Internal Monitors: None  Augmentation/Induction: Pitocin:20mUn/min Cytotec: S/P 4 Doses  Assessment:  IUP at 40.3 weeks Cat II FT  2nd Stage   Plan: -Resuscitative measures: *O2 at 10L *Position change to side lying with leg in stirrup -Resolution of Cat II FT noted, but returns with pushing efforts. -Discussed plan to labor down for 30-45 min and reassess. -Reviewed possible usage of vacuum, by MD, to expedite delivery if fetal intolerance continues to be noted. -Plan to gradually decrease O2. -Will return and start pushing.  -Continue other mgmt as ordered.  Riley Churches, CNM Advanced Practice Provider, [facility] [place] 11/16/2019, 2:41 AM

## 2019-11-16 NOTE — Anesthesia Postprocedure Evaluation (Signed)
Anesthesia Post Note  Patient: [name]  Procedure(s) Performed: AN AD [place]     Patient location during evaluation: Mother Baby Anesthesia Type: Epidural Level of consciousness: awake Pain management: satisfactory to patient Vital Signs Assessment: post-procedure vital signs reviewed and stable Respiratory status: spontaneous breathing Cardiovascular status: stable Anesthetic complications: no    Last Vitals:  Vitals:   11/16/19 0900 11/16/19 1300  BP: 118/80 125/82  Pulse: 77 80  Resp:    Temp: 36.7 C 36.7 C  SpO2: 99% 100%    Last Pain:  Vitals:   11/16/19 1300  TempSrc: Oral  PainSc: 0-No pain   Pain Goal: Patients Stated Pain Goal: 5 (11/15/19 2038)                 [name]

## 2019-11-16 NOTE — Lactation Note (Signed)
This note was copied from a baby's chart. Lactation Consultation Note  Patient Name: Andrea Archer [id number] Date: 11/16/2019 Reason for consult: Initial assessment;Primapara;Term Randel Books is 1 hours old.  Baby has had a few attempts at breast.  Mom can easily express colostrum and leaking.  Currently baby is sleeping in FOB's arms sucking on a pacifier.  RN has already educated parents on the risks of early pacifier use.  I reviewed education but mom feels it is teaching her to suck.  Assisted with positioning baby in football hold on left side.  Nipples semi flat but breast is easily compressible.  Baby does not open wide.  Breast tissue compressed well and baby latched easily.  Instructed on breast massage and compression to increase milk flow.  Basic teaching done.  Instructed to feed with feeding cues and call for assist prn.  Breastfeeding consultation services information given and reviewed.  Maternal Data Has patient been taught Hand Expression?: Yes Does the patient have breastfeeding experience prior to this delivery?: No  Feeding Feeding Type: Breast Fed  LATCH Score Latch: Grasps breast easily, tongue down, lips flanged, rhythmical sucking.  Audible Swallowing: None  Type of Nipple: Everted at rest and after stimulation  Comfort (Breast/Nipple): Soft / non-tender  Hold (Positioning): Assistance needed to correctly position infant at breast and maintain latch.  LATCH Score: 7  Interventions Interventions: Breast feeding basics reviewed;Adjust position;Assisted with latch;Support pillows;Breast massage;Hand express  Lactation Tools Discussed/Used     Consult Status Consult Status: Follow-up Date: 11/17/19 Follow-up type: In-patient    Ave Filter 11/16/2019, 2:56 PM

## 2019-11-16 NOTE — Progress Notes (Signed)
Andrea Archer MRN: [id number]  Subjective: -Patient reports comfort s/p epidural placement.  States she feels very excited and full of energy.   Objective: BP 128/90   Pulse 85   Temp 97.9 F (36.6 C) (Oral)   Resp 16   Ht 5\' 7"  (1.702 m)   Wt 90.3 kg   LMP 02/06/2019   SpO2 97%   BMI 31.18 kg/m  No intake/output data recorded. No intake/output data recorded.  Fetal Monitoring: FHT: 145 bpm, Mod Var, +Early and Variable Decels, -Accels UC: Q2-70min, palpates moderate    Vaginal Exam: SVE:   Dilation: Lip/rim Effacement (%): 100 Station: 0 Exam by:: C Conley Canal RN Membranes:AROM x 8 hrs Internal Monitors: None  Augmentation/Induction: Pitocin:9mUn/min Cytotec: S/P x4   Assessment:  IUP at 40.3 weeks Cat II FT  IOl s/t gHTN  Plan: -Patient encouraged to rest in preparation for pushing phase. -Position change for resolution of Cat II FT -Continue other mgmt as ordered. -Anticipate NVD.   Riley Churches, CNM Advanced Practice Provider, [facility] [place] 11/16/2019, 1:12 AM

## 2019-11-17 MED ORDER — IBUPROFEN 800 MG PO TABS: 800.0000 mg | ORAL_TABLET | Freq: Three times a day (TID) | ORAL | 0 refills | Status: DC | PRN

## 2019-11-17 NOTE — Lactation Note (Signed)
This note was copied from a baby's chart. Lactation Consultation Note  Patient Name: Andrea Archer Today's Date: 11/17/2019   At beginning of consult, baby was sucking on a pacifier. Parents had assumed that infant was full. I explained to parents that if infant needs a pacifier after feeding, then baby is not full. The mom removed the pacifier & baby was frantic. Infant was unable to latch due to being so frantic.  Hand expression was taught to Mom so that infant could be fed a small amount to be calmed. Finger-feeding was attempted, but at that time the infant wanted to chew & not suck on the finger.  Spoon-feeding was done, but infant could not become proficient at it.   After multiple latch attempts (including with a size 20 nipple shield), bottle feeding with formula was done with pacing & the extra-slow flow nipple. Soon after offering the bottle, infant was able to utilize sucking instead of chewing.   Dad was shown how to assemble & use hand pump (single- & double-mode) that was included in pump kit. Size 21 flanges provided.   Mom says MGM will assist with breastfeeding once she gets home.   Matthias Hughs [facility] 11/17/2019, 2:55 PM

## 2019-11-18 ENCOUNTER — Other Ambulatory Visit (HOSPITAL_COMMUNITY): Payer: Medicaid Other

## 2019-11-20 ENCOUNTER — Inpatient Hospital Stay (HOSPITAL_COMMUNITY): Payer: Medicaid Other

## 2019-11-20 ENCOUNTER — Inpatient Hospital Stay (HOSPITAL_COMMUNITY)
Admission: AD | Admit: 2019-11-20 | Payer: Medicaid Other | Source: Home / Self Care | Admitting: Obstetrics and Gynecology

## 2019-11-23 ENCOUNTER — Ambulatory Visit: Payer: Medicaid Other

## 2019-11-23 ENCOUNTER — Other Ambulatory Visit: Payer: Self-pay

## 2019-11-23 VITALS — BP 123/83 | HR 79 | Wt 179.4 lb

## 2019-11-23 DIAGNOSIS — Z0130 Encounter for examination of blood pressure without abnormal findings: Secondary | ICD-10-CM

## 2019-11-23 NOTE — Progress Notes (Signed)
..  Subjective:  Andrea Archer is a 18 y.o. female here for BP check.   Hypertension ROS: Patient is currently not taking any BP meds, denies any abnormal symptoms.  Objective:  BP 123/83   Pulse 79   Wt 179 lb 6.4 oz (81.4 kg)   LMP 02/06/2019   Breastfeeding Yes   BMI 28.10 kg/m   Appearance alert, well appearing, and in no distress. General exam BP noted to be well controlled today in office.    Assessment:   Blood Pressure well controlled.   Plan:  Current treatment plan is effective, no change in therapy.. PP visit scheduled for 12-21-19

## 2019-11-23 NOTE — Progress Notes (Signed)
Patient seen and assessed by nursing staff during this encounter. I have reviewed the chart and agree with the documentation and plan.  Mora Bellman, MD 11/23/2019 10:46 AM

## 2019-12-02 ENCOUNTER — Ambulatory Visit: Payer: Medicaid Other | Admitting: Obstetrics

## 2019-12-02 ENCOUNTER — Telehealth: Payer: Self-pay

## 2019-12-02 NOTE — Telephone Encounter (Signed)
TC to pt to make aware per Dr.Harper to go to hospital for an evaluation for possible Spinal HA. Pt voiced understanding and agreeable to go for a evaluation today.

## 2019-12-08 ENCOUNTER — Encounter: Payer: Self-pay | Admitting: Obstetrics

## 2019-12-08 ENCOUNTER — Other Ambulatory Visit: Payer: Self-pay

## 2019-12-08 ENCOUNTER — Ambulatory Visit (INDEPENDENT_AMBULATORY_CARE_PROVIDER_SITE_OTHER): Payer: Medicaid Other | Admitting: Obstetrics

## 2019-12-08 DIAGNOSIS — Z1389 Encounter for screening for other disorder: Secondary | ICD-10-CM | POA: Diagnosis not present

## 2019-12-08 DIAGNOSIS — Z3009 Encounter for other general counseling and advice on contraception: Secondary | ICD-10-CM

## 2019-12-08 DIAGNOSIS — R519 Headache, unspecified: Secondary | ICD-10-CM

## 2019-12-08 MED ORDER — BUTALBITAL-APAP-CAFFEINE 50-325-40 MG PO TABS: 1.0000 | ORAL_TABLET | Freq: Four times a day (QID) | ORAL | 2 refills | Status: DC | PRN

## 2019-12-08 MED ORDER — VITAFOL GUMMIES 3.33-0.333-34.8 MG PO CHEW: 3.0000 | CHEWABLE_TABLET | Freq: Every day | ORAL | 11 refills | Status: DC

## 2019-12-08 NOTE — Progress Notes (Addendum)
   Andrea Archer is a 18 y.o. G1P1001 female who presents for a postpartum visit. She is 3 weeks postpartum following a spontaneous vaginal delivery. I have fully reviewed the prenatal and intrapartum course. The delivery was at 40+ gestational weeks.  Anesthesia: epidural. Postpartum course has been having HA's and visual changes. Baby's course has been doing well. Baby is feeding by both breast and bottle - Similac Sensitive RS. Bleeding no bleeding. Bowel function is normal. Bladder function is normal. Patient is not sexually active. Contraception method is abstinence.  Postpartum depression screening:neg, score 0.  The following portions of the patient's history were reviewed and updated as appropriate: allergies, current medications, past family history, past medical history, past social history, past surgical history and problem list. Last pap smear done     and was Normal  Review of Systems A comprehensive review of systems was negative.    Objective:  Blood pressure 123/77, pulse 79, weight 175 lb (79.4 kg), last menstrual period 02/06/2019, currently breastfeeding.  General:  alert and no distress   Breasts:  inspection negative, no nipple discharge or bleeding, no masses or nodularity palpable  Lungs: clear to auscultation bilaterally  Heart:  regular rate and rhythm, S1, S2 normal, no murmur, click, rub or gallop  Abdomen: soft, non-tender; bowel sounds normal; no masses,  no organomegaly   Assessment:    1. Postpartum care following vaginal delivery Rx: - Prenatal Vit-Fe Phos-FA-Omega (VITAFOL GUMMIES) 3.33-0.333-34.8 MG CHEW; Chew 3 tablets by mouth daily before breakfast.  Dispense: 90 tablet; Refill: 11  2. Headache disorder Rx: - butalbital-acetaminophen-caffeine (FIORICET) 50-325-40 MG tablet; Take 1-2 tablets by mouth every 6 (six) hours as needed for headache.  Dispense: 30 tablet; Refill: 2  3. Encounter for other general counseling or advice on contraception -  declines contraception  Plan:   1. Contraception: considering non-estrogen options 2. Continue PNV's 3. Follow up in: 6 months for contraceptive surveillance  Shelly Bombard, MD 12/08/2019 3:40 PM

## 2019-12-21 ENCOUNTER — Other Ambulatory Visit: Payer: Self-pay

## 2019-12-21 ENCOUNTER — Telehealth (INDEPENDENT_AMBULATORY_CARE_PROVIDER_SITE_OTHER): Payer: Medicaid Other | Admitting: Advanced Practice Midwife

## 2019-12-21 DIAGNOSIS — O26899 Other specified pregnancy related conditions, unspecified trimester: Secondary | ICD-10-CM

## 2019-12-21 DIAGNOSIS — Z3009 Encounter for other general counseling and advice on contraception: Secondary | ICD-10-CM

## 2019-12-21 DIAGNOSIS — G5600 Carpal tunnel syndrome, unspecified upper limb: Secondary | ICD-10-CM

## 2019-12-21 DIAGNOSIS — Z8759 Personal history of other complications of pregnancy, childbirth and the puerperium: Secondary | ICD-10-CM | POA: Insufficient documentation

## 2019-12-21 MED ORDER — WRIST BRACE/RIGHT SMALL MISC: 1.0000 | Freq: Every day | 0 refills | Status: DC

## 2019-12-21 NOTE — Progress Notes (Signed)
TELEHEALTH POSTPARTUM VIRTUAL VIDEO VISIT ENCOUNTER NOTE   Provider location: [facility] for [facility] at [place]   I connected with Andrea Archer on 12/21/19 at  1:15 PM EST by MyChart Video Encounter at home and verified that I am speaking with the correct person using two identifiers.    I discussed the limitations, risks, security and privacy concerns of performing an evaluation and management service virtually and the availability of in person appointments. I also discussed with the patient that there may be a patient responsible charge related to this service. The patient expressed understanding and agreed to proceed.  Chief Complaint: Postpartum Visit  History of Present Illness: Andrea Archer is a 18 y.o. African-American G1P1001 being evaluated for postpartum followup.    She is s/p normal spontaneous vaginal delivery on 11/16/19 at [redacted]w[redacted]d; she was discharged to home on 11/17/19. Pregnancy complicated by [facility] with normal BP not on meds at postpartum check. Baby is doing well.  Complains of wrist pain bilaterally  Vaginal bleeding or discharge: No  Intercourse: No  Contraception: no method Mode of feeding infant: Breast PP depression s/s: No .  Any bowel or bladder issues: No  Pap smear: n/a due to young age  Review of Systems:  Her 12 point review of systems is negative or as noted in the History of Present Illness.  Patient Active Problem List   Diagnosis Date Noted  . Normal vaginal delivery 11/16/2019  . Obstetrical laceration 11/16/2019  . Post term pregnancy at [redacted] weeks gestation 11/14/2019  . Gestational hypertension 11/14/2019  . Sickle cell trait ([place]) 05/04/2019  . Supervision of other normal pregnancy, antepartum 04/20/2019  . Change in bowel habits 06/18/2018  . Left lower quadrant pain 06/17/2018    Medications Andrea Archer "Andrea Archer" had no medications administered during this visit. Current Outpatient Medications  Medication Sig Dispense  Refill  . butalbital-acetaminophen-caffeine (FIORICET) 50-325-40 MG tablet Take 1-2 tablets by mouth every 6 (six) hours as needed for headache. 30 tablet 2  . diphenhydrAMINE (BENADRYL) 25 mg capsule Take 25 mg by mouth every 6 (six) hours as needed for allergies.    Marland Kitchen ibuprofen (ADVIL) 800 MG tablet Take 1 tablet (800 mg total) by mouth every 8 (eight) hours as needed. 30 tablet 0  . Prenatal MV-Min-Fe Fum-FA-DHA (PRENATAL 1 PO) Take 1 tablet by mouth daily.     . Prenatal Vit-Fe Phos-FA-Omega (VITAFOL GUMMIES) 3.33-0.333-34.8 MG CHEW Chew 3 tablets by mouth daily before breakfast. 90 tablet 11   No current facility-administered medications for this visit.    Allergies Mushroom extract complex and Pineapple  Physical Exam:  LMP 02/06/2019   General:  Alert, oriented and cooperative. Patient is in no acute distress.  Mental Status: Normal mood and affect. Normal behavior. Normal judgment and thought content.   Respiratory: Normal respiratory effort noted, no problems with respiration noted  Rest of physical exam deferred due to type of encounter  PP Depression Screening:   Edinburgh Postnatal Depression Scale Screening Tool 12/21/2019 12/08/2019 11/16/2019  I have been able to laugh and see the funny side of things. 0 0 0  I have looked forward with enjoyment to things. 0 0 0  I have blamed myself unnecessarily when things went wrong. 0 0 2  I have been anxious or worried for no good reason. 0 0 2  I have felt scared or panicky for no good reason. 0 0 0  Things have been getting on top of me. 0 0  1  I have been so unhappy that I have had difficulty sleeping. 0 0 1  I have felt sad or miserable. 0 0 1  I have been so unhappy that I have been crying. 0 0 2  The thought of harming myself has occurred to me. 0 0 0  Edinburgh Postnatal Depression Scale Total 0 0 9     Assessment:Patient is a 18 y.o. G1P1001 who is 5 weeks postpartum from a normal spontaneous vaginal delivery.  She is  doing well.   Plan:  1. Carpal tunnel syndrome during pregnancy --Carpal tunnel during pregnancy continuing and worsening now postpartum.  Pt sleeps with right hand bent at her face.  She has a left wrist brace which helps but none for the right. - Elastic Bandages & Supports (WRIST BRACE/RIGHT SMALL) MISC; 1 Device by Does not apply route daily.  Dispense: 1 each; Refill: 0  2. Postpartum care following vaginal delivery --Doing well, bonding with infant, good support at home.   3. Encounter for counseling regarding contraception --Pt not interested in hormonal contraception.  Is Ok with whatever happens.  She is breastfeeding frequently so discussed lactational amenorrhea, effectiveness and drawbacks.  Encouraged pt to use condoms, withdrawal, or notify office if she desires more to prevent pregnancy.  Pt states understanding.  RTC 1 year  I discussed the assessment and treatment plan with the patient. The patient was provided an opportunity to ask questions and all were answered. The patient agreed with the plan and demonstrated an understanding of the instructions.   The patient was advised to call back or seek an in-person evaluation/go to the ED for any concerning postpartum symptoms.  I provided 10 minutes of face-to-face time during this encounter.   Fatima Blank, [place] for [employer], [place]

## 2020-02-03 ENCOUNTER — Telehealth: Payer: Self-pay

## 2020-02-03 NOTE — Telephone Encounter (Signed)
Pt called and reports that she is having chest tightness, leg pain, headaches, and seeing "black spots". I advised pt to go to the emergency room for evaluation. Pt voices understanding and says she will go.

## 2020-02-06 ENCOUNTER — Other Ambulatory Visit: Payer: Self-pay

## 2020-02-06 ENCOUNTER — Emergency Department (HOSPITAL_COMMUNITY): Payer: Medicaid Other

## 2020-02-06 ENCOUNTER — Emergency Department (HOSPITAL_COMMUNITY)
Admission: EM | Admit: 2020-02-06 | Discharge: 2020-02-07 | Disposition: A | Discharge status: Home / Self Care | Payer: Medicaid Other | Attending: Emergency Medicine | Admitting: Emergency Medicine

## 2020-02-06 DIAGNOSIS — Z87891 Personal history of nicotine dependence: Secondary | ICD-10-CM | POA: Insufficient documentation

## 2020-02-06 DIAGNOSIS — Z79899 Other long term (current) drug therapy: Secondary | ICD-10-CM | POA: Diagnosis not present

## 2020-02-06 DIAGNOSIS — R079 Chest pain, unspecified: Secondary | ICD-10-CM

## 2020-02-06 DIAGNOSIS — R0789 Other chest pain: Secondary | ICD-10-CM | POA: Insufficient documentation

## 2020-02-06 DIAGNOSIS — R1032 Left lower quadrant pain: Secondary | ICD-10-CM | POA: Insufficient documentation

## 2020-02-06 DIAGNOSIS — R030 Elevated blood-pressure reading, without diagnosis of hypertension: Secondary | ICD-10-CM | POA: Diagnosis present

## 2020-02-06 LAB — URINALYSIS, ROUTINE W REFLEX MICROSCOPIC
Bacteria, UA: NONE SEEN
Bilirubin Urine: NEGATIVE
Glucose, UA: NEGATIVE mg/dL
Hgb urine dipstick: NEGATIVE
Ketones, ur: NEGATIVE mg/dL
Nitrite: NEGATIVE
Protein, ur: NEGATIVE mg/dL
Specific Gravity, Urine: 1.014 (ref 1.005–1.030)
pH: 5 (ref 5.0–8.0)

## 2020-02-06 LAB — CBC WITH DIFFERENTIAL/PLATELET
Abs Immature Granulocytes: 0.01 10*3/uL (ref 0.00–0.07)
Basophils Absolute: 0.1 10*3/uL (ref 0.0–0.1)
Basophils Relative: 1 %
Eosinophils Absolute: 0.4 10*3/uL (ref 0.0–0.5)
Eosinophils Relative: 3 %
HCT: 37.4 % (ref 36.0–46.0)
Hemoglobin: 12.3 g/dL (ref 12.0–15.0)
Immature Granulocytes: 0 %
Lymphocytes Relative: 36 %
Lymphs Abs: 3.7 10*3/uL (ref 0.7–4.0)
MCH: 26.3 pg (ref 26.0–34.0)
MCHC: 32.9 g/dL (ref 30.0–36.0)
MCV: 79.9 fL — ABNORMAL LOW (ref 80.0–100.0)
Monocytes Absolute: 0.8 10*3/uL (ref 0.1–1.0)
Monocytes Relative: 8 %
Neutro Abs: 5.3 10*3/uL (ref 1.7–7.7)
Neutrophils Relative %: 52 %
Platelets: 334 10*3/uL (ref 150–400)
RBC: 4.68 MIL/uL (ref 3.87–5.11)
RDW: 15 % (ref 11.5–15.5)
WBC: 10.2 10*3/uL (ref 4.0–10.5)
nRBC: 0 % (ref 0.0–0.2)

## 2020-02-06 LAB — COMPREHENSIVE METABOLIC PANEL
ALT: 15 U/L (ref 0–44)
AST: 17 U/L (ref 15–41)
Albumin: 3.9 g/dL (ref 3.5–5.0)
Alkaline Phosphatase: 91 U/L (ref 38–126)
Anion gap: 9 (ref 5–15)
BUN: 7 mg/dL (ref 6–20)
CO2: 24 mmol/L (ref 22–32)
Calcium: 9.4 mg/dL (ref 8.9–10.3)
Chloride: 107 mmol/L (ref 98–111)
Creatinine, Ser: 1.06 mg/dL — ABNORMAL HIGH (ref 0.44–1.00)
GFR calc Af Amer: >60 mL/min (ref >60)
GFR calc non Af Amer: >60 mL/min (ref >60)
Glucose, Bld: 75 mg/dL (ref 70–99)
Potassium: 3.7 mmol/L (ref 3.5–5.1)
Sodium: 140 mmol/L (ref 135–145)
Total Bilirubin: 0.5 mg/dL (ref 0.3–1.2)
Total Protein: 6.7 g/dL (ref 6.5–8.1)

## 2020-02-06 LAB — I-STAT BETA HCG BLOOD, ED (MC, WL, AP ONLY): I-stat hCG, quantitative: <5 m[IU]/mL (ref <5)

## 2020-02-06 LAB — CBG MONITORING, ED: Glucose-Capillary: 122 mg/dL — ABNORMAL HIGH (ref 70–99)

## 2020-02-06 LAB — BRAIN NATRIURETIC PEPTIDE: B Natriuretic Peptide: 20.6 pg/mL (ref 0.0–100.0)

## 2020-02-06 MED ORDER — SODIUM CHLORIDE 0.9 % IV BOLUS
500.0000 mL | Freq: Once | INTRAVENOUS | Status: AC
  Administered 2020-02-06: 500 mL via INTRAVENOUS

## 2020-02-06 MED ORDER — METOCLOPRAMIDE HCL 5 MG/ML IJ SOLN
10.0000 mg | Freq: Once | INTRAMUSCULAR | Status: AC
  Administered 2020-02-06: 10 mg via INTRAVENOUS
  Filled 2020-02-06: qty 2

## 2020-02-06 MED ORDER — KETOROLAC TROMETHAMINE 15 MG/ML IJ SOLN
15.0000 mg | Freq: Once | INTRAMUSCULAR | Status: AC
  Administered 2020-02-06: 15 mg via INTRAVENOUS
  Filled 2020-02-06: qty 1

## 2020-02-06 NOTE — ED Triage Notes (Addendum)
Patient arrives via POV with concerns about  post-postpartem eclampsia and diabetes. Per the patient, the nurses in the doctors office referred her to the ER a few days ago, and she arrived today for evaluation. Patient also reports intermittent central chest pain since January.   Patient recently gave birth on 11/16 (vaginal).

## 2020-02-06 NOTE — Discharge Instructions (Addendum)
We saw you in the ER for chest pain. All the results in the ER are normal, labs and imaging. We are not sure what is causing your symptoms.  There is no evidence of preeclampsia heart failure or heart attack. The workup in the ER is not complete, and is limited to screening for life threatening and emergent conditions only, so please see a primary care doctor for further evaluation.    Return to the ER immediately if you start having severe chest pain, shortness of breath, near fainting spell. Otherwise see your primary care doctor.

## 2020-02-07 NOTE — ED Provider Notes (Signed)
[facility] EMERGENCY DEPARTMENT Provider Note   CSN: [id number] Arrival date & time: 02/06/20  1940     History No chief complaint on file.   Andrea Archer is a 19 y.o. female.  HPI    19 year old female comes in a chief complaint of elevated blood pressure.  Patient is about 3 months postpartum.  She has history of sickle cell trait and reports that there were no complications during her pregnancy.  Soon after her pregnancy however she has been having some chest discomfort.  She describes the chest discomfort as tightness, which is not pleuritic in nature.  She has been trying to get an appointment with her OB and PCP but they have not been able to secure an appointment for her and she has been advised to come to the ER for further evaluation and to rule out preeclampsia.  Pt has no hx of PE, DVT and denies any exogenous hormone (testosterone / estrogen) use, long distance travels or surgery in the past 6 weeks, active cancer, recent immobilization.   Past Medical History:  Diagnosis Date  . Anxiety   . Depression   . Pregnant   . Sickle cell trait [facility])     Patient Active Problem List   Diagnosis Date Noted  . History of gestational hypertension 12/21/2019  . Lactating mother 12/21/2019  . Sickle cell trait ([place]) 05/04/2019  . Change in bowel habits 06/18/2018  . Left lower quadrant pain 06/17/2018    Past Surgical History:  Procedure Laterality Date  . FINGER SURGERY Left    Pinky Finger  . FRACTURE SURGERY  01/09/2019   left pinky finger     OB History    Gravida  1   Para  1   Term  1   Preterm  0   AB  0   Living  1     SAB  0   TAB  0   Ectopic  0   Multiple  0   Live Births  1           Family History  Problem Relation Age of Onset  . Diabetes Maternal Grandmother   . Hypertension Maternal Grandmother   . Heart disease Maternal Grandmother   . Fibromyalgia Maternal Grandmother   . Pancreatic cancer Maternal  Grandfather   . Hypertension Mother   . Sickle cell anemia Father   . Colon cancer Neg Hx   . Stomach cancer Neg Hx     Social History   Tobacco Use  . Smoking status: Former Smoker    Types: Cigars  . Smokeless tobacco: Never Used  Substance Use Topics  . Alcohol use: No  . Drug use: No    Home Medications Prior to Admission medications   Medication Sig Start Date End Date Taking? Authorizing Provider  butalbital-acetaminophen-caffeine (FIORICET) 50-325-40 MG tablet Take 1-2 tablets by mouth every 6 (six) hours as needed for headache. 12/08/19 12/07/20  Shelly Bombard, MD  diphenhydrAMINE (BENADRYL) 25 mg capsule Take 25 mg by mouth every 6 (six) hours as needed for allergies.    [provider]  Elastic Bandages & Supports (WRIST BRACE/RIGHT SMALL) MISC 1 Device by Does not apply route daily. 12/21/19   Leftwich-Kirby, Kathie Dike, CNM  ibuprofen (ADVIL) 800 MG tablet Take 1 tablet (800 mg total) by mouth every 8 (eight) hours as needed. 11/17/19   Tresea Mall, CNM  Prenatal MV-Min-Fe Fum-FA-DHA (PRENATAL 1 PO) Take 1 tablet by mouth  daily.     [provider]  Prenatal Vit-Fe Phos-FA-Omega (VITAFOL GUMMIES) 3.33-0.333-34.8 MG CHEW Chew 3 tablets by mouth daily before breakfast. 12/08/19   Shelly Bombard, MD    Allergies    Mushroom extract complex and Pineapple  Review of Systems   Review of Systems  Constitutional: Positive for activity change.  Respiratory: Negative for shortness of breath.   Cardiovascular: Positive for chest pain.  Gastrointestinal: Negative for nausea and vomiting.  Allergic/Immunologic: Negative for immunocompromised state.  Hematological: Does not bruise/bleed easily.  All other systems reviewed and are negative.   Physical Exam Updated Vital Signs BP (!) 131/96 (BP Location: Right Arm)   Pulse (!) 107   Resp 15   Ht 5\' 7"  (1.702 m)   Wt 76.2 kg   LMP 01/27/2020   SpO2 100%   Breastfeeding Yes   BMI 26.31 kg/m    Physical Exam Vitals and nursing note reviewed.  Constitutional:      Appearance: She is well-developed.  HENT:     Head: Normocephalic and atraumatic.  Cardiovascular:     Rate and Rhythm: Normal rate.  Pulmonary:     Effort: Pulmonary effort is normal.  Abdominal:     General: Bowel sounds are normal.  Musculoskeletal:     Cervical back: Normal range of motion and neck supple.  Skin:    General: Skin is warm and dry.  Neurological:     Mental Status: She is alert and oriented to person, place, and time.     ED Results / Procedures / Treatments   Labs (all labs ordered are listed, but only abnormal results are displayed) Labs Reviewed  URINALYSIS, ROUTINE W REFLEX MICROSCOPIC - Abnormal; Notable for the following components:      Result Value   Leukocytes,Ua TRACE (*)    All other components within normal limits  COMPREHENSIVE METABOLIC PANEL - Abnormal; Notable for the following components:   Creatinine, Ser 1.06 (*)    All other components within normal limits  CBC WITH DIFFERENTIAL/PLATELET - Abnormal; Notable for the following components:   MCV 79.9 (*)    All other components within normal limits  CBG MONITORING, ED - Abnormal; Notable for the following components:   Glucose-Capillary 122 (*)    All other components within normal limits  BRAIN NATRIURETIC PEPTIDE  I-STAT BETA HCG BLOOD, ED ([facility], [facility], [facility] ONLY)    EKG EKG Interpretation  Date/Time:  Saturday February 06 2020 19:48:15 EST Ventricular Rate:  95 PR Interval:  132 QRS Duration: 84 QT Interval:  340 QTC Calculation: 427 R Axis:   76 Text Interpretation: Normal sinus rhythm with sinus arrhythmia ST & T wave abnormality, consider inferior ischemia Abnormal ECG No acute changes twi in the lateral leads Confirmed by Varney Biles [phone]) on 02/06/2020 9:11:14 PM   Radiology DG Chest Port 1 View  Result Date: 02/06/2020 CLINICAL DATA:  Shortness of breath EXAM: PORTABLE CHEST 1 VIEW COMPARISON:   01/22/2018 FINDINGS: The heart size and mediastinal contours are within normal limits. Both lungs are clear. The visualized skeletal structures are unremarkable. IMPRESSION: No active disease. Electronically Signed   By: Rolm Baptise M.D.   On: 02/06/2020 22:21    Procedures Procedures (including critical care time)  Medications Ordered in ED Medications  sodium chloride 0.9 % bolus 500 mL (0 mLs Intravenous Stopped 02/06/20 2357)  ketorolac (TORADOL) 15 MG/ML injection 15 mg (15 mg Intravenous Given 02/06/20 2312)  metoCLOPramide (REGLAN) injection 10 mg (10 mg  Intravenous Given 02/06/20 2312)    ED Course  I have reviewed the triage vital signs and the nursing notes.  Pertinent labs & imaging results that were available during my care of the patient were reviewed by me and considered in my medical decision making (see chart for details).    MDM Rules/Calculators/A&P                      Patient comes into the ER with chief complaint of chest discomfort.  She also wants to rule out preeclampsia and diabetes, conditions that her PCP is concerned about.  She is 3 months postpartum, and her chest tightness has been present for last several weeks now.  Symptoms are not pleuritic.  She is PERC negative, and has well score of 0.  My suspicion for PE is extremely low.  I discussed this with the patient and she is comfortable with conservative management for now and not getting D-dimer.  If she starts having worsening chest pain, shortness of breath, dizziness, near fainting then she will return to the ER.  In the interim, UA is not showing any proteinuria.  Patient's blood pressure in the ER actually has been below 140 the entire time.  She informs me that her baseline blood pressure is in the 120s and 130s.  No further work-up is needed in the ED.   Final Clinical Impression(s) / ED Diagnoses Final diagnoses:  Nonspecific chest pain    Rx / DC Orders ED Discharge Orders    None         Varney Biles, MD 02/07/20 2324

## 2020-02-15 ENCOUNTER — Telehealth: Payer: Self-pay

## 2020-06-01 ENCOUNTER — Encounter (HOSPITAL_COMMUNITY): Payer: Self-pay

## 2020-06-01 ENCOUNTER — Other Ambulatory Visit: Payer: Self-pay

## 2020-06-01 ENCOUNTER — Ambulatory Visit (HOSPITAL_COMMUNITY)
Admission: EM | Admit: 2020-06-01 | Discharge: 2020-06-01 | Disposition: A | Discharge status: Home / Self Care | Payer: Medicaid Other | Attending: Family Medicine | Admitting: Family Medicine

## 2020-06-01 DIAGNOSIS — R1031 Right lower quadrant pain: Secondary | ICD-10-CM | POA: Diagnosis not present

## 2020-06-01 HISTORY — DX: Essential (primary) hypertension: I10

## 2020-06-01 LAB — CBC WITH DIFFERENTIAL/PLATELET
Abs Immature Granulocytes: 0.01 10*3/uL (ref 0.00–0.07)
Basophils Absolute: 0.1 10*3/uL (ref 0.0–0.1)
Basophils Relative: 1 %
Eosinophils Absolute: 0.4 10*3/uL (ref 0.0–0.5)
Eosinophils Relative: 5 %
HCT: 39.3 % (ref 36.0–46.0)
Hemoglobin: 13.1 g/dL (ref 12.0–15.0)
Immature Granulocytes: 0 %
Lymphocytes Relative: 34 %
Lymphs Abs: 2.5 10*3/uL (ref 0.7–4.0)
MCH: 27.2 pg (ref 26.0–34.0)
MCHC: 33.3 g/dL (ref 30.0–36.0)
MCV: 81.5 fL (ref 80.0–100.0)
Monocytes Absolute: 0.6 10*3/uL (ref 0.1–1.0)
Monocytes Relative: 8 %
Neutro Abs: 4 10*3/uL (ref 1.7–7.7)
Neutrophils Relative %: 52 %
Platelets: 306 10*3/uL (ref 150–400)
RBC: 4.82 MIL/uL (ref 3.87–5.11)
RDW: 14.3 % (ref 11.5–15.5)
WBC: 7.5 10*3/uL (ref 4.0–10.5)
nRBC: 0 % (ref 0.0–0.2)

## 2020-06-01 LAB — COMPREHENSIVE METABOLIC PANEL
ALT: 15 U/L (ref 0–44)
AST: 17 U/L (ref 15–41)
Albumin: 3.9 g/dL (ref 3.5–5.0)
Alkaline Phosphatase: 91 U/L (ref 38–126)
Anion gap: 10 (ref 5–15)
BUN: <5 mg/dL — ABNORMAL LOW (ref 6–20)
CO2: 25 mmol/L (ref 22–32)
Calcium: 9.5 mg/dL (ref 8.9–10.3)
Chloride: 104 mmol/L (ref 98–111)
Creatinine, Ser: 0.89 mg/dL (ref 0.44–1.00)
GFR calc Af Amer: >60 mL/min (ref >60)
GFR calc non Af Amer: >60 mL/min (ref >60)
Glucose, Bld: 82 mg/dL (ref 70–99)
Potassium: 4.5 mmol/L (ref 3.5–5.1)
Sodium: 139 mmol/L (ref 135–145)
Total Bilirubin: 1.1 mg/dL (ref 0.3–1.2)
Total Protein: 6.9 g/dL (ref 6.5–8.1)

## 2020-06-01 LAB — POCT URINALYSIS DIP (DEVICE)
Bilirubin Urine: NEGATIVE
Glucose, UA: NEGATIVE mg/dL
Hgb urine dipstick: NEGATIVE
Ketones, ur: NEGATIVE mg/dL
Nitrite: NEGATIVE
Protein, ur: NEGATIVE mg/dL
Specific Gravity, Urine: 1.025 (ref 1.005–1.030)
Urobilinogen, UA: 0.2 mg/dL (ref 0.0–1.0)
pH: 6 (ref 5.0–8.0)

## 2020-06-01 LAB — POC URINE PREG, ED: Preg Test, Ur: NEGATIVE

## 2020-06-01 NOTE — Discharge Instructions (Signed)

## 2020-06-01 NOTE — ED Triage Notes (Signed)
Pt c/o 8/10 RLQ abdominal painx1 mo. Pt also has nausea, but denies V/D. Pt last BM yesterday.

## 2020-06-01 NOTE — ED Provider Notes (Signed)
[facility]   [id number] 06/01/20 Arrival Time: [id number]  ASSESSMENT & PLAN:  1. Right lower quadrant abdominal pain     Overall with a benign abdominal exam. No indications for urgent abdominal/pelvic imaging at this time. Question etiology of her pain at this time. Discussed possible need for imaging in the near future. Awaiting labs. Will let her know of any significant results that may guide future evaluation. UPT negative. U/A with trace leukocytes; no urinary symptoms. Urine culture sent. Declines STI testing.    Discharge Instructions     You have been seen today for abdominal pain. Your evaluation was not suggestive of any emergent condition requiring medical intervention at this time. However, some abdominal problems make take more time to appear. Therefore, it is very important for you to pay attention to any new symptoms or worsening of your current condition.  Please return here or to the Emergency Department immediately should you begin to feel worse in any way or have any of the following symptoms: increasing or different abdominal pain, persistent vomiting, inability to drink fluids, fevers, or shaking chills.       Follow-up Information    Schedule an appointment as soon as possible for a visit  with [place].   Specialty: Family Medicine Contact information: [address] [id number] [place].   Specialty: Emergency Medicine Why: If symptoms worsen in any way. Contact information: [address] [id number] [place] [phone]           Reviewed expectations re: course of current medical issues. Questions answered. Outlined signs and symptoms indicating need for more acute intervention. Patient verbalized understanding. After Visit Summary given.   SUBJECTIVE: History  from: patient. Andrea Archer is a 19 y.o. female who presents with complaint of intermittent RLQ abdominal discomfort. Onset gradual, first noted approx one month ago. Discomfort described as dull; without radiation; does not wake her at night. Reports normal flatus but questions if this is gas pain. Symptoms are unchanged since beginning. Fever: absent. Aggravating factors: have not been identified. Pain not related to PO intake. Alleviating factors: have not been identified. Associated symptoms: none reported. She denies anorexia, arthralgias, belching, chills, constipation, headache, myalgias and sweats. Appetite: normal. PO intake: normal. Ambulatory without assistance. Urinary symptoms: none. Bowel movements: have not significantly changed. History of similar: no. OTC treatment: none reported.   Past Surgical History:  Procedure Laterality Date  . FINGER SURGERY Left    Pinky Finger  . FRACTURE SURGERY  01/09/2019   left pinky finger     OBJECTIVE:  Vitals:   06/01/20 1121 06/01/20 1122  BP: 109/75   Pulse: 74   Resp: 16   Temp: 98.8 F (37.1 C)   TempSrc: Oral   SpO2: 95%   Weight:  67.6 kg  Height:  5\' 7"  (1.702 m)    General appearance: alert, oriented, no acute distress HEENT: [place]; AT; oropharynx moist Lungs: unlabored respirations Abdomen: soft; without distention; mild  and poorly localized tenderness to palpation over R lower abdomen; normal bowel sounds; without masses or organomegaly; without guarding or rebound tenderness Back: without reported CVA tenderness; FROM at waist Extremities: without LE edema; symmetrical; without gross deformities Skin: warm and dry Neurologic: normal gait Psychological: alert and cooperative; normal mood and affect  Labs: Results for orders placed or performed during the hospital encounter of 06/01/20  POCT urinalysis dip (device)  Result Value Ref Range   Glucose, UA NEGATIVE NEGATIVE mg/dL   Bilirubin Urine NEGATIVE NEGATIVE    Ketones, ur NEGATIVE NEGATIVE mg/dL   Specific Gravity, Urine 1.025 1.005 - 1.030   Hgb urine dipstick NEGATIVE NEGATIVE   pH 6.0 5.0 - 8.0   Protein, ur NEGATIVE NEGATIVE mg/dL   Urobilinogen, UA 0.2 0.0 - 1.0 mg/dL   Nitrite NEGATIVE NEGATIVE   Leukocytes,Ua TRACE (A) NEGATIVE  POC urine preg, ED (not at [facility])  Result Value Ref Range   Preg Test, Ur NEGATIVE NEGATIVE   Labs Reviewed  POCT URINALYSIS DIP (DEVICE) - Abnormal; Notable for the following components:      Result Value   Leukocytes,Ua TRACE (*)    All other components within normal limits  CBC WITH DIFFERENTIAL/PLATELET  COMPREHENSIVE METABOLIC PANEL  POC URINE PREG, ED    Allergies  Allergen Reactions  . Mushroom Extract Complex Rash  . Pineapple Rash                                               Past Medical History:  Diagnosis Date  . Anxiety   . Depression   . Hypertension   . Pregnant   . Sickle cell trait ([place])     Social History   Socioeconomic History  . Marital status: Significant Other    Spouse name: Not on file  . Number of children: Not on file  . Years of education: Not on file  . Highest education level: Not on file  Occupational History  . Not on file  Tobacco Use  . Smoking status: Former Smoker    Types: Cigars  . Smokeless tobacco: Never Used  Substance and Sexual Activity  . Alcohol use: No  . Drug use: No  . Sexual activity: Yes    Partners: Male    Birth control/protection: Other-see comments    Comment: pregnant  Other Topics Concern  . Not on file  Social History Narrative  . Not on file   Social Determinants of Health   Financial Resource Strain: Low Risk   . Difficulty of Paying Living Expenses: Not hard at all  Food Insecurity: Unknown  . Worried About Charity fundraiser in the Last Year: Patient refused  . Ran Out of Food in the Last Year: Patient refused  Transportation Needs: Unknown  . Lack of Transportation (Medical): Patient refused  . Lack of  Transportation (Non-Medical): Patient refused  Physical Activity: Unknown  . Days of Exercise per Week: Patient refused  . Minutes of Exercise per Session: Patient refused  Stress:   . Feeling of Stress :   Social Connections:   . Frequency of Communication with Friends and Family:   . Frequency of Social Gatherings with Friends and Family:   . Attends Religious Services:   . Active Member of Clubs or Organizations:   . Attends Archivist Meetings:   Marland Kitchen Marital Status:   Intimate Partner Violence: Not At Risk  . Fear of Current or Ex-Partner: No  . Emotionally Abused: No  . Physically Abused: No  . Sexually Abused: No    Family History  Problem Relation Age of Onset  . Diabetes Maternal Grandmother   . Hypertension Maternal Grandmother   . Heart disease Maternal Grandmother   . Fibromyalgia Maternal Grandmother   .  Pancreatic cancer Maternal Grandfather   . Hypertension Mother   . Sickle cell anemia Father   . Colon cancer Neg Hx   . Stomach cancer Neg Hx      Vanessa Kick, MD 06/01/20 1301

## 2020-06-02 LAB — URINE CULTURE: Culture: <10000 — AB

## 2020-07-24 ENCOUNTER — Other Ambulatory Visit: Payer: Self-pay

## 2020-07-24 ENCOUNTER — Encounter (HOSPITAL_COMMUNITY): Payer: Self-pay | Admitting: Emergency Medicine

## 2020-07-24 ENCOUNTER — Emergency Department (HOSPITAL_COMMUNITY)
Admission: EM | Admit: 2020-07-24 | Discharge: 2020-07-25 | Disposition: A | Discharge status: Home / Self Care | Payer: Medicaid Other | Attending: Emergency Medicine | Admitting: Emergency Medicine

## 2020-07-24 DIAGNOSIS — R1011 Right upper quadrant pain: Secondary | ICD-10-CM

## 2020-07-24 DIAGNOSIS — I10 Essential (primary) hypertension: Secondary | ICD-10-CM | POA: Diagnosis not present

## 2020-07-24 DIAGNOSIS — Z87891 Personal history of nicotine dependence: Secondary | ICD-10-CM | POA: Diagnosis not present

## 2020-07-24 DIAGNOSIS — E739 Lactose intolerance, unspecified: Secondary | ICD-10-CM

## 2020-07-24 LAB — CBC
HCT: 38.1 % (ref 36.0–46.0)
Hemoglobin: 12.5 g/dL (ref 12.0–15.0)
MCH: 26.8 pg (ref 26.0–34.0)
MCHC: 32.8 g/dL (ref 30.0–36.0)
MCV: 81.6 fL (ref 80.0–100.0)
Platelets: 319 10*3/uL (ref 150–400)
RBC: 4.67 MIL/uL (ref 3.87–5.11)
RDW: 13.8 % (ref 11.5–15.5)
WBC: 8.3 10*3/uL (ref 4.0–10.5)
nRBC: 0 % (ref 0.0–0.2)

## 2020-07-24 LAB — COMPREHENSIVE METABOLIC PANEL
ALT: 11 U/L (ref 0–44)
AST: 14 U/L — ABNORMAL LOW (ref 15–41)
Albumin: 3.8 g/dL (ref 3.5–5.0)
Alkaline Phosphatase: 77 U/L (ref 38–126)
Anion gap: 8 (ref 5–15)
BUN: <5 mg/dL — ABNORMAL LOW (ref 6–20)
CO2: 24 mmol/L (ref 22–32)
Calcium: 9.4 mg/dL (ref 8.9–10.3)
Chloride: 107 mmol/L (ref 98–111)
Creatinine, Ser: 0.93 mg/dL (ref 0.44–1.00)
GFR calc Af Amer: >60 mL/min (ref >60)
GFR calc non Af Amer: >60 mL/min (ref >60)
Glucose, Bld: 77 mg/dL (ref 70–99)
Potassium: 3.9 mmol/L (ref 3.5–5.1)
Sodium: 139 mmol/L (ref 135–145)
Total Bilirubin: 1 mg/dL (ref 0.3–1.2)
Total Protein: 6.5 g/dL (ref 6.5–8.1)

## 2020-07-24 LAB — I-STAT BETA HCG BLOOD, ED (MC, WL, AP ONLY): I-stat hCG, quantitative: <5 m[IU]/mL (ref <5)

## 2020-07-24 LAB — URINALYSIS, ROUTINE W REFLEX MICROSCOPIC
Bilirubin Urine: NEGATIVE
Glucose, UA: NEGATIVE mg/dL
Hgb urine dipstick: NEGATIVE
Ketones, ur: NEGATIVE mg/dL
Leukocytes,Ua: NEGATIVE
Nitrite: NEGATIVE
Protein, ur: NEGATIVE mg/dL
Specific Gravity, Urine: 1.013 (ref 1.005–1.030)
pH: 5 (ref 5.0–8.0)

## 2020-07-24 LAB — LIPASE, BLOOD: Lipase: 28 U/L (ref 11–51)

## 2020-07-24 MED ORDER — SODIUM CHLORIDE 0.9% FLUSH: 3.0000 mL | Freq: Once | INTRAVENOUS | Status: DC

## 2020-07-24 NOTE — ED Triage Notes (Signed)
C/o R sided abd pain and nausea x 1 week.  Reports bilateral shoulder blade pain.

## 2020-07-25 MED ORDER — ALUM & MAG HYDROXIDE-SIMETH 200-200-20 MG/5ML PO SUSP
30.0000 mL | Freq: Once | ORAL | Status: AC
  Administered 2020-07-25: 30 mL via ORAL
  Filled 2020-07-25: qty 30

## 2020-07-25 MED ORDER — LIDOCAINE VISCOUS HCL 2 % MT SOLN
15.0000 mL | Freq: Once | OROMUCOSAL | Status: AC
  Administered 2020-07-25: 15 mL via ORAL
  Filled 2020-07-25: qty 15

## 2020-07-25 MED ORDER — HYOSCYAMINE SULFATE 0.125 MG SL SUBL
0.2500 mg | SUBLINGUAL_TABLET | Freq: Once | SUBLINGUAL | Status: AC
  Administered 2020-07-25: 0.25 mg via SUBLINGUAL
  Filled 2020-07-25: qty 2

## 2020-07-25 NOTE — ED Provider Notes (Signed)
[facility] EMERGENCY DEPARTMENT Provider Note   CSN: [id number] Arrival date & time: 07/24/20  1816    History Chief Complaint  Patient presents with  . Abdominal Pain   Andrea Archer is a 19 y.o. female.  Ms. Threat is 19yo female with hx of sickle cell trait, lactose intolerance who presents to ED with worsening RUQ pain. Patient says sharp, 9/10 pain started last Friday on her right side. She reports pain has woken her up from sleep over the last week but pain also improves when she lays flat.  Also states pain worsens when sitting for long periods of time or takes a deep breath. No association with meals, says appetite unchanged. Endorses nausea, no vomiting. Says she felt hot a few times over the last week but no fever. Of note, patient says she is lactose intolerant and had mac n cheese prior to visit tonight. Denies CP, dysuria, vaginal discharge, diarrhea, constipation.       Past Medical History:  Diagnosis Date  . Anxiety   . Depression   . Hypertension   . Pregnant   . Sickle cell trait [facility])    Patient Active Problem List   Diagnosis Date Noted  . History of gestational hypertension 12/21/2019  . Lactating mother 12/21/2019  . Sickle cell trait ([place]) 05/04/2019  . Change in bowel habits 06/18/2018  . Left lower quadrant pain 06/17/2018   Past Surgical History:  Procedure Laterality Date  . FINGER SURGERY Left    Pinky Finger  . FRACTURE SURGERY  01/09/2019   left pinky finger     OB History    Gravida  1   Para  1   Term  1   Preterm  0   AB  0   Living  1     SAB  0   TAB  0   Ectopic  0   Multiple  0   Live Births  1          Family History  Problem Relation Age of Onset  . Diabetes Maternal Grandmother   . Hypertension Maternal Grandmother   . Heart disease Maternal Grandmother   . Fibromyalgia Maternal Grandmother   . Pancreatic cancer Maternal Grandfather   . Hypertension Mother   . Sickle cell anemia  Father   . Colon cancer Neg Hx   . Stomach cancer Neg Hx     Social History   Tobacco Use  . Smoking status: Former Smoker    Types: Cigars  . Smokeless tobacco: Never Used  Vaping Use  . Vaping Use: Never used  Substance Use Topics  . Alcohol use: No  . Drug use: No   Home Medications Prior to Admission medications   Medication Sig Start Date End Date Taking? Authorizing Provider  Elastic Bandages & Supports (WRIST BRACE/RIGHT SMALL) MISC 1 Device by Does not apply route daily. 12/21/19   Leftwich-Kirby, Kathie Dike, CNM   Allergies    Mushroom extract complex and Pineapple  Review of Systems   Review of Systems  Physical Exam Updated Vital Signs BP 119/65 (BP Location: Right Arm)   Pulse 63   Temp 98.6 F (37 C) (Oral)   Resp 18   Ht _0  (1.702 m)   Wt 63.5 kg   LMP 07/14/2020   SpO2 100%   BMI 21.93 kg/m   Physical Exam Constitutional:      General: She is awake. She is not in acute distress. HENT:  Head: Normocephalic and atraumatic.  Eyes:     General: Lids are normal.     Conjunctiva/sclera: Conjunctivae normal.  Cardiovascular:     Rate and Rhythm: Normal rate and regular rhythm.     Pulses: Normal pulses.     Heart sounds: Normal heart sounds.  Pulmonary:     Effort: Pulmonary effort is normal.     Breath sounds: Normal breath sounds.  Abdominal:     General: Bowel sounds are normal. There is no distension.     Palpations: Abdomen is soft.     Comments: Mild tenderness in RUQ and epigastric region. No guarding, rebound tenderness.  Musculoskeletal:     Right lower leg: No edema.     Left lower leg: No edema.  Skin:    General: Skin is warm and dry.     Capillary Refill: Capillary refill takes less than 2 seconds.  Neurological:     Mental Status: She is alert and oriented to person, place, and time.  Psychiatric:        Speech: Speech normal.        Behavior: Behavior is cooperative.        Cognition and Memory: Cognition normal.     ED Results / Procedures / Treatments   Labs (all labs ordered are listed, but only abnormal results are displayed) Labs Reviewed  COMPREHENSIVE METABOLIC PANEL - Abnormal; Notable for the following components:      Result Value   BUN <5 (*)    AST 14 (*)    All other components within normal limits  URINALYSIS, ROUTINE W REFLEX MICROSCOPIC - Abnormal; Notable for the following components:   APPearance HAZY (*)    All other components within normal limits  LIPASE, BLOOD  CBC  I-STAT BETA HCG BLOOD, ED ([facility], [facility], [facility] ONLY)    EKG None  Radiology No results found.  Procedures Procedures (including critical care time)  Medications Ordered in ED Medications  sodium chloride flush (NS) 0.9 % injection 3 mL (has no administration in time range)  hyoscyamine (LEVSIN SL) SL tablet 0.25 mg (has no administration in time range)  alum & mag hydroxide-simeth (MAALOX/MYLANTA) 200-200-20 MG/5ML suspension 30 mL (has no administration in time range)    And  lidocaine (XYLOCAINE) 2 % viscous mouth solution 15 mL (has no administration in time range)   ED Course  I have reviewed the triage vital signs and the nursing notes.  Pertinent labs & imaging results that were available during my care of the patient were reviewed by me and considered in my medical decision making (see chart for details).   MDM Rules/Calculators/A&P  Patient is 19yo female with SCT who presents to ED with 1wk hx of RUQ pain. On exam, afebrile, mild tenderness in RUQ. UA clean, no leukocytosis, LFT's & lipase nml. Bedside RUQ u/s showed no fluid, gallbladder wall inflammation, or stones. Pain likely d/t dietary non-compliance in setting of lactose intolerance. Giving pt GI cocktail for hopeful relief of sxs. Patient is stable, able to tolerate po, and ready for d/c.                         Final Clinical Impression(s) / ED Diagnoses Final diagnoses:  Lactose intolerance  Right upper quadrant abdominal pain   Rx /  DC Orders ED Discharge Orders    None       Sanjuan Dame, MD 07/27/20 1512    Fatima Blank, MD 08/06/20  0833  

## 2020-07-25 NOTE — ED Provider Notes (Signed)
Attestation:  I have personally seen and examined the patient. I have reviewed the documentation on PMH/FH/Soc Hx. I have discussed the plan of care with the resident and patient.  I have reviewed and agree with the resident's documentation. Please see associated encounter note.  Briefly, the patient is a 19 y.o. female here with 1 week of gradually worsening upper abdominal discomfort after eating mac & cheese.  Admits to being lactose intolerant.  Vitals:   07/24/20 2029 07/25/20 0259  BP: (!) 117/88 119/65  Pulse: 61 63  Resp: 20 18  Temp:  98.6 F (37 C)  SpO2: 100% 100%    CONSTITUTIONAL: Well-appearing, NAD NEURO:  Alert and oriented x 3, no focal deficits EYES:  pupils equal and reactive ENT/NECK:  trachea midline, no JVD CARDIO: Regular rate, regular rhythm, well-perfused PULM: None labored breathing GI/GU:  Abdomen non-distended, mild right upper quadrant abdominal tenderness without evidence of peritonitis MSK/SPINE:  No gross deformities, no edema SKIN:  no rash, atraumatic PSYCH:  Appropriate speech and behavior   EKG Interpretation  Date/Time:    Ventricular Rate:    PR Interval:    QRS Duration:   QT Interval:    QTC Calculation:   R Axis:     Text Interpretation:        Ultrasound ED Abd  Date/Time: 07/25/2020 8:00 AM Performed by: Fatima Blank, MD Authorized by: Fatima Blank, MD   Procedure details:    Indications: abdominal pain     Assessment for:  Gallstones   Hepatobiliary:  Visualized   Images: archived    Hepatobiliary findings:    Common bile duct:  Normal   Gallbladder wall:  Normal   Gallbladder stones: not identified     Intra-abdominal fluid: not identified     Sonographic Murphy's sign: negative      Work up grossly reassuring without leukocytosis or anemia.  No significant electrolyte derangements or renal insufficiency.  No evidence of biliary obstruction or pancreatitis.  Beta-hCG negative.  Bedside  ultrasound without cholecystolithiasis no evidence of acute cholecystitis.      Fatima Blank, MD 07/25/20 [phone]

## 2020-07-25 NOTE — Discharge Instructions (Signed)
-  Make sure to follow a diet that will decrease symptoms. See attached instructions for more information and specific tips for your diet. -Follow-up with your doctor at your next appointment.

## 2020-08-01 ENCOUNTER — Ambulatory Visit (INDEPENDENT_AMBULATORY_CARE_PROVIDER_SITE_OTHER): Payer: Medicaid Other | Admitting: Licensed Clinical Social Worker

## 2020-08-01 DIAGNOSIS — Z658 Other specified problems related to psychosocial circumstances: Secondary | ICD-10-CM

## 2020-08-01 NOTE — BH Specialist Note (Signed)
Integrated Behavioral Health via Telemedicine Video (Caregility) Visit  08/01/2020 Andrea Archer [id number]  Number of Integrated Behavioral Health visits: 1 Session Start time: 2:10pm  Session End time: 2:26pm Total time: 16 mins via mychart   Referring Provider: CMA L Lash Type of Visit: Video Patient/Family location: School  [facility] Provider location: [facility]  All persons participating in visit: Pt [name] and LCSWA A. Derrell Milanes   Confirmed patient's address: no  Confirmed patient's phone number: yes  Any changes to demographics: yes   Confirmed patient's insurance: yes  Any changes to patient's insurance: no   Discussed confidentiality: yes   I connected with Andrea Archer and/or [employer] n/a by a video enabled telemedicine application ([place]) and verified that I am speaking with the correct person using two identifiers.     I discussed the limitations of evaluation and management by telemedicine and the availability of in person appointments.  I discussed that the purpose of this visit is to provide behavioral health care while limiting exposure to the novel coronavirus.   Discussed there is a possibility of technology failure and discussed alternative modes of communication if that failure occurs.  I discussed that engaging in this virtual visit, they consent to the provision of behavioral healthcare and the services will be billed under their insurance.  Patient and/or legal guardian expressed understanding and consented to virtual visit: yes   PRESENTING CONCERNS: Patient and/or family reports the following symptoms/concerns: psychostress  Duration of problem: approx 2 weeks ; Severity of problem: none   Ms. Fontanella scheduled an appt to request a letter for school. 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 LCSWA A. Linton Rump advised Ms. Fralix there is no treatment record or history stating [facility] [place] locations have seen or treated  her for postpartum depression. Ms. Dempsey was instructed to contact PCP for the request school note.

## 2020-08-08 ENCOUNTER — Ambulatory Visit: Payer: Medicaid Other | Admitting: Advanced Practice Midwife

## 2020-12-05 ENCOUNTER — Encounter (HOSPITAL_COMMUNITY): Payer: Self-pay | Admitting: *Deleted

## 2020-12-05 ENCOUNTER — Other Ambulatory Visit: Payer: Self-pay

## 2020-12-05 ENCOUNTER — Ambulatory Visit (HOSPITAL_COMMUNITY)
Admission: EM | Admit: 2020-12-05 | Discharge: 2020-12-05 | Disposition: A | Discharge status: Home / Self Care | Payer: Medicaid Other | Attending: Internal Medicine | Admitting: Internal Medicine

## 2020-12-05 DIAGNOSIS — H01119 Allergic dermatitis of unspecified eye, unspecified eyelid: Secondary | ICD-10-CM | POA: Diagnosis not present

## 2020-12-05 MED ORDER — CLOTRIMAZOLE-BETAMETHASONE 1-0.05 % EX CREA: TOPICAL_CREAM | Freq: Two times a day (BID) | CUTANEOUS | 0 refills | Status: AC

## 2020-12-05 NOTE — ED Triage Notes (Signed)
Pt reports upper eye lid swelling after having her eye brows dyed last week. Pt reports her vision is more blurry than usual .

## 2020-12-05 NOTE — ED Notes (Signed)
Pt. States had eyebrows done with henna 4 days ago.  Noticed eyes swelling a little more each day.  Pt pulled to triage and vitals obtained.  Vitals WNL.  Pt in NAD at this time 100% on room air.  No swelling of tongue, no SOB, speaking in complete sentences.

## 2020-12-08 NOTE — ED Provider Notes (Signed)
[facility]    CSN: [id number] Arrival date & time: 12/05/20  1733      History   Chief Complaint Chief Complaint  Patient presents with  . Allergic Reaction    HPI Andrea Archer is a 19 y.o. female comes to the urgent care with itchy swelling over both eyebrows.  Patient patient applied henna dye to her eyebrows last week.  Following that patient started experiencing some itching and rash over the eyebrows.  She further experienced some swelling of both upper eyelids.  No visual changes.  Patient started applying hydrocortisone ointment to her eyebrow this morning  HPI  Past Medical History:  Diagnosis Date  . Anxiety   . Depression   . Hypertension   . Pregnant   . Sickle cell trait [facility])     Patient Active Problem List   Diagnosis Date Noted  . History of gestational hypertension 12/21/2019  . Lactating mother 12/21/2019  . Sickle cell trait ([place]) 05/04/2019  . Change in bowel habits 06/18/2018  . Left lower quadrant pain 06/17/2018    Past Surgical History:  Procedure Laterality Date  . FINGER SURGERY Left    Pinky Finger  . FRACTURE SURGERY  01/09/2019   left pinky finger    OB History    Gravida  1   Para  1   Term  1   Preterm  0   AB  0   Living  1     SAB  0   IAB  0   Ectopic  0   Multiple  0   Live Births  1            Home Medications    Prior to Admission medications   Medication Sig Start Date End Date Taking? Authorizing Provider  clotrimazole-betamethasone (LOTRISONE) cream Apply topically 2 (two) times daily for 3 days. Apply to affected area 2 times daily for 3 days 12/05/20 12/08/20  Chase Picket, MD  Elastic Bandages & Supports (WRIST BRACE/RIGHT SMALL) MISC 1 Device by Does not apply route daily. 12/21/19   Leftwich-Kirby, Kathie Dike, CNM    Family History Family History  Problem Relation Age of Onset  . Diabetes Maternal Grandmother   . Hypertension Maternal Grandmother   . Heart disease Maternal  Grandmother   . Fibromyalgia Maternal Grandmother   . Pancreatic cancer Maternal Grandfather   . Hypertension Mother   . Sickle cell anemia Father   . Colon cancer Neg Hx   . Stomach cancer Neg Hx     Social History Social History   Tobacco Use  . Smoking status: Former Smoker    Types: Cigars  . Smokeless tobacco: Never Used  Vaping Use  . Vaping Use: Never used  Substance Use Topics  . Alcohol use: No  . Drug use: No     Allergies   Mushroom extract complex and Pineapple   Review of Systems Review of Systems  Eyes: Negative for photophobia, redness and visual disturbance.  Respiratory: Negative.   Skin: Positive for rash. Negative for color change and wound.     Physical Exam Triage Vital Signs ED Triage Vitals  Enc Vitals Group     BP 12/05/20 1739 125/77     Pulse Rate 12/05/20 1739 77     Resp 12/05/20 1739 18     Temp 12/05/20 1739 98.1 F (36.7 C)     Temp Source 12/05/20 1739 Oral     SpO2 12/05/20 1739 100 %  Weight 12/05/20 1848 140 lb (63.5 kg)     Height 12/05/20 1848 5\' 7"  (1.702 m)     Head Circumference --      Peak Flow --      Pain Score 12/05/20 1847 9     Pain Loc --      Pain Edu? --      Excl. in [place]? --    No data found.  Updated Vital Signs BP 118/78 (BP Location: Right Arm)   Pulse 79   Temp 98.3 F (36.8 C) (Oral)   Resp 16   Ht 5\' 7"  (1.702 m)   Wt 63.5 kg   LMP 11/29/2020   SpO2 100%   BMI 21.93 kg/m   Visual Acuity Right Eye Distance:   Left Eye Distance:   Bilateral Distance:    Right Eye Near:   Left Eye Near:    Bilateral Near:     Physical Exam Vitals and nursing note reviewed.  Constitutional:      Appearance: Normal appearance.  HENT:     Mouth/Throat:     Comments: Rash over the upper eyelids with swelling.  No erythema. Eyes:     Comments: Mild swelling of upper eyelids bilaterally.  Rash noted on the eyelashes bilaterally.  No erythema or discharge.  Cardiovascular:     Rate and Rhythm:  Normal rate and regular rhythm.  Neurological:     Mental Status: She is alert.      UC Treatments / Results  Labs (all labs ordered are listed, but only abnormal results are displayed) Labs Reviewed - No data to display  EKG   Radiology No results found.  Procedures Procedures (including critical care time)  Medications Ordered in UC Medications - No data to display  Initial Impression / Assessment and Plan / UC Course  I have reviewed the triage vital signs and the nursing notes.  Pertinent labs & imaging results that were available during my care of the patient were reviewed by me and considered in my medical decision making (see chart for details).    1.  Allergic contact dermatitis: Patient is advised to avoid using henna dyes I will change steroid ointment to the higher potency steroid ointment. Lotrisone cream has been prescribed If patient notices worsening swelling, redness and drainage she is advised to return to the urgent care to be reevaluated. Final Clinical Impressions(s) / UC Diagnoses   Final diagnoses:  Contact and allergic dermatitis of eyelid   Discharge Instructions   None    ED Prescriptions    Medication Sig Dispense Auth. Provider   clotrimazole-betamethasone (LOTRISONE) cream Apply topically 2 (two) times daily for 3 days. Apply to affected area 2 times daily for 3 days 15 g Kimberly Coye, Myrene Galas, MD     PDMP not reviewed this encounter.   Chase Picket, MD 12/08/20 1343

## 2020-12-29 ENCOUNTER — Other Ambulatory Visit: Payer: Medicaid Other

## 2020-12-29 DIAGNOSIS — Z20822 Contact with and (suspected) exposure to covid-19: Secondary | ICD-10-CM

## 2020-12-31 LAB — NOVEL CORONAVIRUS, NAA: SARS-CoV-2, NAA: NOT DETECTED

## 2020-12-31 LAB — SARS-COV-2, NAA 2 DAY TAT

## 2021-03-28 ENCOUNTER — Encounter: Payer: Self-pay | Admitting: Obstetrics

## 2021-03-28 ENCOUNTER — Other Ambulatory Visit (HOSPITAL_COMMUNITY)
Admission: RE | Admit: 2021-03-28 | Discharge: 2021-03-28 | Disposition: A | Discharge status: Home / Self Care | Payer: Medicaid Other | Source: Ambulatory Visit | Attending: Obstetrics | Admitting: Obstetrics

## 2021-03-28 ENCOUNTER — Ambulatory Visit (INDEPENDENT_AMBULATORY_CARE_PROVIDER_SITE_OTHER): Payer: Medicaid Other | Admitting: Obstetrics

## 2021-03-28 ENCOUNTER — Other Ambulatory Visit: Payer: Self-pay

## 2021-03-28 VITALS — BP 122/79 | HR 96 | Ht 67.0 in | Wt 143.0 lb

## 2021-03-28 DIAGNOSIS — N9412 Deep dyspareunia: Secondary | ICD-10-CM

## 2021-03-28 DIAGNOSIS — Z3042 Encounter for surveillance of injectable contraceptive: Secondary | ICD-10-CM

## 2021-03-28 DIAGNOSIS — Z0000 Encounter for general adult medical examination without abnormal findings: Secondary | ICD-10-CM

## 2021-03-28 DIAGNOSIS — N898 Other specified noninflammatory disorders of vagina: Secondary | ICD-10-CM | POA: Diagnosis not present

## 2021-03-28 DIAGNOSIS — N939 Abnormal uterine and vaginal bleeding, unspecified: Secondary | ICD-10-CM | POA: Insufficient documentation

## 2021-03-28 DIAGNOSIS — Z113 Encounter for screening for infections with a predominantly sexual mode of transmission: Secondary | ICD-10-CM

## 2021-03-28 DIAGNOSIS — R102 Pelvic and perineal pain: Secondary | ICD-10-CM

## 2021-03-28 DIAGNOSIS — N643 Galactorrhea not associated with childbirth: Secondary | ICD-10-CM

## 2021-03-28 NOTE — Progress Notes (Signed)
Subjective:    Andrea Archer is a 20 y.o. female who presents for contraception counseling. The patient has no complaints today. The patient is sexually active. Pertinent past medical history: hypertension.  The information documented in the HPI was reviewed and verified.  Menstrual History: OB History    Gravida  1   Para  1   Term  1   Preterm  0   AB  0   Living  1     SAB  0   IAB  0   Ectopic  0   Multiple  0   Live Births  1            Patient's last menstrual period was 02/15/2021 (approximate).   Patient Active Problem List   Diagnosis Date Noted  . History of gestational hypertension 12/21/2019  . Lactating mother 12/21/2019  . Sickle cell trait ([place]) 05/04/2019  . Change in bowel habits 06/18/2018  . Left lower quadrant pain 06/17/2018   Past Medical History:  Diagnosis Date  . Anxiety   . Depression   . Hypertension   . Pregnant   . Sickle cell trait [facility])     Past Surgical History:  Procedure Laterality Date  . FINGER SURGERY Left    Pinky Finger  . FRACTURE SURGERY  01/09/2019   left pinky finger     Current Outpatient Medications:  .  Elastic Bandages & Supports (WRIST BRACE/RIGHT SMALL) MISC, 1 Device by Does not apply route daily. (Patient not taking: Reported on 03/28/2021), Disp: 1 each, Rfl: 0 Allergies  Allergen Reactions  . Mushroom Extract Complex Rash  . Pineapple Rash    Social History   Tobacco Use  . Smoking status: Former Smoker    Types: Cigars  . Smokeless tobacco: Never Used  Substance Use Topics  . Alcohol use: No    Family History  Problem Relation Age of Onset  . Diabetes Maternal Grandmother   . Hypertension Maternal Grandmother   . Heart disease Maternal Grandmother   . Fibromyalgia Maternal Grandmother   . Pancreatic cancer Maternal Grandfather   . Hypertension Mother   . Sickle cell anemia Father   . Colon cancer Neg Hx   . Stomach cancer Neg Hx        Review of Systems Constitutional:  negative for weight loss Genitourinary: positive for abnormal menstrual periods and vaginal discharge   Objective:   BP 122/79   Pulse 96   Ht 5\' 7"  (1.702 m)   Wt 143 lb (64.9 kg)   LMP 02/15/2021 (Approximate)   BMI 22.40 kg/m    General:   alert and no distress  Skin:   no rash or abnormalities  Lungs:   clear to auscultation bilaterally  Heart:   regular rate and rhythm, S1, S2 normal, no murmur, click, rub or gallop  Breasts:   normal without suspicious masses, skin or nipple changes or axillary nodes  Abdomen:  normal findings: no organomegaly, soft, non-tender and no hernia  Pelvis:  External genitalia: normal general appearance Urinary system: urethral meatus normal and bladder without fullness nontender Vaginal: normal without tenderness, induration or masses Cervix: normal appearance Adnexa: left adnexal tenderness, no masses or fullness Uterus: anteverted and non-tender, normal size   Lab Review Urine pregnancy test Labs reviewed yes Radiologic studies reviewed no  I have spent a total of 20 minutes of face-to-face time, excluding clinical staff time, reviewing notes and preparing to see patient, ordering tests and/or medications, and counseling  the patient.  Assessment:    20 y.o., discontinuing Depo-Provera injections, contraindications AUB.   Plan:    All questions answered. Blood tests: CBC with diff, Comprehensive metabolic panel, Prolactin hormone level, TSH and Ferritin. Chlamydia specimen. Contraception: considering OCP's. Diagnosis explained in detail, including differential. Discussed healthy lifestyle modifications. Follow up in 3 weeks. GC specimen. Pelvic ultrasound. Wet prep.  STD Panel  Orders Placed This Encounter  Procedures  . US PELVIC COMPLETE WITH TRANSVAGINAL    Standing Status:   Future    Standing Expiration Date:   03/28/2022    Order Specific Question:   Reason for Exam (SYMPTOM  OR DIAGNOSIS REQUIRED)    Answer:   AUB.   Pelvic pain    Order Specific Question:   Preferred imaging location?    Answer:   [facility]  . US PELVIC COMPLETE WITH TRANSVAGINAL    Standing Status:   Future    Standing Expiration Date:   03/28/2022    Order Specific Question:   Reason for Exam (SYMPTOM  OR DIAGNOSIS REQUIRED)    Answer:   AUB.  Pelvic pain.    Order Specific Question:   Preferred imaging location?    Answer:   [facility]  . TSH  . Comprehensive metabolic panel  . Hemoglobin A1c  . Ferritin  . Prolactin  . CBC  . HIV Antibody (routine testing w rflx)  . Hepatitis B surface antigen  . RPR  . Hepatitis C antibody  . Ambulatory referral to Internal Medicine    Referral Priority:   Routine    Referral Type:   Consultation    Referral Reason:   Specialty Services Required    Requested Specialty:   Internal Medicine    Number of Visits Requested:   1    Shelly Bombard, MD 03/28/2021 2:42 PM

## 2021-03-28 NOTE — Progress Notes (Signed)
GYN presents for AUB, c/o bleeding since the middle of February, abdominal pain 9/10, vaginal discharge, nausea, chills. Uses DEPO for BC, swollen lymph node behind right ear, headache.

## 2021-03-29 LAB — COMPREHENSIVE METABOLIC PANEL
ALT: 8 IU/L (ref 0–32)
AST: 17 IU/L (ref 0–40)
Albumin/Globulin Ratio: 1.7 (ref 1.2–2.2)
Albumin: 4.7 g/dL (ref 3.9–5.0)
Alkaline Phosphatase: 70 IU/L (ref 44–121)
BUN/Creatinine Ratio: 12 (ref 9–23)
BUN: 14 mg/dL (ref 6–20)
Bilirubin Total: 0.5 mg/dL (ref 0.0–1.2)
CO2: 21 mmol/L (ref 20–29)
Calcium: 9.6 mg/dL (ref 8.7–10.2)
Chloride: 108 mmol/L — ABNORMAL HIGH (ref 96–106)
Creatinine, Ser: 1.21 mg/dL — ABNORMAL HIGH (ref 0.57–1.00)
Globulin, Total: 2.7 g/dL (ref 1.5–4.5)
Glucose: 63 mg/dL — ABNORMAL LOW (ref 65–99)
Potassium: 4.5 mmol/L (ref 3.5–5.2)
Sodium: 144 mmol/L (ref 134–144)
Total Protein: 7.4 g/dL (ref 6.0–8.5)
eGFR: 65 mL/min/{1.73_m2} (ref >59)

## 2021-03-29 LAB — CERVICOVAGINAL ANCILLARY ONLY
Bacterial Vaginitis (gardnerella): NEGATIVE
Candida Glabrata: NEGATIVE
Candida Vaginitis: NEGATIVE
Chlamydia: NEGATIVE
Comment: NEGATIVE
Comment: NEGATIVE
Comment: NEGATIVE
Comment: NEGATIVE
Comment: NEGATIVE
Comment: NORMAL
Neisseria Gonorrhea: NEGATIVE
Trichomonas: NEGATIVE

## 2021-03-29 LAB — CBC
Hematocrit: 42.6 % (ref 34.0–46.6)
Hemoglobin: 13.9 g/dL (ref 11.1–15.9)
MCH: 27.9 pg (ref 26.6–33.0)
MCHC: 32.6 g/dL (ref 31.5–35.7)
MCV: 85 fL (ref 79–97)
Platelets: 330 10*3/uL (ref 150–450)
RBC: 4.99 x10E6/uL (ref 3.77–5.28)
RDW: 13.1 % (ref 11.7–15.4)
WBC: 8.4 10*3/uL (ref 3.4–10.8)

## 2021-03-29 LAB — HIV ANTIBODY (ROUTINE TESTING W REFLEX): HIV Screen 4th Generation wRfx: NONREACTIVE

## 2021-03-29 LAB — TSH: TSH: 2.49 u[IU]/mL (ref 0.450–4.500)

## 2021-03-29 LAB — HEPATITIS B SURFACE ANTIGEN: Hepatitis B Surface Ag: NEGATIVE

## 2021-03-29 LAB — RPR: RPR Ser Ql: NONREACTIVE

## 2021-03-29 LAB — HEPATITIS C ANTIBODY: Hep C Virus Ab: <0.1 s/co ratio (ref 0.0–0.9)

## 2021-03-29 LAB — HEMOGLOBIN A1C
Est. average glucose Bld gHb Est-mCnc: 103 mg/dL
Hgb A1c MFr Bld: 5.2 % (ref 4.8–5.6)

## 2021-03-29 LAB — PROLACTIN: Prolactin: 14.2 ng/mL (ref 4.8–23.3)

## 2021-03-29 LAB — FERRITIN: Ferritin: 26 ng/mL (ref 15–150)

## 2021-04-11 ENCOUNTER — Ambulatory Visit: Admission: RE | Admit: 2021-04-11 | Payer: Medicaid Other | Source: Ambulatory Visit

## 2021-04-19 ENCOUNTER — Ambulatory Visit: Admission: RE | Admit: 2021-04-19 | Payer: Medicaid Other | Source: Ambulatory Visit

## 2021-04-24 ENCOUNTER — Ambulatory Visit (HOSPITAL_COMMUNITY)
Admission: RE | Admit: 2021-04-24 | Discharge: 2021-04-24 | Disposition: A | Discharge status: Home / Self Care | Payer: Medicaid Other | Source: Ambulatory Visit | Attending: Obstetrics | Admitting: Obstetrics

## 2021-04-24 ENCOUNTER — Other Ambulatory Visit: Payer: Self-pay

## 2021-04-24 DIAGNOSIS — R102 Pelvic and perineal pain: Secondary | ICD-10-CM | POA: Insufficient documentation

## 2021-04-25 ENCOUNTER — Ambulatory Visit (INDEPENDENT_AMBULATORY_CARE_PROVIDER_SITE_OTHER): Payer: Medicaid Other | Admitting: Obstetrics

## 2021-04-25 ENCOUNTER — Other Ambulatory Visit (HOSPITAL_COMMUNITY)
Admission: RE | Admit: 2021-04-25 | Discharge: 2021-04-25 | Disposition: A | Discharge status: Home / Self Care | Payer: Medicaid Other | Source: Ambulatory Visit | Attending: Obstetrics | Admitting: Obstetrics

## 2021-04-25 ENCOUNTER — Encounter: Payer: Self-pay | Admitting: Obstetrics

## 2021-04-25 VITALS — BP 116/78 | HR 80 | Ht 67.0 in | Wt 140.0 lb

## 2021-04-25 DIAGNOSIS — Z3009 Encounter for other general counseling and advice on contraception: Secondary | ICD-10-CM

## 2021-04-25 DIAGNOSIS — Z30011 Encounter for initial prescription of contraceptive pills: Secondary | ICD-10-CM

## 2021-04-25 DIAGNOSIS — Z01419 Encounter for gynecological examination (general) (routine) without abnormal findings: Secondary | ICD-10-CM

## 2021-04-25 DIAGNOSIS — N9412 Deep dyspareunia: Secondary | ICD-10-CM | POA: Diagnosis not present

## 2021-04-25 DIAGNOSIS — K5909 Other constipation: Secondary | ICD-10-CM | POA: Diagnosis not present

## 2021-04-25 LAB — POCT URINE PREGNANCY: Preg Test, Ur: NEGATIVE

## 2021-04-25 MED ORDER — SLYND 4 MG PO TABS: 1.0000 | ORAL_TABLET | Freq: Every day | ORAL | 11 refills | Status: DC

## 2021-04-25 MED ORDER — DICYCLOMINE HCL 10 MG PO CAPS: 10.0000 mg | ORAL_CAPSULE | Freq: Three times a day (TID) | ORAL | 1 refills | Status: DC

## 2021-04-25 NOTE — Progress Notes (Signed)
Subjective:        Andrea Archer is a 20 y.o. female here for a routine exam.  Current complaints: Lower abdominal pain.  Chronic constipation..    Personal health questionnaire:  Is patient Andrea Archer, have a family history of breast and/or ovarian cancer: no Is there a family history of uterine cancer diagnosed at age < 27, gastrointestinal cancer, urinary tract cancer, family member who is a Field seismologist syndrome-associated carrier: no Is the patient overweight and hypertensive, family history of diabetes, personal history of gestational diabetes, preeclampsia or PCOS: no Is patient over 37, have PCOS,  family history of premature CHD under age 96, diabetes, smoke, have hypertension or peripheral artery disease:  no At any time, has a partner hit, kicked or otherwise hurt or frightened you?: no Over the past 2 weeks, have you felt down, depressed or hopeless?: no Over the past 2 weeks, have you felt little interest or pleasure in doing things?:no   Gynecologic History Patient's last menstrual period was 03/13/2021. Contraception: condoms Last Pap: none. Results were: none Last mammogram: n/a. Results were: n/a  Obstetric History OB History  Gravida Para Term Preterm AB Living  1 1 1  0 0 1  SAB IAB Ectopic Multiple Live Births  0 0 0 0 1    # Outcome Date GA Lbr Len/2nd Weight Sex Delivery Anes PTL Lv  1 Term 11/16/19 [redacted]w[redacted]d 09:34 / 02:57 7 lb 3.9 oz (3.286 kg) F Vag-Spont EPI  LIV    Past Medical History:  Diagnosis Date  . Anxiety   . Depression   . Hypertension   . Pregnant   . Sickle cell trait [facility])     Past Surgical History:  Procedure Laterality Date  . FINGER SURGERY Left    Pinky Finger  . FRACTURE SURGERY  01/09/2019   left pinky finger     Current Outpatient Medications:  .  dicyclomine (BENTYL) 10 MG capsule, Take 1 capsule (10 mg total) by mouth 4 (four) times daily -  before meals and at bedtime., Disp: 120 capsule, Rfl: 1 .  Drospirenone (SLYND) 4  MG TABS, Take 1 tablet by mouth daily., Disp: 28 tablet, Rfl: 11 .  Elastic Bandages & Supports (WRIST BRACE/RIGHT SMALL) MISC, 1 Device by Does not apply route daily. (Patient not taking: Reported on 03/28/2021), Disp: 1 each, Rfl: 0 Allergies  Allergen Reactions  . Mushroom Extract Complex Rash  . Pineapple Rash    Social History   Tobacco Use  . Smoking status: Former Smoker    Types: Cigars  . Smokeless tobacco: Never Used  Substance Use Topics  . Alcohol use: No    Family History  Problem Relation Age of Onset  . Diabetes Maternal Grandmother   . Hypertension Maternal Grandmother   . Heart disease Maternal Grandmother   . Fibromyalgia Maternal Grandmother   . Pancreatic cancer Maternal Grandfather   . Hypertension Mother   . Sickle cell anemia Father   . Colon cancer Neg Hx   . Stomach cancer Neg Hx       Review of Systems  Constitutional: negative for fatigue and weight loss Respiratory: negative for cough and wheezing Cardiovascular: negative for chest pain, fatigue and palpitations Gastrointestinal: positive for abdominal pain and change in bowel habits - constipation Musculoskeletal:negative for myalgias Neurological: negative for gait problems and tremors Behavioral/Psych: negative for abusive relationship, depression Endocrine: negative for temperature intolerance    Genitourinary: positive for abnormal menstrual periods and pelvic pain.  Negative  for genintal lesions, hot flashes, sexual problems and vaginal discharge Integument/breast: negative for breast lump, breast tenderness, nipple discharge and skin lesion(s)   Objective:       BP 116/78   Pulse 80   Ht 5\' 7"  (1.702 m)   Wt 140 lb (63.5 kg)   LMP 03/13/2021   BMI 21.93 kg/m  General:   alert and no distress  Skin:   no rash or abnormalities  Lungs:   clear to auscultation bilaterally  Heart:   regular rate and rhythm, S1, S2 normal, no murmur, click, rub or gallop  Breasts:   normal without  suspicious masses, skin or nipple changes or axillary nodes  Abdomen:  normal findings: no organomegaly, soft, non-tender and no hernia  Pelvis:  External genitalia: normal general appearance Urinary system: urethral meatus normal and bladder without fullness, nontender Vaginal: normal without tenderness, induration or masses Cervix: normal appearance Adnexa: bilateral lower pelvic tenderness.  No masses Uterus: anteverted and non-tender, normal size   Lab Review Urine pregnancy test: Negative Labs reviewed yes Radiologic studies reviewed yes   US PELVIC COMPLETE WITH TRANSVAGINAL (Accession [id number]) (Order [id number]) Imaging Date: 04/24/2021 Department: [place] Released By: Michael Boston Authorizing: Shelly Bombard, MD    Exam Status  Status  Final [99]   PACS Intelerad Image Link  Show images for US PELVIC COMPLETE WITH TRANSVAGINAL  Study Result  Narrative & Impression  CLINICAL DATA:  20 year old female with abnormal uterine bleeding and chronic pelvic pain for 2 years. LMP 03/13/2021.  EXAM: TRANSABDOMINAL AND TRANSVAGINAL ULTRASOUND OF PELVIS  TECHNIQUE: Both transabdominal and transvaginal ultrasound examinations of the pelvis were performed. Transabdominal technique was performed for global imaging of the pelvis including uterus, ovaries, adnexal regions, and pelvic cul-de-sac. It was necessary to proceed with endovaginal exam following the transabdominal exam to visualize the endometrium and adnexa.  COMPARISON:  06/20/2018 CT abdomen/pelvis.  FINDINGS: Uterus  Measurements: 7.9 x 3.4 x 4.1 cm = volume: 57 mL. Anteverted uterus is normal in size and configuration, with no uterine fibroids or other myometrial abnormalities.  Endometrium  Thickness: 5 mm. No endometrial cavity fluid or focal endometrial mass.  Right ovary  Measurements: 2.7 x 1.0 x 2.2 cm = volume: 3.0 mL. Normal appearance/no  adnexal mass.  Left ovary  Measurements: 3.6 x 1.8 x 4.0 cm = volume: 13.7 mL. Left ovarian corpus luteum. No suspicious left ovarian or left adnexal masses.  Other findings  No abnormal free fluid.  IMPRESSION: No uterine fibroids. Bilayer endometrial thickness 5 mm. No focal endometrial abnormality. If bleeding remains unresponsive to hormonal or medical therapy, sonohysterogram should be considered for focal lesion work-up. (Ref: Radiological Reasoning: Algorithmic Workup of Abnormal Vaginal Bleeding with Endovaginal Sonography and Sonohysterography. AJR 2008; [id number]:J62-83)  No ovarian or adnexal abnormality.   Electronically Signed   By: Ilona Sorrel M.D.   On: 04/24/2021 15:15         Assessment:     1. Encounter for routine gynecological examination with Papanicolaou smear of cervix Rx: - Cytology - PAP( [facility])  2. Chronic constipation, possible IBS-C Rx: - dicyclomine (BENTYL) 10 MG capsule; Take 1 capsule (10 mg total) by mouth 4 (four) times daily -  before meals and at bedtime.  Dispense: 120 capsule; Refill: 1 - Ambulatory referral to Gastroenterology  3. Deep Dyspareunia  - possible IBS.  No masses on exam.  4. Encounter for other general counseling and advice on contraception - wants OCP's  5. Encounter for initial prescription of contraceptive pills Rx: - Drospirenone (SLYND) 4 MG TABS; Take 1 tablet by mouth daily.  Dispense: 28 tablet; Refill: 11  6. Anxiety - clinically stable    Plan:    Education reviewed: calcium supplements, depression evaluation, low fat, low cholesterol diet, safe sex/STD prevention, self breast exams and weight bearing exercise. Contraception: oral progesterone-only contraceptive. Follow up in: 3 months.   Meds ordered this encounter  Medications  . Drospirenone (SLYND) 4 MG TABS    Sig: Take 1 tablet by mouth daily.    Dispense:  28 tablet    Refill:  11  . dicyclomine (BENTYL) 10 MG capsule     Sig: Take 1 capsule (10 mg total) by mouth 4 (four) times daily -  before meals and at bedtime.    Dispense:  120 capsule    Refill:  1   Orders Placed This Encounter  Procedures  . Ambulatory referral to Gastroenterology    Referral Priority:   Routine    Referral Type:   Consultation    Referral Reason:   Specialty Services Required    Number of Visits Requested:   1    Shelly Bombard, MD 04/25/2021 9:27 AM

## 2021-04-27 LAB — CYTOLOGY - PAP: Diagnosis: NEGATIVE

## 2021-06-19 ENCOUNTER — Other Ambulatory Visit: Payer: Self-pay

## 2021-06-19 ENCOUNTER — Other Ambulatory Visit: Payer: Self-pay | Admitting: Obstetrics

## 2021-06-19 ENCOUNTER — Ambulatory Visit (INDEPENDENT_AMBULATORY_CARE_PROVIDER_SITE_OTHER): Payer: Medicaid Other | Admitting: Obstetrics

## 2021-06-19 ENCOUNTER — Ambulatory Visit: Payer: Medicaid Other | Admitting: Obstetrics

## 2021-06-19 ENCOUNTER — Other Ambulatory Visit (HOSPITAL_COMMUNITY)
Admission: RE | Admit: 2021-06-19 | Discharge: 2021-06-19 | Disposition: A | Discharge status: Home / Self Care | Payer: Medicaid Other | Source: Ambulatory Visit | Attending: Obstetrics | Admitting: Obstetrics

## 2021-06-19 ENCOUNTER — Encounter: Payer: Self-pay | Admitting: Obstetrics

## 2021-06-19 VITALS — BP 127/93 | HR 107 | Wt 139.0 lb

## 2021-06-19 DIAGNOSIS — Z113 Encounter for screening for infections with a predominantly sexual mode of transmission: Secondary | ICD-10-CM | POA: Diagnosis present

## 2021-06-19 DIAGNOSIS — N926 Irregular menstruation, unspecified: Secondary | ICD-10-CM

## 2021-06-19 DIAGNOSIS — N898 Other specified noninflammatory disorders of vagina: Secondary | ICD-10-CM

## 2021-06-19 LAB — POCT URINE PREGNANCY: Preg Test, Ur: NEGATIVE

## 2021-06-19 NOTE — Progress Notes (Signed)
Patient ID: [name], female   DOB: Nov 28, 2001, 20 y.o.   MRN: [id number]  Chief Complaint  Patient presents with   Vaginitis    HPI Andrea Archer is a 20 y.o. female.  Complains of missed period, and also has vaginal discharge / irritation.  Negative subjective signs of pregnancy. HPI  Past Medical History:  Diagnosis Date   Anxiety    Depression    Hypertension    Pregnant    Sickle cell trait ([place])     Past Surgical History:  Procedure Laterality Date   FINGER SURGERY Left    Pinky Finger   FRACTURE SURGERY  01/09/2019   left pinky finger    Family History  Problem Relation Age of Onset   Diabetes Maternal Grandmother    Hypertension Maternal Grandmother    Heart disease Maternal Grandmother    Fibromyalgia Maternal Grandmother    Pancreatic cancer Maternal Grandfather    Hypertension Mother    Sickle cell anemia Father    Colon cancer Neg Hx    Stomach cancer Neg Hx     Social History Social History   Tobacco Use   Smoking status: Former    Pack years: 0.00    Types: Cigars   Smokeless tobacco: Never  Vaping Use   Vaping Use: Never used  Substance Use Topics   Alcohol use: No   Drug use: No    Allergies  Allergen Reactions   Mushroom Extract Complex Rash   Pineapple Rash    Current Outpatient Medications  Medication Sig Dispense Refill   dicyclomine (BENTYL) 10 MG capsule Take 1 capsule (10 mg total) by mouth 4 (four) times daily -  before meals and at bedtime. (Patient not taking: Reported on 06/19/2021) 120 capsule 1   Drospirenone (SLYND) 4 MG TABS Take 1 tablet by mouth daily. (Patient not taking: Reported on 06/19/2021) 28 tablet 11   Elastic Bandages & Supports (WRIST BRACE/RIGHT SMALL) MISC 1 Device by Does not apply route daily. (Patient not taking: No sig reported) 1 each 0   No current facility-administered medications for this visit.    Review of Systems Review of Systems Constitutional: negative for fatigue and weight  loss Respiratory: negative for cough and wheezing Cardiovascular: negative for chest pain, fatigue and palpitations Gastrointestinal: negative for abdominal pain and change in bowel habits Genitourinary: positive for vaginal discharge / irritation  Integument/breast: negative for nipple discharge Musculoskeletal:negative for myalgias Neurological: negative for gait problems and tremors Behavioral/Psych: negative for abusive relationship, depression Endocrine: negative for temperature intolerance      Blood pressure (!) 127/93, pulse (!) 107, weight 139 lb (63 kg), currently breastfeeding.  Physical Exam Physical Exam General:   Alert and no distress  Skin:   no rash or abnormalities  Lungs:   clear to auscultation bilaterally  Heart:   regular rate and rhythm, S1, S2 normal, no murmur, click, rub or gallop  Breasts:   normal without suspicious masses, skin or nipple changes or axillary nodes  Abdomen:  normal findings: no organomegaly, soft, non-tender and no hernia  Pelvis:  External genitalia: normal general appearance Urinary system: urethral meatus normal and bladder without fullness, nontender Vaginal: normal without tenderness, induration or masses Cervix: normal appearance Adnexa: normal bimanual exam Uterus: anteverted and non-tender, normal size    I have spent a total of 15 minutes of face-to-face time, excluding clinical staff time, reviewing notes and preparing to see patient, ordering tests and/or medications, and counseling the patient.  Data Reviewed UPT  Assessment      1. Missed period Rx: - POCT urine pregnancy:  Negative  2. Vaginal discharge Rx: - Cervicovaginal ancillary only( [facility])  3. Screening for STD (sexually transmitted disease) Rx: - HIV Antibody (routine testing w rflx) - Hepatitis B surface antigen - RPR - Hepatitis C antibody   Plan   Follow up prn  Orders Placed This Encounter  Procedures   HIV Antibody (routine testing w  rflx)   Hepatitis B surface antigen   RPR   Hepatitis C antibody   POCT urine pregnancy     Shelly Bombard, MD 06/19/2021 2:27 PM

## 2021-06-19 NOTE — Progress Notes (Signed)
RGYN pt presents for problem visit c/o vaginal irritation possible yeast infection and requesting UPT. Took 2 at home test both were negative 06/03/21 and 06/15/21  Pt worried may be pregnant would like HCG drawn.    LMP:05/02/21  UPT: Negative

## 2021-06-20 LAB — HIV ANTIBODY (ROUTINE TESTING W REFLEX): HIV Screen 4th Generation wRfx: NONREACTIVE

## 2021-06-20 LAB — HEPATITIS C ANTIBODY: Hep C Virus Ab: <0.1 s/co ratio (ref 0.0–0.9)

## 2021-06-20 LAB — HEPATITIS B SURFACE ANTIGEN: Hepatitis B Surface Ag: NEGATIVE

## 2021-06-20 LAB — RPR: RPR Ser Ql: NONREACTIVE

## 2021-06-21 LAB — CERVICOVAGINAL ANCILLARY ONLY
Bacterial Vaginitis (gardnerella): NEGATIVE
Candida Glabrata: NEGATIVE
Candida Vaginitis: NEGATIVE
Chlamydia: NEGATIVE
Comment: NEGATIVE
Comment: NEGATIVE
Comment: NEGATIVE
Comment: NEGATIVE
Comment: NEGATIVE
Comment: NORMAL
Neisseria Gonorrhea: NEGATIVE
Trichomonas: NEGATIVE

## 2021-06-26 ENCOUNTER — Telehealth (INDEPENDENT_AMBULATORY_CARE_PROVIDER_SITE_OTHER): Payer: Medicaid Other | Admitting: Obstetrics

## 2021-06-26 ENCOUNTER — Encounter: Payer: Self-pay | Admitting: Obstetrics

## 2021-06-26 DIAGNOSIS — N926 Irregular menstruation, unspecified: Secondary | ICD-10-CM | POA: Diagnosis not present

## 2021-06-26 NOTE — Progress Notes (Signed)
TELEHEALTH GYNECOLOGY VISIT ENCOUNTER NOTE  Provider location: [facility] [place] [facility]   Patient location: Home  I connected with Andrea Archer on 06/26/21 at 10:15 AM EDT by telephone and verified that I am speaking with the correct person using two identifiers. Patient was unable to do MyChart audiovisual encounter due to technical difficulties, she tried several times.    I discussed the limitations, risks, security and privacy concerns of performing an evaluation and management service by telephone and the availability of in person appointments. I also discussed with the patient that there may be a patient responsible charge related to this service. The patient expressed understanding and agreed to proceed.   History:  Andrea Archer is a 20 y.o. G43P1001 female being evaluated today for missed periods for past 2 months. She denies any abnormal vaginal discharge, bleeding, pelvic pain or other concerns.  Had a baby in November 2021 and breast fed after delivery and was on Depo injections for contraception until March 2022.  Discontinued Depo in March 2022 and was Rx Slynd but did not start it..  Did not have period in April, May or June.     Past Medical History:  Diagnosis Date   Anxiety    Depression    Hypertension    Pregnant    Sickle cell trait ([place])    Past Surgical History:  Procedure Laterality Date   FINGER SURGERY Left    Pinky Finger   FRACTURE SURGERY  01/09/2019   left pinky finger   The following portions of the patient's history were reviewed and updated as appropriate: allergies, current medications, past family history, past medical history, past social history, past surgical history and problem list.   Health Maintenance:  Normal pap and negative HRHPV on 04-25-2021.   Review of Systems:  Pertinent items noted in HPI and remainder of comprehensive ROS otherwise negative.  Physical Exam:   General:  Alert, oriented and cooperative.   Mental  Status: Normal mood and affect perceived. Normal judgment and thought content.  Physical exam deferred due to nature of the encounter  Labs and Imaging Results for orders placed or performed in visit on 06/19/21 (from the past 336 hour(s))  RPR   Collection Time: 06/19/21  2:40 PM  Result Value Ref Range   RPR Ser Ql Non Reactive Non Reactive  HIV Antibody (routine testing w rflx)   Collection Time: 06/19/21  2:40 PM  Result Value Ref Range   HIV Screen 4th Generation wRfx Non Reactive Non Reactive  Hepatitis C antibody   Collection Time: 06/19/21  2:40 PM  Result Value Ref Range   Hep C Virus Ab <0.1 0.0 - 0.9 s/co ratio  Hepatitis B surface antigen   Collection Time: 06/19/21  2:40 PM  Result Value Ref Range   Hepatitis B Surface Ag Negative Negative  Results for orders placed or performed in visit on 06/19/21 (from the past 336 hour(s))  Cervicovaginal ancillary only( [place])   Collection Time: 06/19/21  2:13 PM  Result Value Ref Range   Neisseria Gonorrhea Negative    Chlamydia Negative    Trichomonas Negative    Bacterial Vaginitis (gardnerella) Negative    Candida Vaginitis Negative    Candida Glabrata Negative    Comment Normal Reference Range Candida Species - Negative    Comment Normal Reference Range Candida Galbrata - Negative    Comment Normal Reference Range Trichomonas - Negative    Comment      Normal  Reference Range Bacterial Vaginosis - Negative   Comment Normal Reference [place] Chlamydia - Negative    Comment      Normal Reference Range Neisseria Gonorrhea - Negative  POCT urine pregnancy   Collection Time: 06/19/21  2:16 PM  Result Value Ref Range   Preg Test, Ur Negative Negative   No results found.    Assessment and Plan:     1. Missed period - probable post-Depo amenorrhea - will follow conservatively for another month, and consider inducing a withdrawal bleed with norethindrone if no period in July       I discussed the assessment and  treatment plan with the patient. The patient was provided an opportunity to ask questions and all were answered. The patient agreed with the plan and demonstrated an understanding of the instructions.   The patient was advised to call back or seek an in-person evaluation/go to the ED if the symptoms worsen or if the condition fails to improve as anticipated.  I have spent a total of 15 minutes of non-face-to-face time, excluding clinical staff time, reviewing notes and preparing to see patient, ordering tests and/or medications, and counseling the patient.    Baltazar Najjar, MD [facility] for [facility], [place] [facility]  06/26/21

## 2021-06-26 NOTE — Progress Notes (Signed)
UPT on 06/25 Negative

## 2021-07-06 ENCOUNTER — Telehealth (INDEPENDENT_AMBULATORY_CARE_PROVIDER_SITE_OTHER): Payer: Medicaid Other | Admitting: Obstetrics

## 2021-07-06 ENCOUNTER — Encounter: Payer: Self-pay | Admitting: Obstetrics

## 2021-07-06 DIAGNOSIS — N939 Abnormal uterine and vaginal bleeding, unspecified: Secondary | ICD-10-CM | POA: Diagnosis not present

## 2021-07-06 DIAGNOSIS — N926 Irregular menstruation, unspecified: Secondary | ICD-10-CM | POA: Diagnosis not present

## 2021-07-06 DIAGNOSIS — N946 Dysmenorrhea, unspecified: Secondary | ICD-10-CM | POA: Diagnosis not present

## 2021-07-06 NOTE — Progress Notes (Signed)
   TELEHEALTH GYNECOLOGY VISIT ENCOUNTER NOTE  Provider location: [facility] for [place] [facility]   Patient location: Home  I connected with Andrea Archer on 07/06/21 at  1:45 PM EDT by telephone and verified that I am speaking with the correct person using two identifiers. Patient was unable to do MyChart audiovisual encounter due to technical difficulties, she tried several times.    I discussed the limitations, risks, security and privacy concerns of performing an evaluation and management service by telephone and the availability of in person appointments. I also discussed with the patient that there may be a patient responsible charge related to this service. The patient expressed understanding and agreed to proceed.   History:  Andrea Archer is a 20 y.o. G48P1001 female being evaluated today for history of amenorrhea after discontinuing Depo.  She states that she has now started a period that is accompanied by clots and moderate cramping, which is not like her usual period.  She denies any abnormal vaginal discharge, bleeding, pelvic pain or other concerns.       Past Medical History:  Diagnosis Date   Anxiety    Depression    Hypertension    Pregnant    Sickle cell trait ([place])    Past Surgical History:  Procedure Laterality Date   FINGER SURGERY Left    Pinky Finger   FRACTURE SURGERY  01/09/2019   left pinky finger   The following portions of the patient's history were reviewed and updated as appropriate: allergies, current medications, past family history, past medical history, past social history, past surgical history and problem list.   Health Maintenance:  Normal pap and negative HRHPV on 04-25-2021.    Review of Systems:  Pertinent items noted in HPI and remainder of comprehensive ROS otherwise negative.  Physical Exam:   General:  Alert, oriented and cooperative.   Mental Status: Normal mood and affect perceived. Normal judgment and thought content.   Physical exam deferred due to nature of the encounter  Labs and Imaging No results found for this or any previous visit (from the past 336 hour(s)). No results found.   Assessment and Plan:     1. Missed period, resolving  2. Abnormal uterine bleeding (AUB) - O - probable hormonal imbalance - OCP;s recommended for cycle regulation  3. Dysmenorrhea - Ibuprofen 800 mg  po every 8 hours prn      I discussed the assessment and treatment plan with the patient. The patient was provided an opportunity to ask questions and all were answered. The patient agreed with the plan and demonstrated an understanding of the instructions.   The patient was advised to call back or seek an in-person evaluation/go to the ED if the symptoms worsen or if the condition fails to improve as anticipated.  I have spent a total of 15 minutes of non-face-to-face time, excluding clinical staff time, reviewing notes and preparing to see patient, ordering tests and/or medications, and counseling the patient.    Baltazar Najjar, MD [facility] for [facility], [place] [facility]  07/06/21

## 2021-07-06 NOTE — Progress Notes (Signed)
Virtual Visit via Telephone Note  I connected with Andrea Archer on 07/06/21 at  1:45 PM EDT by telephone and verified that I am speaking with the correct person using two identifiers.  Pt c/o brown vaginal bleeding.

## 2021-08-07 ENCOUNTER — Encounter: Payer: Self-pay | Admitting: Nurse Practitioner

## 2021-09-01 ENCOUNTER — Ambulatory Visit: Payer: Medicaid Other | Admitting: Nurse Practitioner

## 2021-09-08 ENCOUNTER — Other Ambulatory Visit (HOSPITAL_COMMUNITY)
Admission: RE | Admit: 2021-09-08 | Discharge: 2021-09-08 | Disposition: A | Discharge status: Home / Self Care | Payer: Medicaid Other | Source: Ambulatory Visit | Attending: Obstetrics | Admitting: Obstetrics

## 2021-09-08 ENCOUNTER — Ambulatory Visit (INDEPENDENT_AMBULATORY_CARE_PROVIDER_SITE_OTHER): Payer: Medicaid Other

## 2021-09-08 ENCOUNTER — Other Ambulatory Visit: Payer: Self-pay

## 2021-09-08 VITALS — BP 126/83 | HR 90 | Ht 67.0 in | Wt 138.0 lb

## 2021-09-08 DIAGNOSIS — N898 Other specified noninflammatory disorders of vagina: Secondary | ICD-10-CM

## 2021-09-08 NOTE — Progress Notes (Signed)
SUBJECTIVE:  20 y.o. female complains of white and thick vaginal discharge, itching and burning for 3 day(s). Denies abnormal vaginal bleeding or significant pelvic pain or fever. No UTI symptoms. Denies history of known exposure to STD.  No LMP recorded (lmp unknown). (Menstrual status: Irregular Periods).  OBJECTIVE:  She appears well, afebrile. Urine dipstick: not done.  ASSESSMENT:  Vaginal Discharge  Vaginal Odor   PLAN:  GC, chlamydia, trichomonas, BVAG, CVAG probe sent to lab. Treatment: To be determined once lab results are received ROV prn if symptoms persist or worsen.

## 2021-09-09 ENCOUNTER — Inpatient Hospital Stay (HOSPITAL_COMMUNITY)
Admission: AD | Admit: 2021-09-09 | Discharge: 2021-09-09 | Disposition: A | Discharge status: Home / Self Care | Payer: Medicaid Other | Attending: Obstetrics & Gynecology | Admitting: Obstetrics & Gynecology

## 2021-09-09 DIAGNOSIS — B373 Candidiasis of vulva and vagina: Secondary | ICD-10-CM | POA: Diagnosis present

## 2021-09-09 DIAGNOSIS — Z3202 Encounter for pregnancy test, result negative: Secondary | ICD-10-CM | POA: Insufficient documentation

## 2021-09-09 DIAGNOSIS — Z789 Other specified health status: Secondary | ICD-10-CM

## 2021-09-09 DIAGNOSIS — B3731 Acute candidiasis of vulva and vagina: Secondary | ICD-10-CM

## 2021-09-09 LAB — POCT PREGNANCY, URINE: Preg Test, Ur: NEGATIVE

## 2021-09-09 MED ORDER — FLUCONAZOLE 150 MG PO TABS: 150.0000 mg | ORAL_TABLET | Freq: Once | ORAL | 1 refills | Status: AC

## 2021-09-09 NOTE — MAU Note (Signed)
Pt reports she has burning itching and swelling to her vaginal area for 2 days. Went to [facility] yesterday and had testing done but results are not back. Pt stated she cant' take the discomfort. Not sure if she is pregnant

## 2021-09-09 NOTE — MAU Provider Note (Signed)
Event Date/Time   First Provider Initiated Contact with Patient 09/09/21 0036      S [name] is a 20 y.o. G1P1001 patient who presents to MAU today with complaint of vaginal swelling and itching.  She states about 2 days ago she started experiencing cottage like d/c without odor.  She states she also nted some swelling of her labia and "pelvic glands."  She states she tried one day monistat treatment and cold compresses with no improvement    O BP 123/75   Pulse 94   Temp 98.1 F (36.7 C)   Resp 18   LMP 08/09/2021 (Approximate)  Physical Exam Vitals reviewed. Exam conducted with a chaperone present.  Constitutional:      General: She is not in acute distress.    Appearance: Normal appearance.  HENT:     Head: Normocephalic and atraumatic.  Eyes:     Conjunctiva/sclera: Conjunctivae normal.  Cardiovascular:     Rate and Rhythm: Normal rate.  Pulmonary:     Effort: Pulmonary effort is normal. No respiratory distress.  Musculoskeletal:        General: Normal range of motion.     Cervical back: Normal range of motion.  Skin:    General: Skin is warm and dry.  Neurological:     Mental Status: She is alert and oriented to person, place, and time.  Psychiatric:        Mood and Affect: Mood normal.        Behavior: Behavior normal.        Thought Content: Thought content normal.    A Medical screening exam complete Vaginal Candidiasis Non pregnant state  P -Reassured that UPT negative. -Discussed that no labs will be collected today. -Informed that labs still pending from office visit. -Discussed treatment with diflucan. -Instructed to take one dose tomorrow and then repeat in 72 hours if no improvement. -Informed that additional treatment may be necessary considering findings from CV. -Patient questions how to relieve vaginal swelling.  Provider suggest sitz bath with baking soda. -Instructed to follow up with [facility] as needed.  -Discharge from MAU in stable  condition  Gavin Pound, CNM 09/09/2021 12:36 AM

## 2021-09-11 LAB — CERVICOVAGINAL ANCILLARY ONLY
Bacterial Vaginitis (gardnerella): NEGATIVE
Candida Glabrata: NEGATIVE
Candida Vaginitis: POSITIVE — AB
Chlamydia: NEGATIVE
Comment: NEGATIVE
Comment: NEGATIVE
Comment: NEGATIVE
Comment: NEGATIVE
Comment: NEGATIVE
Comment: NORMAL
Neisseria Gonorrhea: NEGATIVE
Trichomonas: NEGATIVE

## 2021-09-11 NOTE — Progress Notes (Signed)
Attestation of Attending Supervision of clinical support staff: I agree with the care provided to this patient and was available for any consultation.  I have reviewed the CMA's note and chart, and I agree with the management and plan.  Rickelle Sylvestre MD MPH, ABFM Attending Physician Faculty Practice- [facility]  

## 2021-09-22 ENCOUNTER — Telehealth: Payer: Medicaid Other | Admitting: Nurse Practitioner

## 2021-09-22 DIAGNOSIS — J00 Acute nasopharyngitis [common cold]: Secondary | ICD-10-CM

## 2021-09-22 NOTE — Progress Notes (Signed)
Virtual Visit Consent   Andrea Archer, you are scheduled for a virtual visit with Mary-Margaret Hassell Done, [place], a [facility] provider, today.     Just as with appointments in the office, your consent must be obtained to participate.  Your consent will be active for this visit and any virtual visit you may have with one of our providers in the next 365 days.     If you have a MyChart account, a copy of this consent can be sent to you electronically.  All virtual visits are billed to your insurance company just like a traditional visit in the office.    As this is a virtual visit, video technology does not allow for your provider to perform a traditional examination.  This may limit your provider's ability to fully assess your condition.  If your provider identifies any concerns that need to be evaluated in person or the need to arrange testing (such as labs, EKG, etc.), we will make arrangements to do so.     Although advances in technology are sophisticated, we cannot ensure that it will always work on either your end or our end.  If the connection with a video visit is poor, the visit may have to be switched to a telephone visit.  With either a video or telephone visit, we are not always able to ensure that we have a secure connection.     I need to obtain your verbal consent now.   Are you willing to proceed with your visit today? YES   Andrea Archer has provided verbal consent on 09/22/2021 for a virtual visit (video or telephone).   Mary-Margaret Hassell Done, FNP   Date: 09/22/2021 5:51 PM   Virtual Visit via Video Note   I, Mary-Margaret Hassell Done, connected with Andrea Archer ([id number], March 15, 2001) on 09/22/21 at  6:15 PM EDT by a video-enabled telemedicine application and verified that I am speaking with the correct person using two identifiers.  Location: Patient: Virtual Visit Location Patient: Home Provider: Virtual Visit Location Provider: Mobile   I discussed the limitations of  evaluation and management by telemedicine and the availability of in person appointments. The patient expressed understanding and agreed to proceed.    History of Present Illness: Andrea Archer is a 20 y.o. who identifies as a female who was assigned female at birth, and is being seen today for cold symptoms.  HPI: Patient states that 3 days ago she developed sore thorat. As days have progressed she has itchy throat, cough, chest feels tight when cughing, has myalgia. She has not tested for covid.   Review of Systems  Constitutional:  Positive for malaise/fatigue. Negative for chills and fever.  HENT:  Positive for congestion, ear pain and sore throat. Negative for ear discharge and sinus pain.   Respiratory:  Positive for cough, sputum production and shortness of breath (occasiolnally).   Musculoskeletal:  Positive for myalgias.  Neurological:  Negative for dizziness and headaches.  All other systems reviewed and are negative.  Problems:  Patient Active Problem List   Diagnosis Date Noted   History of gestational hypertension 12/21/2019   Lactating mother 12/21/2019   Sickle cell trait ([place]) 05/04/2019   Change in bowel habits 06/18/2018   Left lower quadrant pain 06/17/2018    Allergies:  Allergies  Allergen Reactions   Mushroom Extract Complex Rash   Pineapple Rash   Medications: No current outpatient medications on file.  Observations/Objective: Patient is well-developed, well-nourished in no acute distress.  Resting comfortably  at home.  Head is normocephalic, atraumatic.  No labored breathing.  Speech is clear and coherent with logical content.  Patient is alert and oriented at baseline.  No cough during visit.  Assessment and Plan:  Andrea Archer in today with chief complaint of URI   1. Acute nasopharyngitis 1. Take meds as prescribed 2. Use a cool mist humidifier especially during the winter months and when heat has been humid. 3. Use saline nose sprays  frequently 4. Saline irrigations of the nose can be very helpful if done frequently.  * 4X daily for 1 week*  * Use of a nettie pot can be helpful with this. Follow directions with this* 5. Drink plenty of fluids 6. Keep thermostat turn down low 7.For any cough or congestion  Use plain Mucinex- regular strength or max strength is fine   * Children- consult with Pharmacist for dosing 8. For fever or aces or pains- take tylenol or ibuprofen appropriate for age and weight.  * for fevers greater than 101 orally you may alternate ibuprofen and tylenol every  3 hours.   Oateint told she needs covid testing- If test positive contact sus back and let us know and we will treat you then     Follow Up Instructions: I discussed the assessment and treatment plan with the patient. The patient was provided an opportunity to ask questions and all were answered. The patient agreed with the plan and demonstrated an understanding of the instructions.  A copy of instructions were sent to the patient via MyChart.  The patient was advised to call back or seek an in-person evaluation if the symptoms worsen or if the condition fails to improve as anticipated.  Time:  I spent 12 minutes with the patient via telehealth technology discussing the above problems/concerns.    Mary-Margaret Hassell Done, FNP

## 2021-10-06 ENCOUNTER — Ambulatory Visit: Payer: Medicaid Other | Admitting: Gastroenterology

## 2021-10-10 ENCOUNTER — Ambulatory Visit: Payer: Medicaid Other | Admitting: Gastroenterology

## 2021-10-10 ENCOUNTER — Encounter: Payer: Self-pay | Admitting: Gastroenterology

## 2021-10-10 VITALS — BP 110/80 | HR 77 | Ht 67.0 in | Wt 145.0 lb

## 2021-10-10 DIAGNOSIS — K625 Hemorrhage of anus and rectum: Secondary | ICD-10-CM

## 2021-10-10 DIAGNOSIS — R1031 Right lower quadrant pain: Secondary | ICD-10-CM

## 2021-10-10 DIAGNOSIS — K219 Gastro-esophageal reflux disease without esophagitis: Secondary | ICD-10-CM | POA: Diagnosis not present

## 2021-10-10 DIAGNOSIS — R12 Heartburn: Secondary | ICD-10-CM | POA: Diagnosis not present

## 2021-10-10 MED ORDER — OMEPRAZOLE 20 MG PO CPDR: 20.0000 mg | DELAYED_RELEASE_CAPSULE | Freq: Every day | ORAL | 3 refills | Status: DC

## 2021-10-10 NOTE — Patient Instructions (Signed)
You have been scheduled for a colonoscopy. Please follow written instructions given to you at your visit today.  Please pick up your prep supplies at the pharmacy within the next 1-3 days. If you use inhalers (even only as needed), please bring them with you on the day of your procedure.   We have sent Omeprazole to your pharmacy  Gastroesophageal Reflux Disease, Adult Gastroesophageal reflux (GER) happens when acid from the stomach flows up into the tube that connects the mouth and the stomach (esophagus). Normally, food travels down the esophagus and stays in the stomach to be digested. However, when a person has GER, food and stomach acid sometimes move back up into the esophagus. If this becomes a more serious problem, the person may be diagnosed with a disease called gastroesophageal reflux disease (GERD). GERD occurs when the reflux: Happens often. Causes frequent or severe symptoms. Causes problems such as damage to the esophagus. When stomach acid comes in contact with the esophagus, the acid may cause inflammation in the esophagus. Over time, GERD may create small holes (ulcers) in the lining of the esophagus. What are the causes? This condition is caused by a problem with the muscle between the esophagus and the stomach (lower esophageal sphincter, or LES). Normally, the LES muscle closes after food passes through the esophagus to the stomach. When the LES is weakened or abnormal, it does not close properly, and that allows food and stomach acid to go back up into the esophagus. The LES can be weakened by certain dietary substances, medicines, and medical conditions, including: Tobacco use. Pregnancy. Having a hiatal hernia. Alcohol use. Certain foods and beverages, such as coffee, chocolate, onions, and peppermint. What increases the risk? You are more likely to develop this condition if you: Have an increased body weight. Have a connective tissue disorder. Take NSAIDs, such as  ibuprofen. What are the signs or symptoms? Symptoms of this condition include: Heartburn. Difficult or painful swallowing and the feeling of having a lump in the throat. A bitter taste in the mouth. Bad breath and having a large amount of saliva. Having an upset or bloated stomach and belching. Chest pain. Different conditions can cause chest pain. Make sure you see your health care provider if you experience chest pain. Shortness of breath or wheezing. Ongoing (chronic) cough or a nighttime cough. Wearing away of tooth enamel. Weight loss. How is this diagnosed? This condition may be diagnosed based on a medical history and a physical exam. To determine if you have mild or severe GERD, your health care provider may also monitor how you respond to treatment. You may also have tests, including: A test to examine your stomach and esophagus with a small camera (endoscopy). A test that measures the acidity level in your esophagus. A test that measures how much pressure is on your esophagus. A barium swallow or modified barium swallow test to show the shape, size, and functioning of your esophagus. How is this treated? Treatment for this condition may vary depending on how severe your symptoms are. Your health care provider may recommend: Changes to your diet. Medicine. Surgery. The goal of treatment is to help relieve your symptoms and to prevent complications. Follow these instructions at home: Eating and drinking  Follow a diet as recommended by your health care provider. This may involve avoiding foods and drinks such as: Coffee and tea, with or without caffeine. Drinks that contain alcohol. Energy drinks and sports drinks. Carbonated drinks or sodas. Chocolate and cocoa. Peppermint  and mint flavorings. Garlic and onions. Horseradish. Spicy and acidic foods, including peppers, chili powder, curry powder, vinegar, hot sauces, and barbecue sauce. Citrus fruit juices and citrus  fruits, such as oranges, lemons, and limes. Tomato-based foods, such as red sauce, chili, salsa, and pizza with red sauce. Fried and fatty foods, such as donuts, french fries, potato chips, and high-fat dressings. High-fat meats, such as hot dogs and fatty cuts of red and white meats, such as rib eye steak, sausage, ham, and bacon. High-fat dairy items, such as whole milk, butter, and cream cheese. Eat small, frequent meals instead of large meals. Avoid drinking large amounts of liquid with your meals. Avoid eating meals during the 2-3 hours before bedtime. Avoid lying down right after you eat. Do not exercise right after you eat. Lifestyle  Do not use any products that contain nicotine or tobacco. These products include cigarettes, chewing tobacco, and vaping devices, such as e-cigarettes. If you need help quitting, ask your health care provider. Try to reduce your stress by using methods such as yoga or meditation. If you need help reducing stress, ask your health care provider. If you are overweight, reduce your weight to an amount that is healthy for you. Ask your health care provider for guidance about a safe weight loss goal. General instructions Pay attention to any changes in your symptoms. Take over-the-counter and prescription medicines only as told by your health care provider. Do not take aspirin, ibuprofen, or other NSAIDs unless your health care provider told you to take these medicines. Wear loose-fitting clothing. Do not wear anything tight around your waist that causes pressure on your abdomen. Raise (elevate) the head of your bed about 6 inches (15 cm). You can use a wedge to do this. Avoid bending over if this makes your symptoms worse. Keep all follow-up visits. This is important. Contact a health care provider if: You have: New symptoms. Unexplained weight loss. Difficulty swallowing or it hurts to swallow. Wheezing or a persistent cough. A hoarse voice. Your  symptoms do not improve with treatment. Get help right away if: You have sudden pain in your arms, neck, jaw, teeth, or back. You suddenly feel sweaty, dizzy, or light-headed. You have chest pain or shortness of breath. You vomit and the vomit is green, yellow, or black, or it looks like blood or coffee grounds. You faint. You have stool that is red, bloody, or black. You cannot swallow, drink, or eat. These symptoms may represent a serious problem that is an emergency. Do not wait to see if the symptoms will go away. Get medical help right away. Call your local emergency services (911 in the [place] Do not drive yourself to the hospital. Summary Gastroesophageal reflux happens when acid from the stomach flows up into the esophagus. GERD is a disease in which the reflux happens often, causes frequent or severe symptoms, or causes problems such as damage to the esophagus. Treatment for this condition may vary depending on how severe your symptoms are. Your health care provider may recommend diet and lifestyle changes, medicine, or surgery. Contact a health care provider if you have new or worsening symptoms. Take over-the-counter and prescription medicines only as told by your health care provider. Do not take aspirin, ibuprofen, or other NSAIDs unless your health care provider told you to do so. Keep all follow-up visits as told by your health care provider. This is important. This information is not intended to replace advice given to you by your health care  provider. Make sure you discuss any questions you have with your health care provider. Document Revised: 06/27/2020 Document Reviewed: 06/27/2020 Elsevier Patient Education  [place].   I appreciate the  opportunity to care for you  Thank You   Harl Bowie , MD

## 2021-10-10 NOTE — Progress Notes (Signed)
Andrea Archer    [id number]    August 28, 2001  Primary Care Physician:Artis, Carrolyn Meiers, MD  Referring Physician: Kirkland Hun, MD [address],  [place] [address]   Chief complaint:  Rectal bleeding, lower abd pain  HPI:  20 year old female with complaints of lower abdominal pain R>L and intermittent rectal bleeding.  She has been having on and off symptoms for past 2 to 3 years.  Her symptoms are worse around her menstrual cycle.  She has constant mild discomfort in the right lower quadrant with intermittent exacerbation. Denies significant diarrhea or constipation though she does have intermittent irregularity of bowel habits. No significant weight loss or decreased appetite Has intermittent heartburn, is currently not on any acid suppressive medication.  Denies any dysphagia, odynophagia, vomiting or melena. She was recently treated for Candida infection of vulva and vagina No family history of IBD or celiac disease.  Her grandmother had colon cancer  Last GI office visit in June 2019  No outpatient encounter medications on file as of 10/10/2021.   No facility-administered encounter medications on file as of 10/10/2021.    Allergies as of 10/10/2021 - Review Complete 10/10/2021  Allergen Reaction Noted   Mushroom extract complex Rash 11/16/2019   Pineapple Rash 11/16/2019    Past Medical History:  Diagnosis Date   Anxiety    Depression    Hypertension    Pregnant    Sickle cell trait ([place])     Past Surgical History:  Procedure Laterality Date   FINGER SURGERY Left    Pinky Finger   FRACTURE SURGERY  01/09/2019   left pinky finger    Family History  Problem Relation Age of Onset   Diabetes Maternal Grandmother    Hypertension Maternal Grandmother    Heart disease Maternal Grandmother    Fibromyalgia Maternal Grandmother    Pancreatic cancer Maternal Grandfather    Hypertension Mother    Sickle cell anemia Father    Colon  cancer Neg Hx    Stomach cancer Neg Hx     Social History   Socioeconomic History   Marital status: Significant Other    Spouse name: Not on file   Number of children: Not on file   Years of education: Not on file   Highest education level: Not on file  Occupational History   Not on file  Tobacco Use   Smoking status: Former    Types: Cigars   Smokeless tobacco: Never  Vaping Use   Vaping Use: Never used  Substance and Sexual Activity   Alcohol use: Yes    Comment: Occ, more recent due to stress   Drug use: No   Sexual activity: Yes    Partners: Male    Birth control/protection: Injection  Other Topics Concern   Not on file  Social History Narrative   Not on file   Social Determinants of Health   Financial Resource Strain: Not on file  Food Insecurity: Not on file  Transportation Needs: Not on file  Physical Activity: Not on file  Stress: Not on file  Social Connections: Not on file  Intimate Partner Violence: Not on file      Review of systems: All other review of systems negative except as mentioned in the HPI.   Physical Exam: Vitals:   10/10/21 0902  BP: 110/80  Pulse: 77  SpO2: 100%   Body mass index is 22.71 kg/m. Gen:  No acute distress HEENT:  sclera anicteric Abd:      soft, non-tender; no palpable masses, no distension Ext:    No edema Neuro: alert and oriented x 3 Psych: normal mood and affect  Data Reviewed:  Reviewed labs, radiology imaging, old records and pertinent past GI work up   Assessment and Plan/Recommendations:  20 year old female with complaints of bilateral lower abdominal pain R>L and rectal bleeding Will need to exclude IBD or neoplastic lesion Schedule for colonoscopy for further evaluation The risks and benefits as well as alternatives of endoscopic procedure(s) have been discussed and reviewed. All questions answered. The patient agrees to proceed.  Heartburn likely secondary to GERD Will start omeprazole  20 mg daily for empiric therapy, if continues to have persistent symptoms will consider EGD for further evaluation Antireflux measures and lifestyle modifications  The patient was provided an opportunity to ask questions and all were answered. The patient agreed with the plan and demonstrated an understanding of the instructions.  Damaris Hippo , MD    CC: Kirkland Hun, MD

## 2021-10-17 ENCOUNTER — Telehealth: Payer: Self-pay | Admitting: Gastroenterology

## 2021-10-17 DIAGNOSIS — K625 Hemorrhage of anus and rectum: Secondary | ICD-10-CM

## 2021-10-17 NOTE — Telephone Encounter (Signed)
Rescheduled pt till 11/9, patient had to leave her office appointment early and we didn't send her instruction through My Chart Sent her Miralax instructions today

## 2021-10-17 NOTE — Telephone Encounter (Signed)
Inbound call from patient calling about questions regarding her procedure for 10/19. States at her apt she was told she would get a call with all the instructions and she have not heard anything and may need to reschedule after speaking with someone.

## 2021-10-18 ENCOUNTER — Encounter: Payer: Medicaid Other | Admitting: Gastroenterology

## 2021-11-07 ENCOUNTER — Telehealth: Payer: Self-pay

## 2021-11-07 NOTE — Telephone Encounter (Signed)
Returned patient's call, and she said she needs to cancel her Colonoscopy for tomorrow 11/08/21.States she has No money to even purchase the prep. I asked her about her symptoms and she states she still has occasional blood on tissue when she wipes, but she thinks she has a tear on her rectum.  I asked if she is constipated a lot, and she states sometimes. I asked her to drink more water to help prevent constipation. Also states her RLQ pian has improved since she stopped eating cheese. She will call us to reschedule when she can afford the prep.

## 2021-11-08 ENCOUNTER — Encounter: Payer: Medicaid Other | Admitting: Gastroenterology

## 2021-11-21 ENCOUNTER — Other Ambulatory Visit: Payer: Self-pay

## 2021-11-21 ENCOUNTER — Emergency Department (HOSPITAL_COMMUNITY)
Admission: EM | Admit: 2021-11-21 | Discharge: 2021-11-22 | Disposition: A | Discharge status: Home / Self Care | Payer: Medicaid Other | Attending: Emergency Medicine | Admitting: Emergency Medicine

## 2021-11-21 DIAGNOSIS — R110 Nausea: Secondary | ICD-10-CM | POA: Insufficient documentation

## 2021-11-21 DIAGNOSIS — Z87891 Personal history of nicotine dependence: Secondary | ICD-10-CM | POA: Insufficient documentation

## 2021-11-21 DIAGNOSIS — I880 Nonspecific mesenteric lymphadenitis: Secondary | ICD-10-CM | POA: Insufficient documentation

## 2021-11-21 DIAGNOSIS — R1032 Left lower quadrant pain: Secondary | ICD-10-CM | POA: Diagnosis present

## 2021-11-21 DIAGNOSIS — I10 Essential (primary) hypertension: Secondary | ICD-10-CM | POA: Diagnosis not present

## 2021-11-21 DIAGNOSIS — D72829 Elevated white blood cell count, unspecified: Secondary | ICD-10-CM | POA: Insufficient documentation

## 2021-11-21 DIAGNOSIS — Z20822 Contact with and (suspected) exposure to covid-19: Secondary | ICD-10-CM | POA: Diagnosis not present

## 2021-11-21 LAB — URINALYSIS, ROUTINE W REFLEX MICROSCOPIC
Bilirubin Urine: NEGATIVE
Glucose, UA: NEGATIVE mg/dL
Hgb urine dipstick: NEGATIVE
Ketones, ur: NEGATIVE mg/dL
Leukocytes,Ua: NEGATIVE
Nitrite: NEGATIVE
Protein, ur: NEGATIVE mg/dL
Specific Gravity, Urine: 1.009 (ref 1.005–1.030)
pH: 5 (ref 5.0–8.0)

## 2021-11-21 NOTE — ED Triage Notes (Addendum)
Pt c/o abd pain  for the past week. Pt reports nausea, denies vomiting.

## 2021-11-22 ENCOUNTER — Emergency Department (HOSPITAL_COMMUNITY): Payer: Medicaid Other

## 2021-11-22 LAB — COMPREHENSIVE METABOLIC PANEL
ALT: 10 U/L (ref 0–44)
AST: 16 U/L (ref 15–41)
Albumin: 3.7 g/dL (ref 3.5–5.0)
Alkaline Phosphatase: 59 U/L (ref 38–126)
Anion gap: 8 (ref 5–15)
BUN: 8 mg/dL (ref 6–20)
CO2: 23 mmol/L (ref 22–32)
Calcium: 8.9 mg/dL (ref 8.9–10.3)
Chloride: 103 mmol/L (ref 98–111)
Creatinine, Ser: 0.91 mg/dL (ref 0.44–1.00)
GFR, Estimated: >60 mL/min (ref >60)
Glucose, Bld: 75 mg/dL (ref 70–99)
Potassium: 3.8 mmol/L (ref 3.5–5.1)
Sodium: 134 mmol/L — ABNORMAL LOW (ref 135–145)
Total Bilirubin: 0.6 mg/dL (ref 0.3–1.2)
Total Protein: 6.2 g/dL — ABNORMAL LOW (ref 6.5–8.1)

## 2021-11-22 LAB — CBC
HCT: 37.6 % (ref 36.0–46.0)
Hemoglobin: 12.5 g/dL (ref 12.0–15.0)
MCH: 28 pg (ref 26.0–34.0)
MCHC: 33.2 g/dL (ref 30.0–36.0)
MCV: 84.1 fL (ref 80.0–100.0)
Platelets: 309 10*3/uL (ref 150–400)
RBC: 4.47 MIL/uL (ref 3.87–5.11)
RDW: 13.9 % (ref 11.5–15.5)
WBC: 11.5 10*3/uL — ABNORMAL HIGH (ref 4.0–10.5)
nRBC: 0 % (ref 0.0–0.2)

## 2021-11-22 LAB — I-STAT BETA HCG BLOOD, ED (MC, WL, AP ONLY): I-stat hCG, quantitative: <5 m[IU]/mL (ref <5)

## 2021-11-22 LAB — LIPASE, BLOOD: Lipase: 37 U/L (ref 11–51)

## 2021-11-22 LAB — RESP PANEL BY RT-PCR (FLU A&B, COVID) ARPGX2
Influenza A by PCR: NEGATIVE
Influenza B by PCR: NEGATIVE
SARS Coronavirus 2 by RT PCR: NEGATIVE

## 2021-11-22 MED ORDER — IOHEXOL 300 MG/ML  SOLN
100.0000 mL | Freq: Once | INTRAMUSCULAR | Status: AC | PRN
  Administered 2021-11-22: 100 mL via INTRAVENOUS

## 2021-11-22 MED ORDER — ONDANSETRON HCL 4 MG/2ML IJ SOLN
4.0000 mg | Freq: Once | INTRAMUSCULAR | Status: AC
  Administered 2021-11-22: 4 mg via INTRAVENOUS
  Filled 2021-11-22: qty 2

## 2021-11-22 MED ORDER — SODIUM CHLORIDE 0.9 % IV BOLUS
1000.0000 mL | Freq: Once | INTRAVENOUS | Status: AC
  Administered 2021-11-22: 1000 mL via INTRAVENOUS

## 2021-11-22 MED ORDER — IBUPROFEN 800 MG PO TABS: 800.0000 mg | ORAL_TABLET | Freq: Three times a day (TID) | ORAL | 0 refills | Status: DC

## 2021-11-22 MED ORDER — ONDANSETRON 4 MG PO TBDP: 4.0000 mg | ORAL_TABLET | Freq: Three times a day (TID) | ORAL | 0 refills | Status: DC | PRN

## 2021-11-22 MED ORDER — MORPHINE SULFATE (PF) 4 MG/ML IV SOLN
4.0000 mg | Freq: Once | INTRAVENOUS | Status: AC
  Administered 2021-11-22: 4 mg via INTRAVENOUS
  Filled 2021-11-22: qty 1

## 2021-11-22 NOTE — ED Provider Notes (Signed)
[facility] Provider Note   CSN: [id number] Arrival date & time: 11/21/21  2318     History Chief Complaint  Patient presents with   Abdominal Pain   Nausea    Andrea Archer is a 20 y.o. female.  Patient presents to the emergency department with a chief complaint of lower abdominal pain.  She states she has had the symptoms for about a week.  She states that they originally started in the left lower quadrant, and have now moved to her right lower quadrant.  She reports some associated nausea, but denies any vomiting.  Denies any fevers or chills.  Denies any irregular bowel movements.  Denies dysuria or hematuria.  Denies any successful treatments prior to arrival.  The history is provided by the patient. No language interpreter was used.      Past Medical History:  Diagnosis Date   Anxiety    Depression    Hypertension    Pregnant    Sickle cell trait [facility])     Patient Active Problem List   Diagnosis Date Noted   History of gestational hypertension 12/21/2019   Lactating mother 12/21/2019   Sickle cell trait ([place]) 05/04/2019   Change in bowel habits 06/18/2018   Left lower quadrant pain 06/17/2018    Past Surgical History:  Procedure Laterality Date   FINGER SURGERY Left    Pinky Finger   FRACTURE SURGERY  01/09/2019   left pinky finger     OB History     Gravida  1   Para  1   Term  1   Preterm  0   AB  0   Living  1      SAB  0   IAB  0   Ectopic  0   Multiple  0   Live Births  1           Family History  Problem Relation Age of Onset   Diabetes Maternal Grandmother    Hypertension Maternal Grandmother    Heart disease Maternal Grandmother    Fibromyalgia Maternal Grandmother    Pancreatic cancer Maternal Grandfather    Hypertension Mother    Sickle cell anemia Father    Colon cancer Neg Hx    Stomach cancer Neg Hx     Social History   Tobacco Use   Smoking status: Former     Types: Cigars   Smokeless tobacco: Never  Vaping Use   Vaping Use: Never used  Substance Use Topics   Alcohol use: Yes    Comment: Occ, more recent due to stress   Drug use: No    Home Medications Prior to Admission medications   Medication Sig Start Date End Date Taking? Authorizing Provider  omeprazole (PRILOSEC) 20 MG capsule Take 1 capsule (20 mg total) by mouth daily. 10/10/21   Mauri Pole, MD    Allergies    Mushroom extract complex and Pineapple  Review of Systems   Review of Systems  All other systems reviewed and are negative.  Physical Exam Updated Vital Signs BP 119/75 (BP Location: Right Arm)   Pulse 82   Temp 98.2 F (36.8 C) (Oral)   Resp 18   Ht 5\' 7"  (1.702 m)   Wt 69.4 kg   SpO2 100%   BMI 23.96 kg/m   Physical Exam Vitals and nursing note reviewed.  Constitutional:      General: She is not in acute distress.  Appearance: She is well-developed.  HENT:     Head: Normocephalic and atraumatic.  Eyes:     Conjunctiva/sclera: Conjunctivae normal.  Cardiovascular:     Rate and Rhythm: Normal rate and regular rhythm.     Heart sounds: No murmur heard. Pulmonary:     Effort: Pulmonary effort is normal. No respiratory distress.     Breath sounds: Normal breath sounds.  Abdominal:     Palpations: Abdomen is soft.     Tenderness: There is abdominal tenderness.  Musculoskeletal:        General: No swelling.     Cervical back: Neck supple.  Skin:    General: Skin is warm and dry.     Capillary Refill: Capillary refill takes less than 2 seconds.  Neurological:     Mental Status: She is alert.  Psychiatric:        Mood and Affect: Mood normal.    ED Results / Procedures / Treatments   Labs (all labs ordered are listed, but only abnormal results are displayed) Labs Reviewed  COMPREHENSIVE METABOLIC PANEL - Abnormal; Notable for the following components:      Result Value   Sodium 134 (*)    Total Protein 6.2 (*)    All other  components within normal limits  CBC - Abnormal; Notable for the following components:   WBC 11.5 (*)    All other components within normal limits  URINALYSIS, ROUTINE W REFLEX MICROSCOPIC - Abnormal; Notable for the following components:   Color, Urine STRAW (*)    All other components within normal limits  RESP PANEL BY RT-PCR (FLU A&B, COVID) ARPGX2  LIPASE, BLOOD  I-STAT BETA HCG BLOOD, ED ([facility], [facility], [facility] ONLY)    EKG None  Radiology CT ABDOMEN PELVIS W CONTRAST  Result Date: 11/22/2021 CLINICAL DATA:  Right lower quadrant abdominal pain. EXAM: CT ABDOMEN AND PELVIS WITH CONTRAST TECHNIQUE: Multidetector CT imaging of the abdomen and pelvis was performed using the standard protocol following bolus administration of intravenous contrast. CONTRAST:  144mL OMNIPAQUE IOHEXOL 300 MG/ML  SOLN COMPARISON:  CT abdomen pelvis dated 06/20/2018. FINDINGS: Lower chest: The visualized lung bases are clear. No intra-abdominal free air.  Small free fluid within the pelvis. Hepatobiliary: No focal liver abnormality is seen. No gallstones, gallbladder wall thickening, or biliary dilatation. Pancreas: Unremarkable. No pancreatic ductal dilatation or surrounding inflammatory changes. Spleen: Normal in size without focal abnormality. Adrenals/Urinary Tract: Adrenal glands are unremarkable. Kidneys are normal, without renal calculi, focal lesion, or hydronephrosis. Bladder is unremarkable. Stomach/Bowel: There is no bowel obstruction or active inflammation. The appendix is normal. Vascular/Lymphatic: The abdominal aorta and IVC are unremarkable. No portal venous gas. Multiple top-normal right lower quadrant lymph nodes may be reactive or represent mesenteric adenitis. Reproductive: The uterus is anteverted and grossly unremarkable. No adnexal masses. Small ovarian follicles noted. Other: None Musculoskeletal: No acute or significant osseous findings. IMPRESSION: 1. Multiple top-normal right lower quadrant lymph nodes  may be reactive or represent mesenteric adenitis. 2. No bowel obstruction. Normal appendix. Electronically Signed   By: Anner Crete M.D.   On: 11/22/2021 02:16    Procedures Procedures   Medications Ordered in ED Medications - No data to display  ED Course  I have reviewed the triage vital signs and the nursing notes.  Pertinent labs & imaging results that were available during my care of the patient were reviewed by me and considered in my medical decision making (see chart for details).    MDM Rules/Calculators/A&P  Patient here with lower abdominal pain times about a week.  She reports associated nausea, but denies any vomiting.  No diarrhea.  Patient is afebrile.  Mild leukocytosis.  No significant electrolyte derangement.  Pregnancy test negative.  Urinalysis is not consistent with infection.  Patient is quite tender in the right lower quadrant, will check CT.  CT shows normal appendix, but is consistent with mesenteric lymphadenopathy.  Suspicious for mesenteric adenitis.  Will treat with anti-inflammatories and Zofran.  Patient advised to follow-up with PCP.  She understands agrees the plan.  She is stable for discharge. Final Clinical Impression(s) / ED Diagnoses Final diagnoses:  Mesenteric adenitis    Rx / DC Orders ED Discharge Orders     None        Montine Circle, PA-C 11/22/21 0231    Margette Fast, MD 11/22/21 [phone]

## 2021-11-22 NOTE — ED Notes (Signed)
Patient verbalizes understanding of discharge instructions. Prescriptions and follow-up care reviewed. Opportunity for questioning and answers were provided. Armband removed by staff, pt discharged from ED ambulatory.  

## 2022-01-11 ENCOUNTER — Ambulatory Visit: Payer: Medicaid Other | Admitting: Obstetrics and Gynecology

## 2022-01-11 ENCOUNTER — Other Ambulatory Visit (HOSPITAL_COMMUNITY)
Admission: RE | Admit: 2022-01-11 | Discharge: 2022-01-11 | Disposition: A | Discharge status: Home / Self Care | Payer: Medicaid Other | Source: Ambulatory Visit | Attending: Obstetrics and Gynecology | Admitting: Obstetrics and Gynecology

## 2022-01-11 ENCOUNTER — Encounter: Payer: Self-pay | Admitting: Obstetrics and Gynecology

## 2022-01-11 ENCOUNTER — Other Ambulatory Visit: Payer: Self-pay

## 2022-01-11 VITALS — BP 116/78 | HR 96 | Ht 67.0 in | Wt 144.0 lb

## 2022-01-11 DIAGNOSIS — N938 Other specified abnormal uterine and vaginal bleeding: Secondary | ICD-10-CM | POA: Insufficient documentation

## 2022-01-11 DIAGNOSIS — R1030 Lower abdominal pain, unspecified: Secondary | ICD-10-CM

## 2022-01-11 DIAGNOSIS — Z202 Contact with and (suspected) exposure to infections with a predominantly sexual mode of transmission: Secondary | ICD-10-CM | POA: Insufficient documentation

## 2022-01-11 LAB — POCT URINALYSIS DIPSTICK
Bilirubin, UA: NEGATIVE
Blood, UA: NEGATIVE
Glucose, UA: NEGATIVE
Ketones, UA: NEGATIVE
Nitrite, UA: NEGATIVE
Odor: NEGATIVE
Protein, UA: NEGATIVE
Spec Grav, UA: 1.01 (ref 1.010–1.025)
Urobilinogen, UA: 0.2 E.U./dL
pH, UA: 7 (ref 5.0–8.0)

## 2022-01-11 MED ORDER — PROGESTERONE 200 MG PO CAPS: ORAL_CAPSULE | ORAL | 3 refills | Status: DC

## 2022-01-11 NOTE — Progress Notes (Signed)
Pt reports bilateral low abdominal pain x few months and recurrent BV infections.  Pt requests all STD testing.  Normal pap 04/25/21. Pt declines BC at this time.

## 2022-01-11 NOTE — Progress Notes (Signed)
Andrea Archer presents with c/o vaginal discharge and lower abd pain and cycle since stopping Depo provera in March. GYN U/S in April normal Sexual active without problems Denies any bowel or bladder dysfunction   PE AF VSS Lungs clear Heart RRR Abd soft + BS  A/P Vaginal discharge         DUB  Self swab collected today. Suspect DUB related to Depo Provera. Discussed Tx options. Will cycle with Prometrium. F/U per test results o/w in 3-4 months to gage response to Prometrium.

## 2022-01-12 LAB — CERVICOVAGINAL ANCILLARY ONLY
Bacterial Vaginitis (gardnerella): POSITIVE — AB
Candida Glabrata: NEGATIVE
Candida Vaginitis: NEGATIVE
Chlamydia: NEGATIVE
Comment: NEGATIVE
Comment: NEGATIVE
Comment: NEGATIVE
Comment: NEGATIVE
Comment: NEGATIVE
Comment: NORMAL
Neisseria Gonorrhea: POSITIVE — AB
Trichomonas: NEGATIVE

## 2022-01-12 LAB — TSH: TSH: 2.62 u[IU]/mL (ref 0.450–4.500)

## 2022-01-12 LAB — CBC
Hematocrit: 41.8 % (ref 34.0–46.6)
Hemoglobin: 13.8 g/dL (ref 11.1–15.9)
MCH: 28.2 pg (ref 26.6–33.0)
MCHC: 33 g/dL (ref 31.5–35.7)
MCV: 85 fL (ref 79–97)
Platelets: 264 10*3/uL (ref 150–450)
RBC: 4.9 x10E6/uL (ref 3.77–5.28)
RDW: 13.6 % (ref 11.7–15.4)
WBC: 7.7 10*3/uL (ref 3.4–10.8)

## 2022-01-12 LAB — HEPATITIS C ANTIBODY: Hep C Virus Ab: <0.1 s/co ratio (ref 0.0–0.9)

## 2022-01-12 LAB — HEPATITIS B SURFACE ANTIGEN: Hepatitis B Surface Ag: NEGATIVE

## 2022-01-12 LAB — HIV ANTIBODY (ROUTINE TESTING W REFLEX): HIV Screen 4th Generation wRfx: NONREACTIVE

## 2022-01-12 LAB — RPR: RPR Ser Ql: NONREACTIVE

## 2022-01-13 ENCOUNTER — Encounter: Payer: Self-pay | Admitting: Emergency Medicine

## 2022-01-13 ENCOUNTER — Ambulatory Visit
Admission: EM | Admit: 2022-01-13 | Discharge: 2022-01-13 | Disposition: A | Discharge status: Home / Self Care | Payer: Medicaid Other | Attending: Internal Medicine | Admitting: Internal Medicine

## 2022-01-13 ENCOUNTER — Other Ambulatory Visit: Payer: Self-pay

## 2022-01-13 DIAGNOSIS — N898 Other specified noninflammatory disorders of vagina: Secondary | ICD-10-CM | POA: Insufficient documentation

## 2022-01-13 DIAGNOSIS — Z113 Encounter for screening for infections with a predominantly sexual mode of transmission: Secondary | ICD-10-CM | POA: Insufficient documentation

## 2022-01-13 LAB — POCT URINE PREGNANCY: Preg Test, Ur: NEGATIVE

## 2022-01-13 LAB — URINE CULTURE

## 2022-01-13 MED ORDER — CEFTRIAXONE SODIUM 1 G IJ SOLR
0.5000 g | Freq: Once | INTRAMUSCULAR | Status: AC
  Administered 2022-01-13: 0.5 g via INTRAMUSCULAR

## 2022-01-13 MED ORDER — METRONIDAZOLE 500 MG PO TABS: 500.0000 mg | ORAL_TABLET | Freq: Two times a day (BID) | ORAL | 0 refills | Status: DC

## 2022-01-13 NOTE — ED Provider Notes (Signed)
[facility]    CSN: [id number] Arrival date & time: 01/13/22  1126      History   Chief Complaint Chief Complaint  Patient presents with   Vaginitis    HPI Andrea Archer is a 21 y.o. female.   Patient presents for further evaluation of vaginal discharge that has been present for approximately 1 to 2 weeks.  She was seen at her gynecologist approximately 2 days ago and had a cervicovaginal swab that was positive for gonorrhea and bacterial vaginosis.  Although, patient is wanting a second opinion as she was called by nursing staff and told that all of her tests were negative.  She has not yet received treatment for either gonorrhea or bacterial vaginosis.  Symptoms are still present.  She denies pelvic pain, abdominal pain, fever, back pain, irregular vaginal bleeding urinary burning, urinary frequency.  She is not sure of last known menstrual cycle as she received Depo previously and her menstrual cycles have been irregular.  Patient is requesting retesting for STDs.    Past Medical History:  Diagnosis Date   Anxiety    Depression    Hypertension    Pregnant    Sickle cell trait [facility])     Patient Active Problem List   Diagnosis Date Noted   DUB (dysfunctional uterine bleeding) 01/11/2022   Exposure to STD 01/11/2022   History of gestational hypertension 12/21/2019   Sickle cell trait ([place]) 05/04/2019   Change in bowel habits 06/18/2018    Past Surgical History:  Procedure Laterality Date   FINGER SURGERY Left    Pinky Finger   FRACTURE SURGERY  01/09/2019   left pinky finger    OB History     Gravida  1   Para  1   Term  1   Preterm  0   AB  0   Living  1      SAB  0   IAB  0   Ectopic  0   Multiple  0   Live Births  1            Home Medications    Prior to Admission medications   Medication Sig Start Date End Date Taking? Authorizing Provider  metroNIDAZOLE (FLAGYL) 500 MG tablet Take 1 tablet (500 mg total) by mouth 2  (two) times daily. 01/13/22  Yes , Michele Rockers, FNP  progesterone (PROMETRIUM) 200 MG capsule Take 1 capsule orally first 10 days of each month 01/11/22  Yes Chancy Milroy, MD    Family History Family History  Problem Relation Age of Onset   Diabetes Maternal Grandmother    Hypertension Maternal Grandmother    Heart disease Maternal Grandmother    Fibromyalgia Maternal Grandmother    Pancreatic cancer Maternal Grandfather    Hypertension Mother    Sickle cell anemia Father    Colon cancer Neg Hx    Stomach cancer Neg Hx     Social History Social History   Tobacco Use   Smoking status: Former    Types: Cigars   Smokeless tobacco: Never  Vaping Use   Vaping Use: Never used  Substance Use Topics   Alcohol use: Yes    Comment: Occ, more recent due to stress   Drug use: No     Allergies   Mushroom extract complex and Pineapple   Review of Systems Review of Systems Per HPI  Physical Exam Triage Vital Signs ED Triage Vitals  Enc Vitals Group  BP 01/13/22 1202 117/81     Pulse Rate 01/13/22 1202 81     Resp 01/13/22 1202 18     Temp 01/13/22 1202 98.6 F (37 C)     Temp Source 01/13/22 1202 Oral     SpO2 01/13/22 1202 98 %     Weight 01/13/22 1203 143 lb 15.4 oz (65.3 kg)     Height 01/13/22 1203 5\' 7"  (1.702 m)     Head Circumference --      Peak Flow --      Pain Score 01/13/22 1203 5     Pain Loc --      Pain Edu? --      Excl. in [place]? --    No data found.  Updated Vital Signs BP 117/81 (BP Location: Right Arm)    Pulse 81    Temp 98.6 F (37 C) (Oral)    Resp 18    Ht 5\' 7"  (1.702 m)    Wt 143 lb 15.4 oz (65.3 kg)    LMP 12/09/2021    SpO2 98%    Breastfeeding No    BMI 22.55 kg/m   Visual Acuity Right Eye Distance:   Left Eye Distance:   Bilateral Distance:    Right Eye Near:   Left Eye Near:    Bilateral Near:     Physical Exam Constitutional:      General: She is not in acute distress.    Appearance: Normal appearance. She is not  toxic-appearing or diaphoretic.  HENT:     Head: Normocephalic and atraumatic.  Eyes:     Extraocular Movements: Extraocular movements intact.     Conjunctiva/sclera: Conjunctivae normal.  Pulmonary:     Effort: Pulmonary effort is normal.  Genitourinary:    Comments: Deferred with shared decision making.  Self swab performed. Neurological:     General: No focal deficit present.     Mental Status: She is alert and oriented to person, place, and time. Mental status is at baseline.  Psychiatric:        Mood and Affect: Mood normal.        Behavior: Behavior normal.        Thought Content: Thought content normal.        Judgment: Judgment normal.     UC Treatments / Results  Labs (all labs ordered are listed, but only abnormal results are displayed) Labs Reviewed  POCT URINE PREGNANCY  CERVICOVAGINAL ANCILLARY ONLY    EKG   Radiology No results found.  Procedures Procedures (including critical care time)  Medications Ordered in UC Medications  cefTRIAXone (ROCEPHIN) injection 0.5 g (0.5 g Intramuscular Given 01/13/22 1326)    Initial Impression / Assessment and Plan / UC Course  I have reviewed the triage vital signs and the nursing notes.  Pertinent labs & imaging results that were available during my care of the patient were reviewed by me and considered in my medical decision making (see chart for details).     Review of patient's chart shows a recent cervicovaginal swab that was positive for gonorrhea and bacterial vaginosis.  Will opt to treat for gonorrhea with Rocephin injection in urgent care today as well as bacterial vaginosis with metronidazole for 7 days.  Cervicovaginal swab pending for retest per patient request.  Discussed return precautions.  Patient verbalized understanding and was agreeable with plan. Final Clinical Impressions(s) / UC Diagnoses   Final diagnoses:  Vaginal discharge  Screening examination for venereal disease  Discharge  Instructions      Your urine pregnancy was negative.  Your vaginal swab is pending.  We will call if it is positive.  Please refrain from sexual activity until test results and treatment are complete.  You were given an antibiotic injection in urgent care today to treat possible gonorrhea as well as an antibiotic sent to pharmacy to treat possible bacterial vaginosis.    ED Prescriptions     Medication Sig Dispense Auth. Provider   metroNIDAZOLE (FLAGYL) 500 MG tablet Take 1 tablet (500 mg total) by mouth 2 (two) times daily. 14 tablet [place], Michele Rockers, [place]      PDMP not reviewed this encounter.   Teodora Medici, [place] 01/13/22 1329

## 2022-01-13 NOTE — Discharge Instructions (Signed)
Your urine pregnancy was negative.  Your vaginal swab is pending.  We will call if it is positive.  Please refrain from sexual activity until test results and treatment are complete.  You were given an antibiotic injection in urgent care today to treat possible gonorrhea as well as an antibiotic sent to pharmacy to treat possible bacterial vaginosis.

## 2022-01-13 NOTE — ED Triage Notes (Signed)
Patient recently had STI testing and her GC came back positive.  Patient doesn't think the test is correct and has not received treatment.  When patient received a call from doctor was advised that everything was negative.  Patient requesting retesting.

## 2022-01-15 ENCOUNTER — Telehealth: Payer: Self-pay

## 2022-01-15 LAB — CERVICOVAGINAL ANCILLARY ONLY
Bacterial Vaginitis (gardnerella): POSITIVE — AB
Candida Glabrata: NEGATIVE
Candida Vaginitis: NEGATIVE
Chlamydia: NEGATIVE
Comment: NEGATIVE
Comment: NEGATIVE
Comment: NEGATIVE
Comment: NEGATIVE
Comment: NEGATIVE
Comment: NORMAL
Neisseria Gonorrhea: POSITIVE — AB
Trichomonas: NEGATIVE

## 2022-01-15 NOTE — Telephone Encounter (Signed)
S/w patient and advised of +GC and BV results. Pt states that she went to the hospital on 01-13-22 to be retested due being unsure if the results were accurate. Pt was treated for GC at the hospital, metronidazole was sent to pharmacy.

## 2022-01-16 ENCOUNTER — Encounter: Payer: Self-pay | Admitting: Nurse Practitioner

## 2022-01-16 ENCOUNTER — Ambulatory Visit: Payer: Medicaid Other | Admitting: Nurse Practitioner

## 2022-01-16 VITALS — BP 108/70 | HR 80 | Ht 67.0 in | Wt 142.4 lb

## 2022-01-16 DIAGNOSIS — R1030 Lower abdominal pain, unspecified: Secondary | ICD-10-CM

## 2022-01-16 DIAGNOSIS — R1013 Epigastric pain: Secondary | ICD-10-CM | POA: Diagnosis not present

## 2022-01-16 MED ORDER — DICYCLOMINE HCL 10 MG PO CAPS: 10.0000 mg | ORAL_CAPSULE | Freq: Three times a day (TID) | ORAL | 1 refills | Status: DC | PRN

## 2022-01-16 MED ORDER — NA SULFATE-K SULFATE-MG SULF 17.5-3.13-1.6 GM/177ML PO SOLN: 1.0000 | ORAL | 0 refills | Status: DC

## 2022-01-16 MED ORDER — OMEPRAZOLE 20 MG PO CPDR: 20.0000 mg | DELAYED_RELEASE_CAPSULE | Freq: Every day | ORAL | 1 refills | Status: DC

## 2022-01-16 NOTE — Progress Notes (Signed)
01/16/2022 Andrea Archer [id number] 07/09/2001   Chief Complaint: Lower abdominal pain, schedule colonoscopy  History of Present Illness: Andrea Archer is a 21 year old female with a past medical history of anxiety, depression, hypertension, sickle cell trait and chronic lower abdominal pain.  She was initially seen by Dr. Silverio Decamp 10/10/2021 due to having right and left lower abdominal pain x 1 year with intermittent rectal bleeding.  She also noted having intermittent heartburn without dysphagia.  She was prescribed omeprazole 20 mg daily which he did not initiate.  A colonoscopy was ordered but was not scheduled.  She presents her office today to schedule a colonoscopy.  She continues to have lower abdominal pain, RLQ > LLQ.  Her lower abdominal pain is fairly constant.  She continues to have a variable bowel pattern.  She passes a normal solid brown stool some days and other days passes bits of solid stool with water.  She occasionally sees bright red blood on the toilet tissue and mixed in the stool.  She last saw blood on the stool towards the end of October 2022.  No known family history of IBD.  Grandmother with history of colorectal cancer.  At this time, she denies having any heartburn but has intermittent epigastric pain and sometimes feels as if food is slow to pass down the esophagus which started early December 2022.  Food does not get stuck in the esophagus.  She is not taking any acid reducing medications.  Infrequent NSAID use.  Was recently prescribed metronidazole for bacterial vaginosis.  Last menstrual cycle was 12/09/2021.  She reports having irregular menses since Depo-Provera implant was removed.  CBC Latest Ref Rng & Units 01/11/2022 11/21/2021 03/28/2021  WBC 3.4 - 10.8 x10E3/uL 7.7 11.5(H) 8.4  Hemoglobin 11.1 - 15.9 g/dL 13.8 12.5 13.9  Hematocrit 34.0 - 46.6 % 41.8 37.6 42.6  Platelets 150 - 450 x10E3/uL 264 309 330    CMP Latest Ref Rng & Units 11/21/2021 03/28/2021  07/24/2020  Glucose 70 - 99 mg/dL 75 63(L) 77  BUN 6 - 20 mg/dL 8 14 <5(L)  Creatinine 0.44 - 1.00 mg/dL 0.91 1.21(H) 0.93  Sodium 135 - 145 mmol/L 134(L) 144 139  Potassium 3.5 - 5.1 mmol/L 3.8 4.5 3.9  Chloride 98 - 111 mmol/L 103 108(H) 107  CO2 22 - 32 mmol/L 23 21 24   Calcium 8.9 - 10.3 mg/dL 8.9 9.6 9.4  Total Protein 6.5 - 8.1 g/dL 6.2(L) 7.4 6.5  Total Bilirubin 0.3 - 1.2 mg/dL 0.6 0.5 1.0  Alkaline Phos 38 - 126 U/L 59 70 77  AST 15 - 41 U/L 16 17 14(L)  ALT 0 - 44 U/L 10 8 11     Current Outpatient Medications on File Prior to Visit  Medication Sig Dispense Refill   metroNIDAZOLE (FLAGYL) 500 MG tablet Take 1 tablet (500 mg total) by mouth 2 (two) times daily. (Patient not taking: Reported on 01/16/2022) 14 tablet 0   progesterone (PROMETRIUM) 200 MG capsule Take 1 capsule orally first 10 days of each month (Patient not taking: Reported on 01/16/2022) 30 capsule 3   No current facility-administered medications on file prior to visit.   Allergies  Allergen Reactions   Mushroom Extract Complex Rash   Pineapple Rash   Current Medications, Allergies, Past Medical History, Past Surgical History, Family History and Social History were reviewed in [employer] record.  Review of Systems:   Constitutional: Negative for fever, sweats, chills or weight loss.  Respiratory: Negative  for shortness of breath.   Cardiovascular: Negative for chest pain, palpitations and leg swelling.  Gastrointestinal: See HPI.  Musculoskeletal: Negative for back pain or muscle aches.  Neurological: Negative for dizziness, headaches or paresthesias.    Physical Exam: BP 108/70    Pulse 80    Ht 5\' 7"  (1.702 m)    Wt 142 lb 6.4 oz (64.6 kg)    LMP 12/09/2021    BMI 22.30 kg/m  General: 21 year old female in no acute distress. Head: Normocephalic and atraumatic. Eyes: No scleral icterus. Conjunctiva pink . Ears: Normal auditory acuity. Mouth: Dentition intact. No ulcers or  lesions.  Lungs: Clear throughout to auscultation. Heart: Regular rate and rhythm, no murmur. Abdomen: Soft, nondistended.  Mild lower abdominal tenderness without rebound or guarding.  No masses or hepatomegaly. Normal bowel sounds x 4 quadrants.  Rectal: Deferred. Musculoskeletal: Symmetrical with no gross deformities. Extremities: No edema. Neurological: Alert oriented x 4. No focal deficits.  Psychological: Alert and cooperative. Normal mood and affect  Assessment and Recommendations:  28) 21 year old female with chronic lower abdominal pain with intermittent rectal bleeding for the past year.  Grandmother with history of colon cancer. -Colonoscopy as previously ordered by Dr. Silverio Decamp -Dicyclomine 10 mg 1 p.o. 3 times daily as needed abdominal pain -Beta hCG level to 4 days prior to colonoscopy date (history of irregular menstrual cycle)  2) Epigastric pain, questionable esophageal dysmotility -Patient declines EGD at this time -Omeprazole 20 mg 1 p.o. daily -GERD diet -EGD and barium swallow study if symptoms persist or worsen  Further recommendations to be determined after the above evaluation completed

## 2022-01-16 NOTE — Patient Instructions (Signed)
It was great seeing you today! Thank you for entrusting me with your care and choosing [facility].  Noralyn Pick, CRNP  MEDICATION: We have sent the following medication to your pharmacy for you to pick up at your convenience: Omeprazole 20 mg once a day. Dicyclomine 10 MG 3 times a day as needed for abdominal pain. Go to the lab 3-4 days before the colonoscopy for a pregnancy test. Follow GERD information.  Gastroesophageal Reflux Disease, Adult Gastroesophageal reflux (GER) happens when acid from the stomach flows up into the tube that connects the mouth and the stomach (esophagus). Normally, food travels down the esophagus and stays in the stomach to be digested. With GER, food and stomach acid sometimes move back up into the esophagus. You may have a disease called gastroesophageal reflux disease (GERD) if the reflux: Happens often. Causes frequent or very bad symptoms. Causes problems such as damage to the esophagus. When this happens, the esophagus becomes sore and swollen. Over time, GERD can make small holes (ulcers) in the lining of the esophagus. What are the causes? This condition is caused by a problem with the muscle between the esophagus and the stomach. When this muscle is weak or not normal, it does not close properly to keep food and acid from coming back up from the stomach. The muscle can be weak because of: Tobacco use. Pregnancy. Having a certain type of hernia (hiatal hernia). Alcohol use. Certain foods and drinks, such as coffee, chocolate, onions, and peppermint. What increases the risk? Being overweight. Having a disease that affects your connective tissue. Taking NSAIDs, such a ibuprofen. What are the signs or symptoms? Heartburn. Difficult or painful swallowing. The feeling of having a lump in the throat. A bitter taste in the mouth. Bad breath. Having a lot of saliva. Having an upset or bloated stomach. Burping. Chest pain.  Different conditions can cause chest pain. Make sure you see your doctor if you have chest pain. Shortness of breath or wheezing. A long-term cough or a cough at night. Wearing away of the surface of teeth (tooth enamel). Weight loss. How is this treated? Making changes to your diet. Taking medicine. Having surgery. Treatment will depend on how bad your symptoms are. Follow these instructions at home: Eating and drinking  Follow a diet as told by your doctor. You may need to avoid foods and drinks such as: Coffee and tea, with or without caffeine. Drinks that contain alcohol. Energy drinks and sports drinks. Bubbly (carbonated) drinks or sodas. Chocolate and cocoa. Peppermint and mint flavorings. Garlic and onions. Horseradish. Spicy and acidic foods. These include peppers, chili powder, curry powder, vinegar, hot sauces, and BBQ sauce. Citrus fruit juices and citrus fruits, such as oranges, lemons, and limes. Tomato-based foods. These include red sauce, chili, salsa, and pizza with red sauce. Fried and fatty foods. These include donuts, french fries, potato chips, and high-fat dressings. High-fat meats. These include hot dogs, rib eye steak, sausage, ham, and bacon. High-fat dairy items, such as whole milk, butter, and cream cheese. Eat small meals often. Avoid eating large meals. Avoid drinking large amounts of liquid with your meals. Avoid eating meals during the 2-3 hours before bedtime. Avoid lying down right after you eat. Do not exercise right after you eat. Lifestyle  Do not smoke or use any products that contain nicotine or tobacco. If you need help quitting, ask your doctor. Try to lower your stress. If you need help doing this, ask your doctor. If  you are overweight, lose an amount of weight that is healthy for you. Ask your doctor about a safe weight loss goal. General instructions Pay attention to any changes in your symptoms. Take over-the-counter and  prescription medicines only as told by your doctor. Do not take aspirin, ibuprofen, or other NSAIDs unless your doctor says it is okay. Wear loose clothes. Do not wear anything tight around your waist. Raise (elevate) the head of your bed about 6 inches (15 cm). You may need to use a wedge to do this. Avoid bending over if this makes your symptoms worse. Keep all follow-up visits. Contact a doctor if: You have new symptoms. You lose weight and you do not know why. You have trouble swallowing or it hurts to swallow. You have wheezing or a cough that keeps happening. You have a hoarse voice. Your symptoms do not get better with treatment. Get help right away if: You have sudden pain in your arms, neck, jaw, teeth, or back. You suddenly feel sweaty, dizzy, or light-headed. You have chest pain or shortness of breath. You vomit and the vomit is green, yellow, or black, or it looks like blood or coffee grounds. You faint. Your poop (stool) is red, bloody, or black. You cannot swallow, drink, or eat. These symptoms may represent a serious problem that is an emergency. Do not wait to see if the symptoms will go away. Get medical help right away. Call your local emergency services (911 in the [place] Do not drive yourself to the hospital. Summary If a person has gastroesophageal reflux disease (GERD), food and stomach acid move back up into the esophagus and cause symptoms or problems such as damage to the esophagus. Treatment will depend on how bad your symptoms are. Follow a diet as told by your doctor. Take all medicines only as told by your doctor. This information is not intended to replace advice given to you by your health care provider. Make sure you discuss any questions you have with your health care provider. Document Revised: 06/27/2020 Document Reviewed: 06/27/2020 Elsevier Patient Education  2022 [place] providers would like to encourage you to use [facility] to  communicate with providers for non-urgent requests or questions.  Due to long hold times on the telephone, sending your provider a message by [facility] may be faster and more efficient way to get a response. Please allow 48 business hours for a response.  Please remember that this is for non-urgent requests/questions. If you are age 22 or older, your body mass index should be between 23-30. Your Body mass index is 22.3 kg/m. If this is out of the aforementioned range listed, please consider follow up with your Primary Care Provider.  If you are age 15 or younger, your body mass index should be between 19-25. Your Body mass index is 22.3 kg/m. If this is out of the aformentioned range listed, please consider follow up with your Primary Care Provider.

## 2022-01-23 NOTE — Progress Notes (Signed)
Reviewed and agree with documentation and assessment and plan. K. Veena Emiah Pellicano , MD   

## 2022-02-08 ENCOUNTER — Encounter: Payer: Self-pay | Admitting: Obstetrics

## 2022-02-08 ENCOUNTER — Other Ambulatory Visit (HOSPITAL_COMMUNITY)
Admission: RE | Admit: 2022-02-08 | Discharge: 2022-02-08 | Disposition: A | Discharge status: Home / Self Care | Payer: Medicaid Other | Source: Ambulatory Visit | Attending: Obstetrics | Admitting: Obstetrics

## 2022-02-08 ENCOUNTER — Other Ambulatory Visit: Payer: Self-pay

## 2022-02-08 ENCOUNTER — Ambulatory Visit (INDEPENDENT_AMBULATORY_CARE_PROVIDER_SITE_OTHER): Payer: Medicaid Other | Admitting: Obstetrics

## 2022-02-08 VITALS — BP 112/71 | HR 87 | Ht 67.0 in | Wt 141.6 lb

## 2022-02-08 DIAGNOSIS — N898 Other specified noninflammatory disorders of vagina: Secondary | ICD-10-CM | POA: Diagnosis not present

## 2022-02-08 DIAGNOSIS — Z113 Encounter for screening for infections with a predominantly sexual mode of transmission: Secondary | ICD-10-CM | POA: Diagnosis not present

## 2022-02-08 DIAGNOSIS — Z3202 Encounter for pregnancy test, result negative: Secondary | ICD-10-CM

## 2022-02-08 LAB — POCT URINE PREGNANCY: Preg Test, Ur: NEGATIVE

## 2022-02-08 NOTE — Progress Notes (Signed)
Patient presents for STD screen. Reports new partner. Recent tx for gonorrhea. Desires bloodwork and UPT. PHQ9: 16.

## 2022-02-08 NOTE — Progress Notes (Signed)
Patient ID: Andrea Archer, female   DOB: [birth year], 21 y.o.   MRN: [id number]  Chief Complaint  Patient presents with   Gynecologic Exam    HPI Andrea Archer is a 21 y.o. female.  Patient has new partner, has vaginal discharge,  and requests a STD screen.   HPI  Past Medical History:  Diagnosis Date   Anxiety    Depression    Hypertension    Pregnant    Sickle cell trait ([place])     Past Surgical History:  Procedure Laterality Date   FINGER SURGERY Left    Pinky Finger   FRACTURE SURGERY  01/09/2019   left pinky finger    Family History  Problem Relation Age of Onset   Hypertension Paternal Grandfather    Hypertension Paternal Grandmother    Diabetes Maternal Grandmother    Hypertension Maternal Grandmother    Heart disease Maternal Grandmother    Fibromyalgia Maternal Grandmother    Hypertension Maternal Grandfather    Pancreatic cancer Maternal Grandfather    Sickle cell anemia Father    Hypertension Mother    Colon cancer Neg Hx    Stomach cancer Neg Hx     Social History Social History   Tobacco Use   Smoking status: Former    Types: Cigars   Smokeless tobacco: Never  Vaping Use   Vaping Use: Never used  Substance Use Topics   Alcohol use: Yes    Comment: Occ, more recent due to stress   Drug use: No    Allergies  Allergen Reactions   Mushroom Extract Complex Rash   Pineapple Rash    Current Outpatient Medications  Medication Sig Dispense Refill   dicyclomine (BENTYL) 10 MG capsule Take 1 capsule (10 mg total) by mouth 3 (three) times daily as needed for spasms (abdominal pain). (Patient not taking: Reported on 02/08/2022) 30 capsule 1   metroNIDAZOLE (FLAGYL) 500 MG tablet Take 1 tablet (500 mg total) by mouth 2 (two) times daily. (Patient not taking: Reported on 01/16/2022) 14 tablet 0   Na Sulfate-K Sulfate-Mg Sulf (SUPREP BOWEL PREP KIT) 17.5-3.13-1.6 GM/177ML SOLN Take 1 kit by mouth as directed. For colonoscopy prep (Patient not taking: Reported  on 02/08/2022) 354 mL 0   omeprazole (PRILOSEC) 20 MG capsule Take 1 capsule (20 mg total) by mouth daily. (Patient not taking: Reported on 02/08/2022) 30 capsule 1   progesterone (PROMETRIUM) 200 MG capsule Take 1 capsule orally first 10 days of each month (Patient not taking: Reported on 01/16/2022) 30 capsule 3   No current facility-administered medications for this visit.    Review of Systems Review of Systems Constitutional: negative for fatigue and weight loss Respiratory: negative for cough and wheezing Cardiovascular: negative for chest pain, fatigue and palpitations Gastrointestinal: negative for abdominal pain and change in bowel habits Genitourinary: positive for vaginal discharge Integument/breast: negative for nipple discharge Musculoskeletal:negative for myalgias Neurological: negative for gait problems and tremors Behavioral/Psych: negative for abusive relationship, depression Endocrine: negative for temperature intolerance      Blood pressure 112/71, pulse 87, height _0  (1.702 m), weight 141 lb 9.6 oz (64.2 kg), last menstrual period 01/09/2022.  Physical Exam Physical Exam General:   Alert and no distress  Skin:   no rash or abnormalities  Lungs:   clear to auscultation bilaterally  Heart:   regular rate and rhythm, S1, S2 normal, no murmur, click, rub or gallop  Breasts:   Not examined  Abdomen:  normal findings: no organomegaly, soft,  non-tender and no hernia  Pelvis:  External genitalia: normal general appearance Urinary system: urethral meatus normal and bladder without fullness, nontender Vaginal: normal without tenderness, induration or masses Cervix: normal appearance Adnexa: normal bimanual exam Uterus: anteverted and non-tender, normal size    I have spent a total of 20 minutes of face-to-face time, excluding clinical staff time, reviewing notes and preparing to see patient, ordering tests and/or medications, and counseling the patient.   Data  Reviewed Wet Prep  Assessment     1. Vaginal discharge Rx: - Cervicovaginal ancillary only( [place]) - POCT urine pregnancy  2. Screening for STD (sexually transmitted disease) Rx: - Hepatitis B surface antigen - HIV Antibody (routine testing w rflx) - Hepatitis C antibody  - RPR   3. Pregnancy test negative      Plan  Follow up in 1 week   Orders Placed This Encounter  Procedures   Hepatitis B surface antigen   HIV Antibody (routine testing w rflx)   Hepatitis C antibody   RPR   POCT urine pregnancy     Shelly Bombard, MD 02/08/2022 3:57 PM

## 2022-02-09 LAB — CERVICOVAGINAL ANCILLARY ONLY
Bacterial Vaginitis (gardnerella): POSITIVE — AB
Candida Glabrata: NEGATIVE
Candida Vaginitis: POSITIVE — AB
Chlamydia: NEGATIVE
Comment: NEGATIVE
Comment: NEGATIVE
Comment: NEGATIVE
Comment: NEGATIVE
Comment: NEGATIVE
Comment: NORMAL
Neisseria Gonorrhea: NEGATIVE
Trichomonas: NEGATIVE

## 2022-02-09 LAB — RPR: RPR Ser Ql: NONREACTIVE

## 2022-02-09 LAB — HEPATITIS B SURFACE ANTIGEN: Hepatitis B Surface Ag: NEGATIVE

## 2022-02-09 LAB — HEPATITIS C ANTIBODY: Hep C Virus Ab: <0.1 s/co ratio (ref 0.0–0.9)

## 2022-02-09 LAB — HIV ANTIBODY (ROUTINE TESTING W REFLEX): HIV Screen 4th Generation wRfx: NONREACTIVE

## 2022-02-10 ENCOUNTER — Other Ambulatory Visit: Payer: Self-pay | Admitting: Obstetrics

## 2022-02-10 DIAGNOSIS — B379 Candidiasis, unspecified: Secondary | ICD-10-CM

## 2022-02-10 MED ORDER — FLUCONAZOLE 150 MG PO TABS: 150.0000 mg | ORAL_TABLET | Freq: Once | ORAL | 0 refills | Status: AC

## 2022-02-12 ENCOUNTER — Telehealth: Payer: Self-pay

## 2022-02-12 MED ORDER — METRONIDAZOLE 500 MG PO TABS: 500.0000 mg | ORAL_TABLET | Freq: Two times a day (BID) | ORAL | 0 refills | Status: DC

## 2022-02-12 MED ORDER — FLUCONAZOLE 150 MG PO TABS: 150.0000 mg | ORAL_TABLET | Freq: Once | ORAL | 0 refills | Status: AC

## 2022-02-12 NOTE — Telephone Encounter (Signed)
S/w patient and advised of results and rx sent  ?

## 2022-02-15 ENCOUNTER — Other Ambulatory Visit: Payer: Self-pay

## 2022-02-15 MED ORDER — NA SULFATE-K SULFATE-MG SULF 17.5-3.13-1.6 GM/177ML PO SOLN
1.0000 | ORAL | 0 refills | Status: DC
Start: 2022-02-15 — End: 2022-02-21

## 2022-02-21 ENCOUNTER — Encounter: Payer: Self-pay | Admitting: Gastroenterology

## 2022-02-21 ENCOUNTER — Other Ambulatory Visit: Payer: Self-pay

## 2022-02-21 ENCOUNTER — Ambulatory Visit (AMBULATORY_SURGERY_CENTER): Payer: Medicaid Other | Admitting: Gastroenterology

## 2022-02-21 VITALS — BP 107/65 | HR 67 | Temp 97.4°F | Resp 18 | Ht 67.0 in | Wt 142.0 lb

## 2022-02-21 DIAGNOSIS — K5289 Other specified noninfective gastroenteritis and colitis: Secondary | ICD-10-CM

## 2022-02-21 DIAGNOSIS — K625 Hemorrhage of anus and rectum: Secondary | ICD-10-CM

## 2022-02-21 DIAGNOSIS — K633 Ulcer of intestine: Secondary | ICD-10-CM | POA: Diagnosis not present

## 2022-02-21 DIAGNOSIS — R1030 Lower abdominal pain, unspecified: Secondary | ICD-10-CM

## 2022-02-21 DIAGNOSIS — K5000 Crohn's disease of small intestine without complications: Secondary | ICD-10-CM

## 2022-02-21 DIAGNOSIS — D123 Benign neoplasm of transverse colon: Secondary | ICD-10-CM

## 2022-02-21 DIAGNOSIS — D122 Benign neoplasm of ascending colon: Secondary | ICD-10-CM

## 2022-02-21 MED ORDER — SODIUM CHLORIDE 0.9 % IV SOLN: 500.0000 mL | Freq: Once | INTRAVENOUS | Status: DC

## 2022-02-21 NOTE — Patient Instructions (Signed)
Please read handouts provided. Continue present medications. Await pathology results. Return to GI clinic at the next available appointment in 2 months. Please call to schedule appointment.   YOU HAD AN ENDOSCOPIC PROCEDURE TODAY AT [facility]:   Refer to the procedure report that was given to you for any specific questions about what was found during the examination.  If the procedure report does not answer your questions, please call your gastroenterologist to clarify.  If you requested that your care partner not be given the details of your procedure findings, then the procedure report has been included in a sealed envelope for you to review at your convenience later.  YOU SHOULD EXPECT: Some feelings of bloating in the abdomen. Passage of more gas than usual.  Walking can help get rid of the air that was put into your GI tract during the procedure and reduce the bloating. If you had a lower endoscopy (such as a colonoscopy or flexible sigmoidoscopy) you may notice spotting of blood in your stool or on the toilet paper. If you underwent a bowel prep for your procedure, you may not have a normal bowel movement for a few days.  Please Note:  You might notice some irritation and congestion in your nose or some drainage.  This is from the oxygen used during your procedure.  There is no need for concern and it should clear up in a day or so.  SYMPTOMS TO REPORT IMMEDIATELY:  Following lower endoscopy (colonoscopy or flexible sigmoidoscopy):  Excessive amounts of blood in the stool  Significant tenderness or worsening of abdominal pains  Swelling of the abdomen that is new, acute  Fever of 100F or higher   For urgent or emergent issues, a gastroenterologist can be reached at any hour by calling [phone]. Do not use MyChart messaging for urgent concerns.    DIET:  We do recommend a small meal at first, but then you may proceed to your regular diet.  Drink plenty of  fluids but you should avoid alcoholic beverages for 24 hours.  ACTIVITY:  You should plan to take it easy for the rest of today and you should NOT DRIVE or use heavy machinery until tomorrow (because of the sedation medicines used during the test).    FOLLOW UP: Our staff will call the number listed on your records 48-72 hours following your procedure to check on you and address any questions or concerns that you may have regarding the information given to you following your procedure. If we do not reach you, we will leave a message.  We will attempt to reach you two times.  During this call, we will ask if you have developed any symptoms of COVID 19. If you develop any symptoms (ie: fever, flu-like symptoms, shortness of breath, cough etc.) before then, please call [phone].  If you test positive for Covid 19 in the 2 weeks post procedure, please call and report this information to [place].    If any biopsies were taken you will be contacted by phone or by letter within the next 1-3 weeks.  Please call us at [phone] if you have not heard about the biopsies in 3 weeks.    SIGNATURES/CONFIDENTIALITY: You and/or your care partner have signed paperwork which will be entered into your electronic medical record.  These signatures attest to the fact that that the information above on your After Visit Summary has been reviewed and is understood.  Full responsibility of the confidentiality  of this discharge information lies with you and/or your care-partner.

## 2022-02-21 NOTE — Progress Notes (Signed)
Called to room to assist during endoscopic procedure.  Patient ID and intended procedure confirmed with present staff. Received instructions for my participation in the procedure from the performing physician.  

## 2022-02-21 NOTE — Progress Notes (Signed)
[facility] History and Physical   Primary Care Physician:  Pcp, No   Reason for Procedure:  Lower abdominal pain, rectal bleeding, exclude IBD  Plan:    Colonoscopy with possible interventions as needed     HPI: Andrea Archer is a very pleasant 21 y.o. female here for colonoscopy for further evaluation of lower abdominal pain, rectal bleeding. Will need to exclude IBD.  Grandmother had colon cancer.   The risks and benefits as well as alternatives of endoscopic procedure(s) have been discussed and reviewed. All questions answered. The patient agrees to proceed.    Past Medical History:  Diagnosis Date   Anxiety    Depression    Hypertension    Pregnant    Sickle cell trait [facility])     Past Surgical History:  Procedure Laterality Date   FINGER SURGERY Left    Pinky Finger   FRACTURE SURGERY  01/09/2019   left pinky finger    Prior to Admission medications   Medication Sig Start Date End Date Taking? Authorizing Provider  dicyclomine (BENTYL) 10 MG capsule Take 1 capsule (10 mg total) by mouth 3 (three) times daily as needed for spasms (abdominal pain). Patient not taking: Reported on 02/08/2022 01/16/22   Noralyn Pick, NP  metroNIDAZOLE (FLAGYL) 500 MG tablet Take 1 tablet (500 mg total) by mouth 2 (two) times daily. Patient not taking: Reported on 01/16/2022 01/13/22   Teodora Medici, FNP  metroNIDAZOLE (FLAGYL) 500 MG tablet Take 1 tablet (500 mg total) by mouth 2 (two) times daily. 02/12/22   Shelly Bombard, MD  Na Sulfate-K Sulfate-Mg Sulf (SUPREP BOWEL PREP KIT) 17.5-3.13-1.6 GM/177ML SOLN Take 1 kit by mouth as directed. For colonoscopy prep 02/15/22   Noralyn Pick, NP  omeprazole (PRILOSEC) 20 MG capsule Take 1 capsule (20 mg total) by mouth daily. Patient not taking: Reported on 02/08/2022 01/16/22   Noralyn Pick, NP  progesterone (PROMETRIUM) 200 MG capsule Take 1 capsule orally first 10 days of each month Patient not  taking: Reported on 01/16/2022 01/11/22   Chancy Milroy, MD    Current Outpatient Medications  Medication Sig Dispense Refill   dicyclomine (BENTYL) 10 MG capsule Take 1 capsule (10 mg total) by mouth 3 (three) times daily as needed for spasms (abdominal pain). (Patient not taking: Reported on 02/08/2022) 30 capsule 1   metroNIDAZOLE (FLAGYL) 500 MG tablet Take 1 tablet (500 mg total) by mouth 2 (two) times daily. (Patient not taking: Reported on 01/16/2022) 14 tablet 0   metroNIDAZOLE (FLAGYL) 500 MG tablet Take 1 tablet (500 mg total) by mouth 2 (two) times daily. 14 tablet 0   Na Sulfate-K Sulfate-Mg Sulf (SUPREP BOWEL PREP KIT) 17.5-3.13-1.6 GM/177ML SOLN Take 1 kit by mouth as directed. For colonoscopy prep 354 mL 0   omeprazole (PRILOSEC) 20 MG capsule Take 1 capsule (20 mg total) by mouth daily. (Patient not taking: Reported on 02/08/2022) 30 capsule 1   progesterone (PROMETRIUM) 200 MG capsule Take 1 capsule orally first 10 days of each month (Patient not taking: Reported on 01/16/2022) 30 capsule 3   Current Facility-Administered Medications  Medication Dose Route Frequency Provider Last Rate Last Admin   0.9 %  sodium chloride infusion  500 mL Intravenous Once Mauri Pole, MD        Allergies as of 02/21/2022 - Review Complete 02/08/2022  Allergen Reaction Noted   Mushroom extract complex Rash 11/16/2019   Pineapple Rash 11/16/2019    Family History  Problem Relation Age of Onset   Hypertension Paternal Grandfather    Hypertension Paternal Grandmother    Diabetes Maternal Grandmother    Hypertension Maternal Grandmother    Heart disease Maternal Grandmother    Fibromyalgia Maternal Grandmother    Hypertension Maternal Grandfather    Pancreatic cancer Maternal Grandfather    Sickle cell anemia Father    Hypertension Mother    Colon cancer Neg Hx    Stomach cancer Neg Hx     Social History   Socioeconomic History   Marital status: Significant Other    Spouse  name: Not on file   Number of children: Not on file   Years of education: Not on file   Highest education level: Not on file  Occupational History   Not on file  Tobacco Use   Smoking status: Former    Types: Cigars   Smokeless tobacco: Never  Vaping Use   Vaping Use: Never used  Substance and Sexual Activity   Alcohol use: Yes    Comment: Occ, more recent due to stress   Drug use: No   Sexual activity: Yes    Partners: Male    Birth control/protection: None  Other Topics Concern   Not on file  Social History Narrative   Not on file   Social Determinants of Health   Financial Resource Strain: Not on file  Food Insecurity: Not on file  Transportation Needs: Not on file  Physical Activity: Not on file  Stress: Not on file  Social Connections: Not on file  Intimate Partner Violence: Not on file    Review of Systems:  All other review of systems negative except as mentioned in the HPI.  Physical Exam: Vital signs in last 24 hours: BP (!) 124/93    Pulse 92    Temp (!) 97.4 F (36.3 C) (Skin)    Ht 5' 7"  (1.702 m)    Wt 142 lb (64.4 kg)    SpO2 100%    BMI 22.24 kg/m  General:   Alert, NAD Lungs:  Clear .   Heart:  Regular rate and rhythm Abdomen:  Soft, nontender and nondistended. Neuro/Psych:  Alert and cooperative. Normal mood and affect. A and O x 3  Reviewed labs, radiology imaging, old records and pertinent past GI work up  Patient is appropriate for planned procedure(s) and anesthesia in an ambulatory setting   K. Denzil Magnuson , MD [phone]     5

## 2022-02-21 NOTE — Progress Notes (Signed)
To Pacu, VSS. Report to [email or web address] 

## 2022-02-21 NOTE — Progress Notes (Signed)
VS- [facility]  Cell phone off per pt

## 2022-02-21 NOTE — Op Note (Signed)
[place] Patient Name: Andrea Archer Procedure Date: 02/21/2022 2:39 PM MRN: [id number] Endoscopist: Mauri Pole , MD Age: 21 Referring MD:  Date of Birth: 08-23-2001 Gender: Female Account #: [id number] Procedure:                Colonoscopy Indications:              Evaluation of unexplained GI bleeding presenting                            with Hematochezia, Obtain more precise diagnosis of                            inflammatory bowel disease, Lower abdominal pain Medicines:                Monitored Anesthesia Care Procedure:                Pre-Anesthesia Assessment:                           - Prior to the procedure, a History and Physical                            was performed, and patient medications and                            allergies were reviewed. The patient's tolerance of                            previous anesthesia was also reviewed. The risks                            and benefits of the procedure and the sedation                            options and risks were discussed with the patient.                            All questions were answered, and informed consent                            was obtained. Prior Anticoagulants: The patient has                            taken no previous anticoagulant or antiplatelet                            agents. ASA Grade Assessment: II - A patient with                            mild systemic disease. After reviewing the risks                            and benefits, the patient was deemed in  satisfactory condition to undergo the procedure.                           After obtaining informed consent, the colonoscope                            was passed under direct vision. Throughout the                            procedure, the patient's blood pressure, pulse, and                            oxygen saturations were monitored continuously. The                             PCF-HQ190L Colonoscope was introduced through the                            anus and advanced to the the cecum, identified by                            appendiceal orifice and ileocecal valve. The                            colonoscopy was performed without difficulty. The                            patient tolerated the procedure well. The quality                            of the bowel preparation was excellent. The                            terminal ileum, ileocecal valve, appendiceal                            orifice, and rectum were photographed. Scope In: 2:59:41 PM Scope Out: 3:15:28 PM Scope Withdrawal Time: 0 hours 11 minutes 11 seconds  Total Procedure Duration: 0 hours 15 minutes 47 seconds  Findings:                 The perianal and digital rectal examinations were                            normal.                           Normal mucosa was found in the entire colon.                            Biopsies were taken with a cold forceps for                            histology.  The terminal ileum contained a few patchy                            non-bleeding aphthae. Biopsies were taken with a                            cold forceps for histology.                           A 3 mm polyp was found in the ascending colon. The                            polyp was sessile. The polyp was removed with a                            cold snare. Resection and retrieval were complete.                           Non-bleeding external and internal hemorrhoids were                            found during retroflexion. The hemorrhoids were                            small. Complications:            No immediate complications. Estimated Blood Loss:     Estimated blood loss was minimal. Impression:               - Normal mucosa in the entire examined colon.                            Biopsied.                           - Aphtha in the terminal ileum. Biopsied.                            - One 3 mm polyp in the ascending colon, removed                            with a cold snare. Resected and retrieved.                           - Non-bleeding external and internal hemorrhoids. Recommendation:           - Patient has a contact number available for                            emergencies. The signs and symptoms of potential                            delayed complications were discussed with the                            patient.  Return to normal activities tomorrow.                            Written discharge instructions were provided to the                            patient.                           - Resume previous diet.                           - Continue present medications.                           - Await pathology results.                           - Repeat colonoscopy date to be determined after                            pending pathology results are reviewed for                            surveillance based on pathology results.                           - Return to GI clinic at the next available                            appointment in 2 months. Please call to schedule                            appointment. Mauri Pole, MD 02/21/2022 3:21:29 PM This report has been signed electronically.

## 2022-02-23 ENCOUNTER — Telehealth: Payer: Self-pay

## 2022-02-23 NOTE — Telephone Encounter (Signed)
°  Follow up Call-  Call back number 02/21/2022  Post procedure Call Back phone  # [phone]  Permission to leave phone message Yes  Some recent data might be hidden     Patient questions:  Do you have a fever, pain , or abdominal swelling? No. Pain Score  0 *  Have you tolerated food without any problems? Yes.    Have you been able to return to your normal activities? Yes.    Do you have any questions about your discharge instructions: Diet   No. Medications  No. Follow up visit  No.  Do you have questions or concerns about your Care? No.  Actions: * If pain score is 4 or above: No action needed, pain <4.

## 2022-02-28 ENCOUNTER — Encounter: Payer: Self-pay | Admitting: Gastroenterology

## 2022-02-28 ENCOUNTER — Telehealth: Payer: Self-pay | Admitting: Gastroenterology

## 2022-02-28 NOTE — Telephone Encounter (Signed)
Patient called requesting path results ?

## 2022-03-01 NOTE — Telephone Encounter (Signed)
Spoke with the patient. She was asking about her pathology report. I advised the report has not been reviewed and I cannot make recommendations. Reassured her there were no indications of cancer or infection. Advised the polyp was the type that if left growing in the colon, had the potential to change into cancer, but this did not have any of those changes detected.  ?She tells me she is feeling a little better, she is moving around more and she is paying more attention to her diet. ?

## 2022-03-01 NOTE — Telephone Encounter (Signed)
Called the patient. No answer. Left her a voicemail of my returned call. ?

## 2022-03-02 NOTE — Telephone Encounter (Signed)
Called patient back, discussed results. ?Terminal ileum biopsies showed nonspecific ileitis, not characteristic of Crohn's disease or inflammatory bowel disease.  She is feeling better since stopping NSAIDs.  Most likely etiology of ileitis is NSAID related.  Advised patient to continue to avoid NSAIDs ? ?Ascending polyp removed is tubular adenoma, recall colonoscopy in 7 years ?Follow-up in office visit, next available appointment in 2 to 3 months. ? ?

## 2022-03-05 NOTE — Telephone Encounter (Signed)
Recall entered. Message for calling to schedule an appointment in 2 to 3 months entered into Epic. ?

## 2022-03-06 ENCOUNTER — Encounter: Payer: Self-pay | Admitting: Gastroenterology

## 2022-03-19 ENCOUNTER — Emergency Department (HOSPITAL_COMMUNITY): Payer: Medicaid Other

## 2022-03-19 ENCOUNTER — Encounter (HOSPITAL_COMMUNITY): Payer: Self-pay | Admitting: Emergency Medicine

## 2022-03-19 ENCOUNTER — Emergency Department (HOSPITAL_COMMUNITY)
Admission: EM | Admit: 2022-03-19 | Discharge: 2022-03-19 | Discharge status: Against Medical Advice | Payer: Medicaid Other | Attending: Emergency Medicine | Admitting: Emergency Medicine

## 2022-03-19 DIAGNOSIS — I10 Essential (primary) hypertension: Secondary | ICD-10-CM | POA: Diagnosis not present

## 2022-03-19 DIAGNOSIS — R079 Chest pain, unspecified: Secondary | ICD-10-CM | POA: Diagnosis present

## 2022-03-19 DIAGNOSIS — Z5321 Procedure and treatment not carried out due to patient leaving prior to being seen by health care provider: Secondary | ICD-10-CM | POA: Diagnosis not present

## 2022-03-19 DIAGNOSIS — J449 Chronic obstructive pulmonary disease, unspecified: Secondary | ICD-10-CM | POA: Diagnosis not present

## 2022-03-19 DIAGNOSIS — J45909 Unspecified asthma, uncomplicated: Secondary | ICD-10-CM | POA: Diagnosis not present

## 2022-03-19 DIAGNOSIS — E119 Type 2 diabetes mellitus without complications: Secondary | ICD-10-CM | POA: Diagnosis not present

## 2022-03-19 LAB — BASIC METABOLIC PANEL
Anion gap: 10 (ref 5–15)
BUN: 9 mg/dL (ref 6–20)
CO2: 22 mmol/L (ref 22–32)
Calcium: 9.3 mg/dL (ref 8.9–10.3)
Chloride: 104 mmol/L (ref 98–111)
Creatinine, Ser: 0.88 mg/dL (ref 0.44–1.00)
GFR, Estimated: >60 mL/min (ref >60)
Glucose, Bld: 82 mg/dL (ref 70–99)
Potassium: 3.8 mmol/L (ref 3.5–5.1)
Sodium: 136 mmol/L (ref 135–145)

## 2022-03-19 LAB — CBC
HCT: 37.7 % (ref 36.0–46.0)
Hemoglobin: 12.9 g/dL (ref 12.0–15.0)
MCH: 28.4 pg (ref 26.0–34.0)
MCHC: 34.2 g/dL (ref 30.0–36.0)
MCV: 83 fL (ref 80.0–100.0)
Platelets: 300 10*3/uL (ref 150–400)
RBC: 4.54 MIL/uL (ref 3.87–5.11)
RDW: 13.6 % (ref 11.5–15.5)
WBC: 9.5 10*3/uL (ref 4.0–10.5)
nRBC: 0 % (ref 0.0–0.2)

## 2022-03-19 LAB — TROPONIN I (HIGH SENSITIVITY): Troponin I (High Sensitivity): <2 ng/L (ref <18)

## 2022-03-19 LAB — I-STAT BETA HCG BLOOD, ED (MC, WL, AP ONLY): I-stat hCG, quantitative: <5 m[IU]/mL (ref <5)

## 2022-03-19 NOTE — ED Notes (Signed)
Pt leaving ED, armband cut off.  ?

## 2022-03-19 NOTE — ED Triage Notes (Signed)
Patient complains of sharp, squeezing central chest pain that started a few weeks ago. Denies cough, denies fevers, patient is alert, oriented, ambulatory, speaking in complete sentences and is in no apparently distress at this time. ?

## 2022-03-19 NOTE — ED Provider Triage Note (Signed)
Emergency Medicine Provider Triage Evaluation Note ? ?Andrea Archer , a 21 y.o. female  was evaluated in triage.  Pt complains of central chest pain onset several months ago worsening several weeks ago. Hasn't tried any medications for her symptoms. Denies shortness of breath, fever, chills, abdominal pain, nausea, vomiting. Denies sick contacts. Has a history of sickle cell trait. Denies history of asthma, COPD, MI, cardiac catherization, stent, DM, HTN.  Patient notes that she is unsure if it is her anxiety that is the cause of her symptoms, she notes she had a lot of family stressors recently. ? ? ?Review of Systems  ?Positive: As per HPI above ?Negative:  ? ?Physical Exam  ?BP 129/89 (BP Location: Right Arm)   Pulse 87   Temp 98.7 ?F (37.1 ?C) (Oral)   Resp 16   LMP 03/09/2022 (Exact Date)   SpO2 100%  ?Gen:   Awake, no distress   ?Resp:  Normal effort  ?MSK:   Moves extremities without difficulty  ?Other:  Sternal and left chest wall with mild tenderness to palpation.   ? ?Medical Decision Making  ?Medically screening exam initiated at 5:28 PM.  Appropriate orders placed.  Andrea Archer was informed that the remainder of the evaluation will be completed by another provider, this initial triage assessment does not replace that evaluation, and the importance of remaining in the ED until their evaluation is complete. ? ?  ?Kaidance Pantoja A, PA-C ?03/19/22 1743 ? ?

## 2022-03-19 NOTE — ED Notes (Signed)
No response to role call or vitals  ?

## 2022-04-18 ENCOUNTER — Encounter: Payer: Self-pay | Admitting: Obstetrics

## 2022-04-25 ENCOUNTER — Ambulatory Visit: Payer: Medicaid Other | Admitting: Emergency Medicine

## 2022-04-25 ENCOUNTER — Other Ambulatory Visit (HOSPITAL_COMMUNITY)
Admission: RE | Admit: 2022-04-25 | Discharge: 2022-04-25 | Disposition: A | Discharge status: Home / Self Care | Payer: Medicaid Other | Source: Ambulatory Visit | Attending: Obstetrics and Gynecology | Admitting: Obstetrics and Gynecology

## 2022-04-25 VITALS — BP 105/77 | HR 88 | Ht 67.0 in | Wt 142.0 lb

## 2022-04-25 DIAGNOSIS — Z113 Encounter for screening for infections with a predominantly sexual mode of transmission: Secondary | ICD-10-CM

## 2022-04-25 NOTE — Progress Notes (Signed)
SUBJECTIVE:  ?21 y.o. female complains of clear vaginal discharge for 10 day(s). ?Denies abnormal vaginal bleeding or significant pelvic pain or ?fever. No UTI symptoms. Denies history of known exposure to STD. ? ?No LMP recorded. (Menstrual status: Irregular Periods). ? ?OBJECTIVE:  ?She appears well, afebrile. ?Urine dipstick: not done. ? ?ASSESSMENT:  ?Vaginal Discharge clear ?Vaginal Odor- denies ? ? ?PLAN:  ?GC, chlamydia, trichomonas, BVAG, CVAG probe sent to lab. ?Treatment: To be determined once lab results are received ?ROV prn if symptoms persist or worsen.  ?

## 2022-04-26 ENCOUNTER — Encounter: Payer: Self-pay | Admitting: Obstetrics and Gynecology

## 2022-04-26 LAB — CERVICOVAGINAL ANCILLARY ONLY
Bacterial Vaginitis (gardnerella): POSITIVE — AB
Candida Glabrata: NEGATIVE
Candida Vaginitis: NEGATIVE
Chlamydia: NEGATIVE
Comment: NEGATIVE
Comment: NEGATIVE
Comment: NEGATIVE
Comment: NEGATIVE
Comment: NEGATIVE
Comment: NORMAL
Neisseria Gonorrhea: NEGATIVE
Trichomonas: NEGATIVE

## 2022-04-27 ENCOUNTER — Other Ambulatory Visit: Payer: Self-pay

## 2022-04-27 MED ORDER — METRONIDAZOLE 500 MG PO TABS: 500.0000 mg | ORAL_TABLET | Freq: Two times a day (BID) | ORAL | 0 refills | Status: DC

## 2022-05-13 ENCOUNTER — Other Ambulatory Visit: Payer: Self-pay

## 2022-05-13 ENCOUNTER — Inpatient Hospital Stay (HOSPITAL_COMMUNITY)
Admission: AD | Admit: 2022-05-13 | Discharge: 2022-05-13 | Disposition: A | Discharge status: Home / Self Care | Payer: Medicaid Other | Attending: Obstetrics & Gynecology | Admitting: Obstetrics & Gynecology

## 2022-05-13 DIAGNOSIS — Z3202 Encounter for pregnancy test, result negative: Secondary | ICD-10-CM | POA: Diagnosis present

## 2022-05-13 LAB — POCT PREGNANCY, URINE: Preg Test, Ur: NEGATIVE

## 2022-05-13 NOTE — MAU Provider Note (Signed)
Event Date/Time  ?First Provider Initiated Contact with Patient 05/13/22 1601   ?  ? ?S ?[name] is a 21 y.o. G1P1001 patient who presents to MAU today for pregnancy confirmation and evaluation of nausea and abdominal pain. Patient states she obtained a "Not Pregnant" result on the readout of a digital home pregnancy test. She states she has friends who told her those tests are not reliable, and the only way to tell if she is pregnant is to open the test and "read the strips". When she opened the casing of her home test she saw two lines, and is concerned that she is pregnant. ?LMP 05/09/2022 ? ?O ?BP 120/81 (BP Location: Right Arm)   Pulse 65   Temp 98.4 ?F (36.9 ?C) (Oral)   Resp 15   LMP 05/09/2022 (Exact Date)   SpO2 98%   ? ?Physical Exam ?Vitals and nursing note reviewed. Exam conducted with a chaperone present.  ?Cardiovascular:  ?   Rate and Rhythm: Normal rate.  ?Pulmonary:  ?   Effort: Pulmonary effort is normal.  ? ? ?A ?Medical screening exam complete ?Negative home pregnancy test ?Negative UPT in MAU ?Explained to patient that based on negative home test and negative MAU UPT, she is not eligible for a Quant hCG today ? ?P ?Discharge from MAU in stable condition ? ?F/U: ?Patient can walk-in to any [facility] office M-F during business hours for pregnancy confirmation ? ? ?Mallie Snooks, CNM ?05/13/2022 4:10 PM  ? ?

## 2022-05-13 NOTE — MAU Note (Signed)
Pt reports to mau with c/o nausea and abd pain. Pt states she took a digital hpt today which read "not pregnant" but states she opened up the test and there were 2 lines so she believes she may be preg.  LMP 5/10 Denies vag bleeding.  ?

## 2022-05-16 ENCOUNTER — Other Ambulatory Visit (HOSPITAL_COMMUNITY)
Admission: RE | Admit: 2022-05-16 | Discharge: 2022-05-16 | Disposition: A | Discharge status: Home / Self Care | Payer: Medicaid Other | Source: Ambulatory Visit | Attending: Obstetrics | Admitting: Obstetrics

## 2022-05-16 ENCOUNTER — Ambulatory Visit (INDEPENDENT_AMBULATORY_CARE_PROVIDER_SITE_OTHER): Payer: Medicaid Other

## 2022-05-16 DIAGNOSIS — Z3202 Encounter for pregnancy test, result negative: Secondary | ICD-10-CM

## 2022-05-16 DIAGNOSIS — N898 Other specified noninflammatory disorders of vagina: Secondary | ICD-10-CM | POA: Diagnosis not present

## 2022-05-16 DIAGNOSIS — Z3200 Encounter for pregnancy test, result unknown: Secondary | ICD-10-CM

## 2022-05-16 LAB — POCT URINE PREGNANCY: Preg Test, Ur: NEGATIVE

## 2022-05-16 NOTE — Progress Notes (Signed)
..  SUBJECTIVE:  21 y.o. female complains of  vaginal discharge for 4 day(s). Denies abnormal vaginal bleeding or significant pelvic pain or fever. No UTI symptoms. Denies history of known exposure to STD.  Patient's last menstrual period was 05/04/2022 (exact date). Pt was seen at MAU on 05/13/22 related to nausea and a negative UPT, pt was advised to follow up here. Pt stated that her last cycle lasted 5 days with normal amount of bleeding but was more painful so she was concerned about being pregnant.  OBJECTIVE:  She appears well, afebrile. Urine dipstick: not done.  ASSESSMENT:  Vaginal Discharge  UPT is negative in office today.   PLAN:  GC, chlamydia, trichomonas, BVAG, CVAG probe sent to lab. Treatment: To be determined once lab results are received ROV prn if symptoms persist or worsen. Requests STD bloodwork Advised to follow up if she misses a menstrual cycle

## 2022-05-17 ENCOUNTER — Other Ambulatory Visit: Payer: Self-pay | Admitting: Obstetrics and Gynecology

## 2022-05-17 ENCOUNTER — Other Ambulatory Visit: Payer: Self-pay | Admitting: *Deleted

## 2022-05-17 ENCOUNTER — Encounter (HOSPITAL_COMMUNITY): Payer: Self-pay | Admitting: Emergency Medicine

## 2022-05-17 ENCOUNTER — Other Ambulatory Visit: Payer: Self-pay

## 2022-05-17 ENCOUNTER — Emergency Department (HOSPITAL_COMMUNITY): Payer: Medicaid Other

## 2022-05-17 ENCOUNTER — Emergency Department (HOSPITAL_COMMUNITY)
Admission: EM | Admit: 2022-05-17 | Discharge: 2022-05-17 | Disposition: A | Discharge status: Home / Self Care | Payer: Medicaid Other | Attending: Emergency Medicine | Admitting: Emergency Medicine

## 2022-05-17 DIAGNOSIS — W1830XA Fall on same level, unspecified, initial encounter: Secondary | ICD-10-CM | POA: Diagnosis not present

## 2022-05-17 DIAGNOSIS — B9689 Other specified bacterial agents as the cause of diseases classified elsewhere: Secondary | ICD-10-CM

## 2022-05-17 DIAGNOSIS — S63501A Unspecified sprain of right wrist, initial encounter: Secondary | ICD-10-CM | POA: Insufficient documentation

## 2022-05-17 DIAGNOSIS — B379 Candidiasis, unspecified: Secondary | ICD-10-CM

## 2022-05-17 DIAGNOSIS — S6991XA Unspecified injury of right wrist, hand and finger(s), initial encounter: Secondary | ICD-10-CM | POA: Diagnosis present

## 2022-05-17 LAB — CERVICOVAGINAL ANCILLARY ONLY
Bacterial Vaginitis (gardnerella): POSITIVE — AB
Candida Glabrata: NEGATIVE
Candida Vaginitis: POSITIVE — AB
Chlamydia: NEGATIVE
Comment: NEGATIVE
Comment: NEGATIVE
Comment: NEGATIVE
Comment: NEGATIVE
Comment: NEGATIVE
Comment: NORMAL
Neisseria Gonorrhea: NEGATIVE
Trichomonas: NEGATIVE

## 2022-05-17 LAB — HIV ANTIBODY (ROUTINE TESTING W REFLEX): HIV Screen 4th Generation wRfx: NONREACTIVE

## 2022-05-17 LAB — HEPATITIS B SURFACE ANTIGEN: Hepatitis B Surface Ag: NEGATIVE

## 2022-05-17 LAB — RPR: RPR Ser Ql: NONREACTIVE

## 2022-05-17 LAB — HEPATITIS C ANTIBODY: Hep C Virus Ab: NONREACTIVE

## 2022-05-17 MED ORDER — IBUPROFEN 800 MG PO TABS: 800.0000 mg | ORAL_TABLET | Freq: Three times a day (TID) | ORAL | 1 refills | Status: DC | PRN

## 2022-05-17 MED ORDER — FLUCONAZOLE 150 MG PO TABS: 150.0000 mg | ORAL_TABLET | Freq: Once | ORAL | 0 refills | Status: AC

## 2022-05-17 MED ORDER — METRONIDAZOLE 500 MG PO TABS: 500.0000 mg | ORAL_TABLET | Freq: Two times a day (BID) | ORAL | 0 refills | Status: DC

## 2022-05-17 NOTE — ED Provider Notes (Signed)
[facility] EMERGENCY DEPARTMENT Provider Note   CSN: [id number] Arrival date & time: 05/17/22  1836     History  Chief Complaint  Patient presents with   Wrist Pain    Andrea Archer is a 21 y.o. female.  Patient fell onto her right wrist.  Patient has no additional medical problems  The history is provided by the patient and medical records. No language interpreter was used.  Wrist Pain This is a new problem. The current episode started 2 days ago. The problem occurs constantly. The problem has not changed since onset.Pertinent negatives include no chest pain, no abdominal pain and no headaches. Nothing aggravates the symptoms. Nothing relieves the symptoms. She has tried nothing for the symptoms. The treatment provided no relief.      Home Medications Prior to Admission medications   Medication Sig Start Date End Date Taking? Authorizing Provider  ibuprofen (ADVIL) 800 MG tablet Take 1 tablet (800 mg total) by mouth every 8 (eight) hours as needed for moderate pain. 05/17/22  Yes Milton Ferguson, MD  fluconazole (DIFLUCAN) 150 MG tablet Take 1 tablet (150 mg total) by mouth once for 1 dose. 05/17/22 05/17/22  Shelly Bombard, MD  metroNIDAZOLE (FLAGYL) 500 MG tablet Take 1 tablet (500 mg total) by mouth 2 (two) times daily. Patient not taking: Reported on 05/16/2022 04/27/22   Chancy Milroy, MD  metroNIDAZOLE (FLAGYL) 500 MG tablet Take 1 tablet (500 mg total) by mouth 2 (two) times daily. 05/17/22   Shelly Bombard, MD      Allergies    Mushroom extract complex and Pineapple    Review of Systems   Review of Systems  Constitutional:  Negative for appetite change and fatigue.  HENT:  Negative for congestion, ear discharge and sinus pressure.   Eyes:  Negative for discharge.  Respiratory:  Negative for cough.   Cardiovascular:  Negative for chest pain.  Gastrointestinal:  Negative for abdominal pain and diarrhea.  Genitourinary:  Negative for frequency  and hematuria.  Musculoskeletal:  Negative for back pain.       Right wrist pain  Skin:  Negative for rash.  Neurological:  Negative for seizures and headaches.  Psychiatric/Behavioral:  Negative for hallucinations.    Physical Exam Updated Vital Signs BP 119/88 (BP Location: Left Arm)   Pulse 93   Temp 99 F (37.2 C) (Oral)   Resp 15   LMP 05/09/2022 (Exact Date)   SpO2 100%  Physical Exam Vitals and nursing note reviewed.  Constitutional:      Appearance: She is well-developed.  HENT:     Head: Normocephalic.     Nose: Nose normal.  Eyes:     General: No scleral icterus.    Conjunctiva/sclera: Conjunctivae normal.  Neck:     Thyroid: No thyromegaly.     Trachea: No tracheal deviation.  Cardiovascular:     Rate and Rhythm: Normal rate and regular rhythm.     Heart sounds: No murmur heard.   No friction rub. No gallop.  Pulmonary:     Breath sounds: No stridor. No wheezing or rales.  Chest:     Chest wall: No tenderness.  Abdominal:     General: There is no distension.     Tenderness: There is no abdominal tenderness. There is no rebound.  Musculoskeletal:     Cervical back: Neck supple.     Comments: Tenderness right wrist  Lymphadenopathy:     Cervical: No cervical adenopathy.  Skin:  General: Skin is warm.     Findings: No erythema or rash.  Neurological:     Mental Status: She is alert and oriented to person, place, and time.     Motor: No abnormal muscle tone.     Coordination: Coordination normal.  Psychiatric:        Behavior: Behavior normal.    ED Results / Procedures / Treatments   Labs (all labs ordered are listed, but only abnormal results are displayed) Labs Reviewed - No data to display  EKG None  Radiology DG Wrist Complete Right  Result Date: 05/17/2022 CLINICAL DATA:  [place]. EXAM: RIGHT WRIST - COMPLETE 3+ VIEW; RIGHT HAND - COMPLETE 3+ VIEW COMPARISON:  None Available. FINDINGS: Right wrist: There is no evidence of fracture or  dislocation. There is no evidence of arthropathy or other focal bone abnormality. There is soft tissue swelling surrounding the wrist. Right hand:There is no evidence of fracture or dislocation. There is no evidence of arthropathy or other focal bone abnormality. Soft tissues are within normal limits. IMPRESSION: 1. No acute fracture or dislocation of the right wrist or right hand. 2. Right wrist soft tissue swelling. Electronically Signed   By: Ronney Asters M.D.   On: 05/17/2022 19:43   DG Hand Complete Right  Result Date: 05/17/2022 CLINICAL DATA:  [place]. EXAM: RIGHT WRIST - COMPLETE 3+ VIEW; RIGHT HAND - COMPLETE 3+ VIEW COMPARISON:  None Available. FINDINGS: Right wrist: There is no evidence of fracture or dislocation. There is no evidence of arthropathy or other focal bone abnormality. There is soft tissue swelling surrounding the wrist. Right hand:There is no evidence of fracture or dislocation. There is no evidence of arthropathy or other focal bone abnormality. Soft tissues are within normal limits. IMPRESSION: 1. No acute fracture or dislocation of the right wrist or right hand. 2. Right wrist soft tissue swelling. Electronically Signed   By: Ronney Asters M.D.   On: 05/17/2022 19:43    Procedures Procedures    Medications Ordered in ED Medications - No data to display  ED Course/ Medical Decision Making/ A&P                           Medical Decision Making Risk Prescription drug management.  This patient presents to the ED for concern of wrist pain, this involves an extensive number of treatment options, and is a complaint that carries with it a high risk of complications and morbidity.  The differential diagnosis includes wrist fracture or sprain   Co morbidities that complicate the patient evaluation  None   Additional history obtained:  Additional history obtained from patient External records from outside source obtained and reviewed including hospital records   Lab  Tests:  No labs  Imaging Studies ordered:  I ordered imaging studies including right wrist I independently visualized and interpreted imaging which showed negative normal sinus rhythm I agree with the radiologist interpretation   Cardiac Monitoring: / EKG:  The patient was maintained on a cardiac monitor.  I personally viewed and interpreted the cardiac monitored which showed an underlying rhythm of: Normal sinus rhythm   Consultations Obtained:  No consult  Problem List / ED Course / Critical interventions / Medication management  Wrist pain No medicines ordered Reevaluation of the patient after these medicines showed that the patient stayed the same I have reviewed the patients home medicines and have made adjustments as needed   Social Determinants of Health:  None   Test / Admission - Considered:  No admission needed  Patient with a sprained right wrist.  She is given a wrist splint and a prescription for Motrin and will follow-up with PCP        Final Clinical Impression(s) / ED Diagnoses Final diagnoses:  Sprain of right wrist, initial encounter    Rx / DC Orders ED Discharge Orders          Ordered    ibuprofen (ADVIL) 800 MG tablet  Every 8 hours PRN        05/17/22 2241              Milton Ferguson, MD 05/21/22 [phone]

## 2022-05-17 NOTE — Discharge Instructions (Addendum)
Follow-up with your family doctor in the next couple weeks if not improving

## 2022-05-17 NOTE — Progress Notes (Signed)
Pt request RX for BV and yeast on swab. RX Flagyl and Diflucan. Instructions to use Diflucan after Flagyl.

## 2022-05-17 NOTE — ED Triage Notes (Signed)
Patient complains of right wrist and forearm pain after falling earlier today. Pain is exacerbated by movement. Denies LOC. Patient is alert, oriented, ambulatory.

## 2022-05-17 NOTE — ED Provider Triage Note (Signed)
Emergency Medicine Provider Triage Evaluation Note  Ofelia Podolski , a 21 y.o. female  was evaluated in triage.  Pt complains of right wrist pain.  States that she was skating when she fell onto her outstretched hand.  Denies any loss of consciousness.  She has been having pain on the ulnar side of her right wrist.  Denies any numbness.  Review of Systems  Positive: Wrist pain Negative: Numbness  Physical Exam  LMP 05/09/2022 (Exact Date)  Gen:   Awake, no distress   Resp:  Normal effort  MSK:   Moves extremities without difficulty  Other:  Numbness palpation of the right wrist with limited range of motion.  2+ radial pulse palpated.  No deformities.  Normal range of motion of the right elbow there is an abrasion noted  Medical Decision Making  Medically screening exam initiated at 6:52 PM.  Appropriate orders placed.  Maizy Davanzo was informed that the remainder of the evaluation will be completed by another provider, this initial triage assessment does not replace that evaluation, and the importance of remaining in the ED until their evaluation is complete.  We will order x-ray   Delia Heady, PA-C 05/17/22 1853

## 2022-06-01 ENCOUNTER — Ambulatory Visit: Payer: Medicaid Other | Admitting: Obstetrics

## 2022-06-13 ENCOUNTER — Other Ambulatory Visit (HOSPITAL_COMMUNITY)
Admission: RE | Admit: 2022-06-13 | Discharge: 2022-06-13 | Disposition: A | Discharge status: Home / Self Care | Payer: Medicaid Other | Source: Ambulatory Visit | Attending: Obstetrics | Admitting: Obstetrics

## 2022-06-13 ENCOUNTER — Ambulatory Visit (INDEPENDENT_AMBULATORY_CARE_PROVIDER_SITE_OTHER): Payer: Medicaid Other

## 2022-06-13 DIAGNOSIS — N898 Other specified noninflammatory disorders of vagina: Secondary | ICD-10-CM

## 2022-06-13 MED ORDER — FLUCONAZOLE 150 MG PO TABS
150.0000 mg | ORAL_TABLET | Freq: Once | ORAL | 0 refills | Status: AC
Start: 2022-06-13 — End: 2022-06-13

## 2022-06-13 NOTE — Progress Notes (Signed)
Agree with nurses's documentation of this patient's clinic encounter.  Alessander Sikorski L, MD  

## 2022-06-13 NOTE — Progress Notes (Signed)
..  SUBJECTIVE:  21 y.o. female complains of white vaginal discharge for 4 day(s). Denies abnormal vaginal bleeding or significant pelvic pain or fever. No UTI symptoms. Denies history of known exposure to STD. Pt reports that she previously took Flagyl and symptoms started after.  Patient's last menstrual period was 05/04/2022 (exact date).  OBJECTIVE:  She appears well, afebrile. Urine dipstick: not done.  ASSESSMENT:  Vaginal Discharge  Vaginal Irritation   PLAN:  GC, chlamydia, trichomonas, BVAG, CVAG probe sent to lab. Treatment: To be determined once lab results are received ROV prn if symptoms persist or worsen.

## 2022-06-14 ENCOUNTER — Encounter: Payer: Self-pay | Admitting: Obstetrics and Gynecology

## 2022-06-14 LAB — CERVICOVAGINAL ANCILLARY ONLY
Bacterial Vaginitis (gardnerella): POSITIVE — AB
Candida Glabrata: NEGATIVE
Candida Vaginitis: POSITIVE — AB
Chlamydia: NEGATIVE
Comment: NEGATIVE
Comment: NEGATIVE
Comment: NEGATIVE
Comment: NEGATIVE
Comment: NEGATIVE
Comment: NORMAL
Neisseria Gonorrhea: NEGATIVE
Trichomonas: NEGATIVE

## 2022-06-18 ENCOUNTER — Other Ambulatory Visit: Payer: Self-pay | Admitting: *Deleted

## 2022-06-18 DIAGNOSIS — B9689 Other specified bacterial agents as the cause of diseases classified elsewhere: Secondary | ICD-10-CM

## 2022-06-18 MED ORDER — METRONIDAZOLE 500 MG PO TABS: 500.0000 mg | ORAL_TABLET | Freq: Two times a day (BID) | ORAL | 0 refills | Status: DC

## 2022-06-18 MED ORDER — FLUCONAZOLE 150 MG PO TABS: 150.0000 mg | ORAL_TABLET | Freq: Once | ORAL | 0 refills | Status: AC

## 2022-06-18 NOTE — Progress Notes (Signed)
Treatment for BV and yeast sent today.

## 2022-07-02 ENCOUNTER — Ambulatory Visit: Payer: Medicaid Other | Admitting: Obstetrics

## 2022-07-07 IMAGING — CR DG WRIST COMPLETE 3+V*R*
3 series · 3 of 3 positions shown · non-contrast
Comparison: None Available.

CLINICAL DATA: [facility].

EXAM:
RIGHT WRIST - COMPLETE 3+ VIEW; RIGHT HAND - COMPLETE 3+ VIEW

[wrist pa]
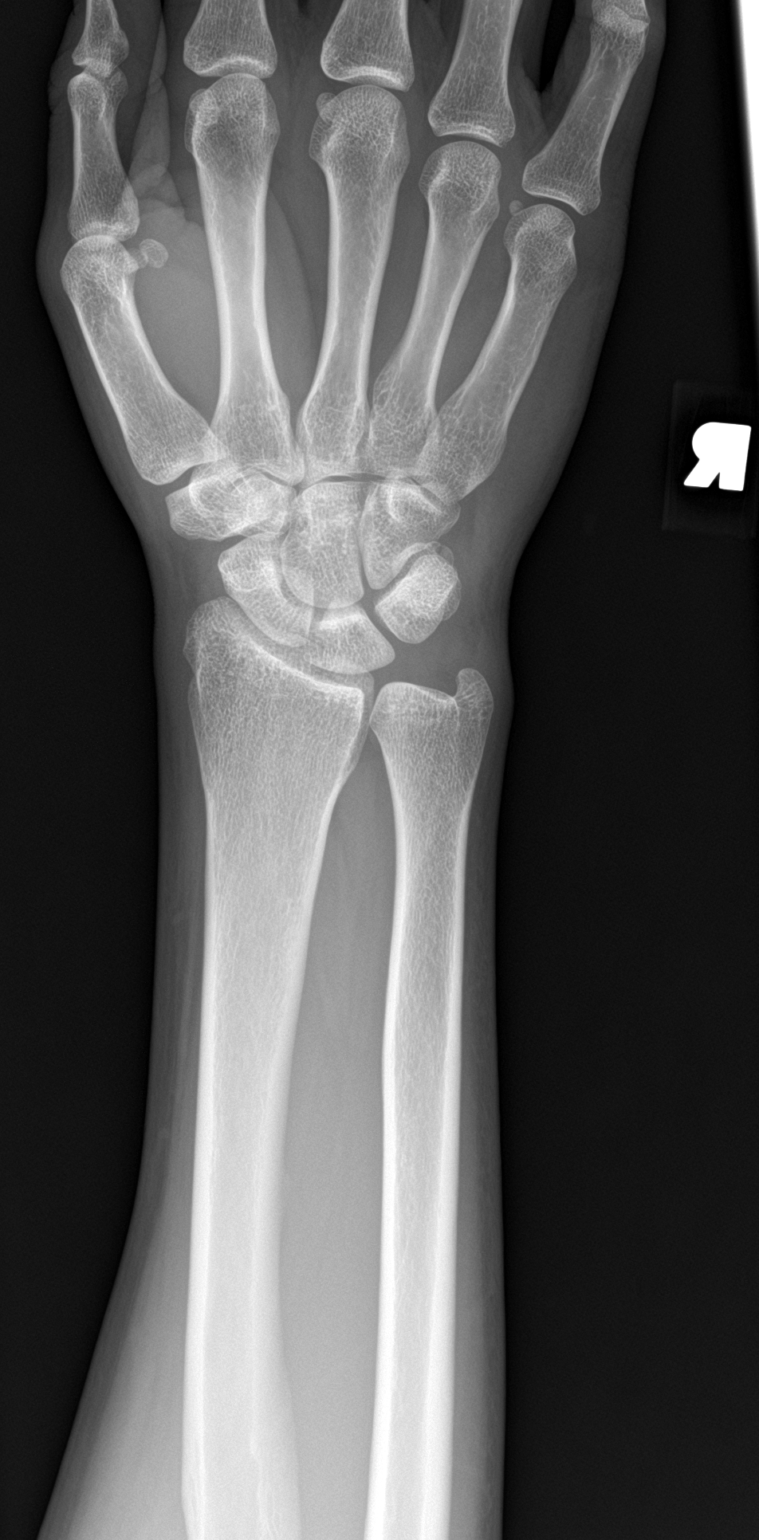

[wrist obl]
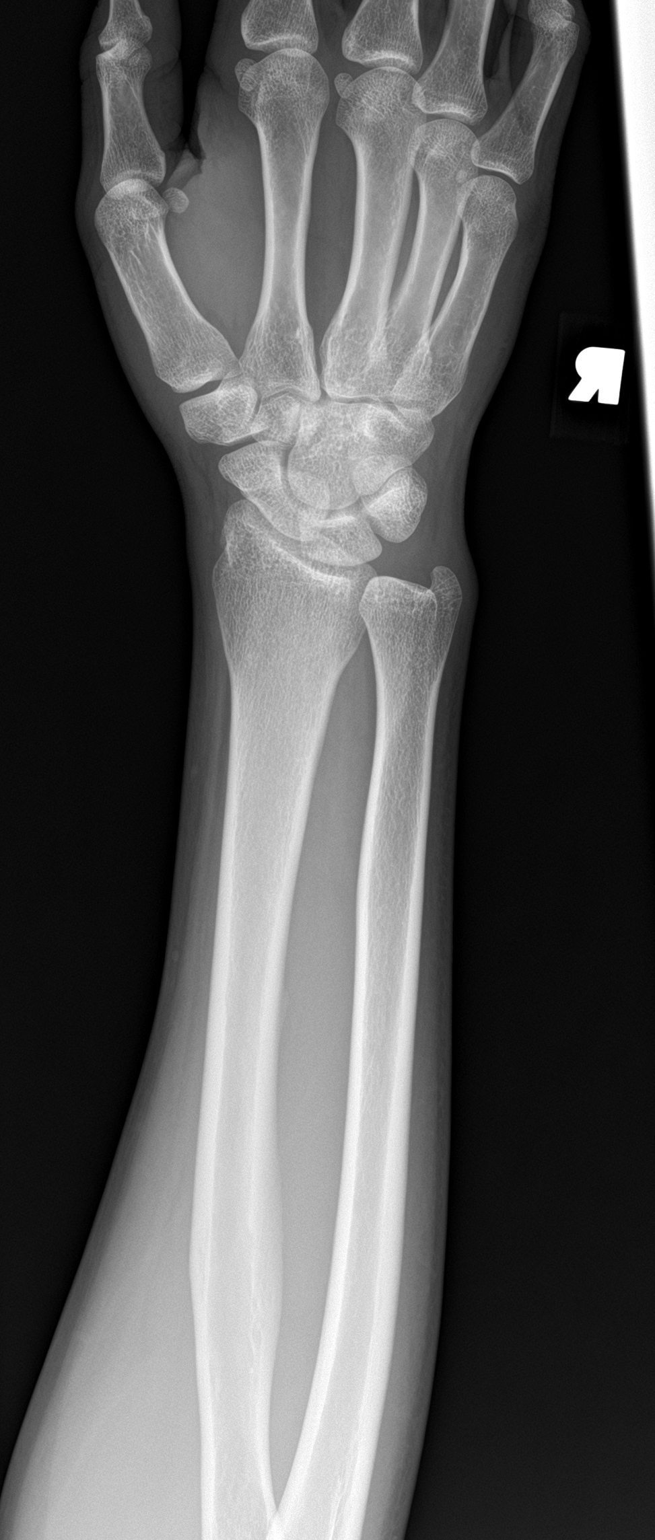

[wrist lat]
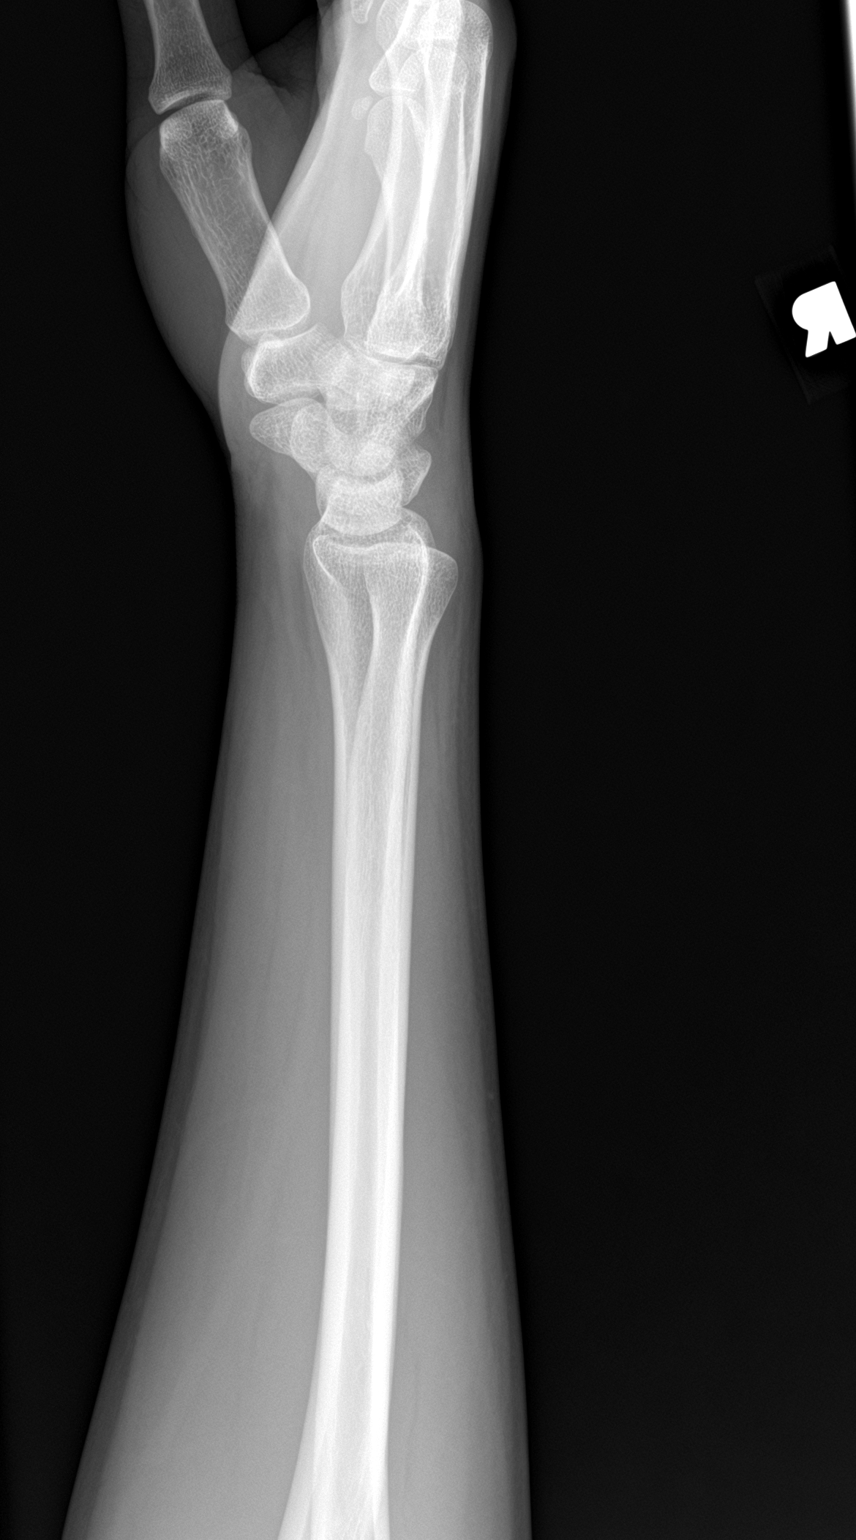

[3 of 3 positions shown; findings below may reference images not displayed]

FINDINGS: Right wrist: There is no evidence of fracture or dislocation. There
is no evidence of arthropathy or other focal bone abnormality. There
is soft tissue swelling surrounding the wrist.

Right hand:There is no evidence of fracture or dislocation. There is
no evidence of arthropathy or other focal bone abnormality. Soft
tissues are within normal limits.
IMPRESSION: 1. No acute fracture or dislocation of the right wrist or right
hand.
2. Right wrist soft tissue swelling.

## 2022-07-07 IMAGING — CR DG HAND COMPLETE 3+V*R*
3 series · 3 of 3 positions shown · non-contrast
Comparison: None Available.

CLINICAL DATA: FOOSH.

EXAM:
RIGHT WRIST - COMPLETE 3+ VIEW; RIGHT HAND - COMPLETE 3+ VIEW

[hand pa]
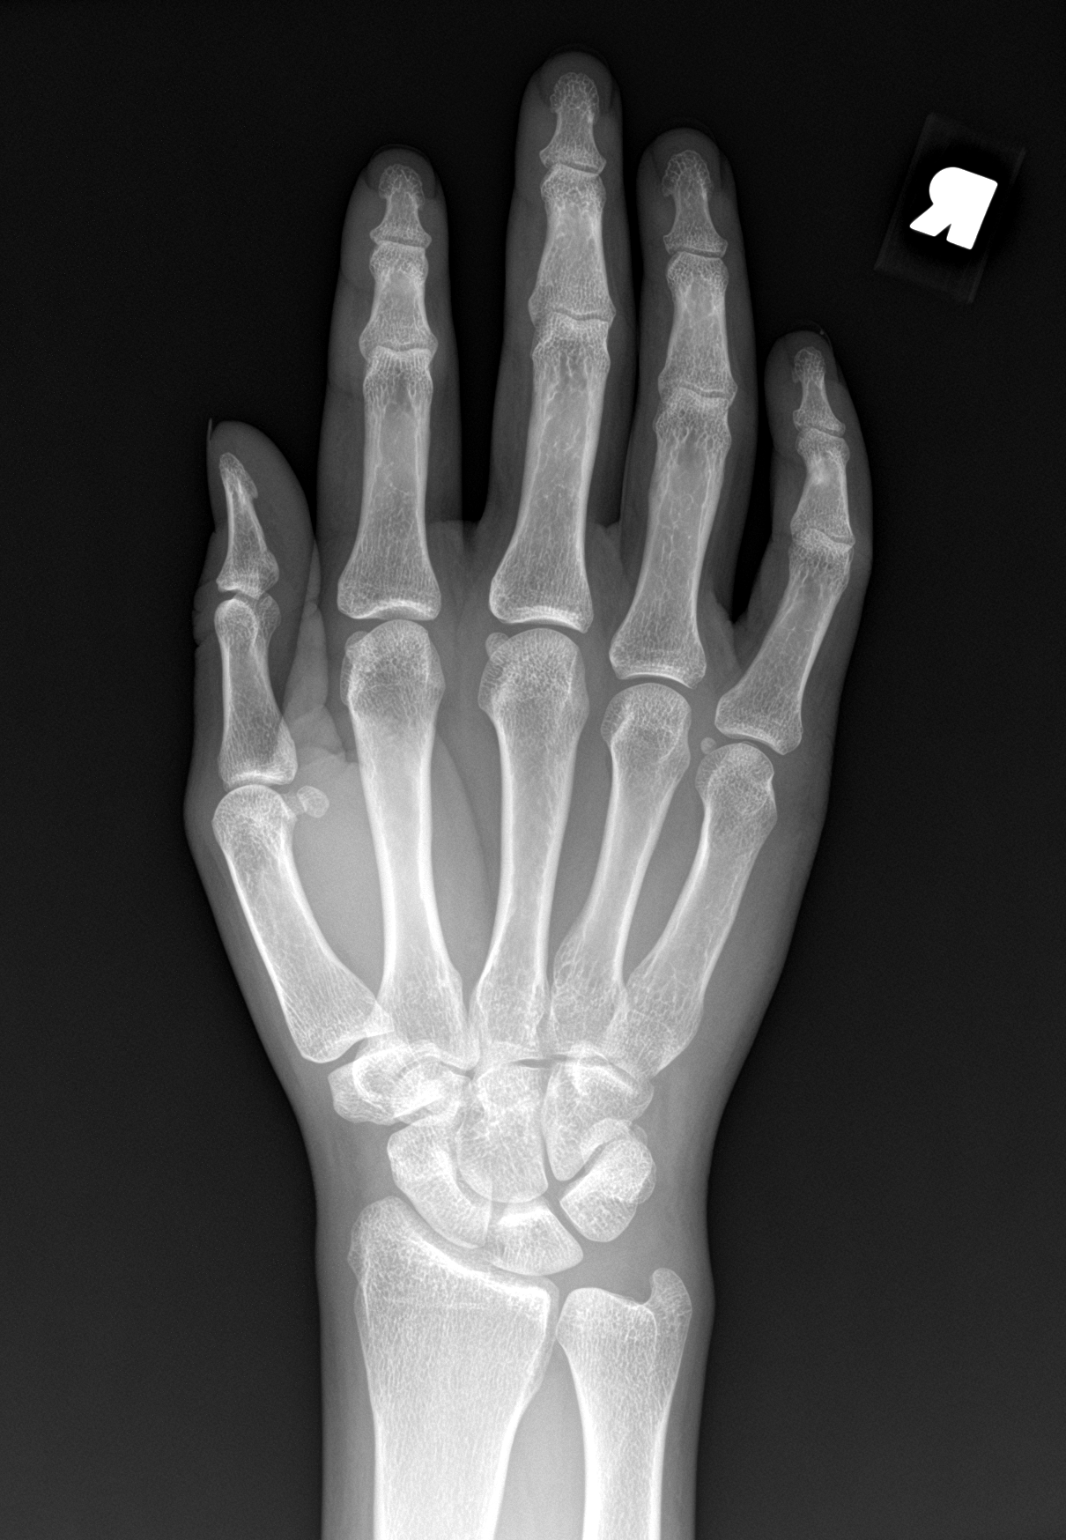

[hand obl]
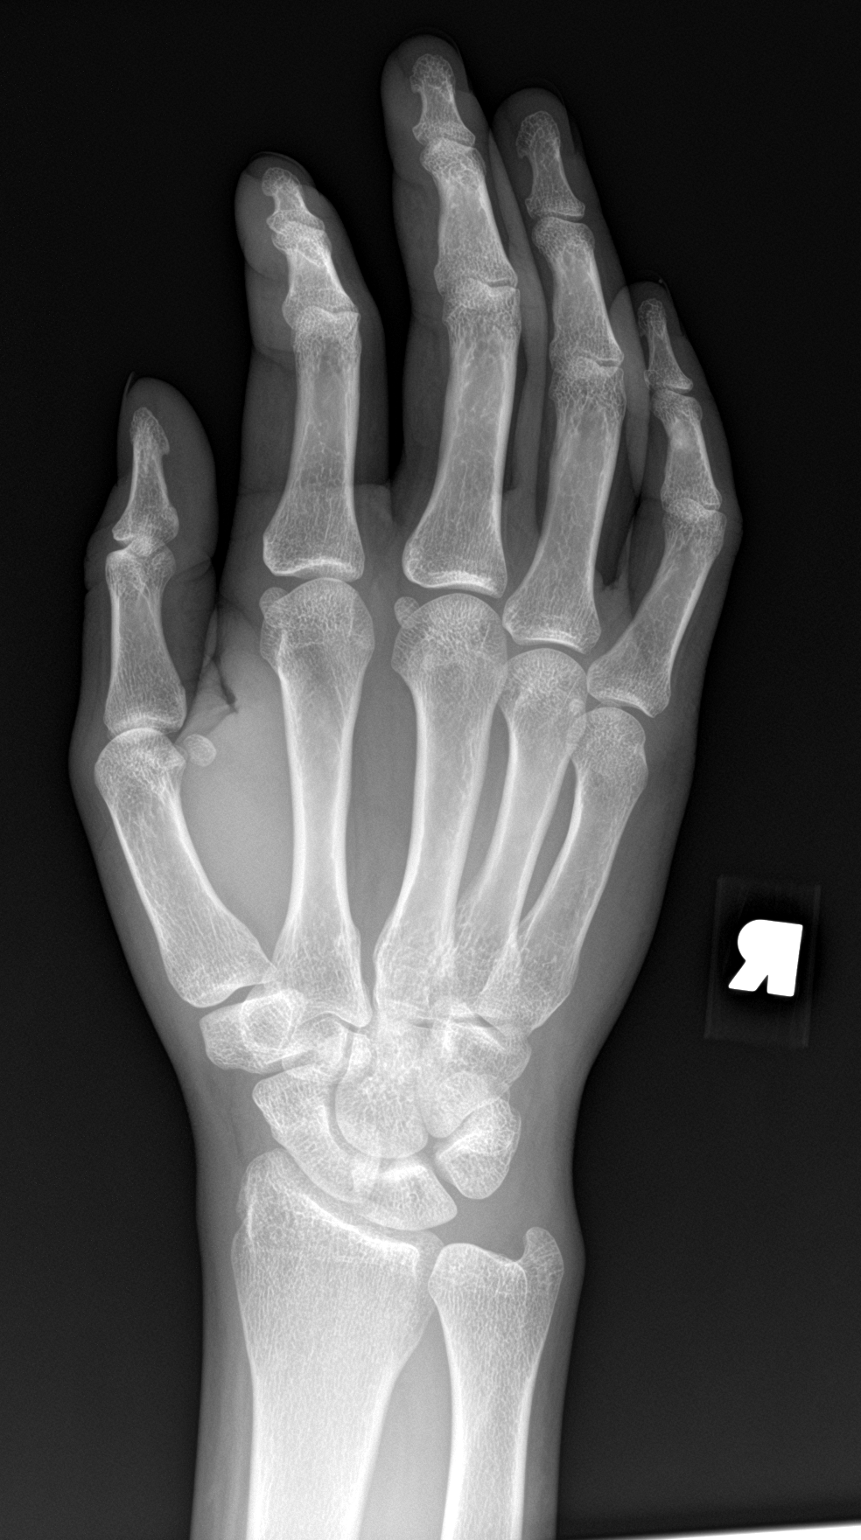

[hand lat]
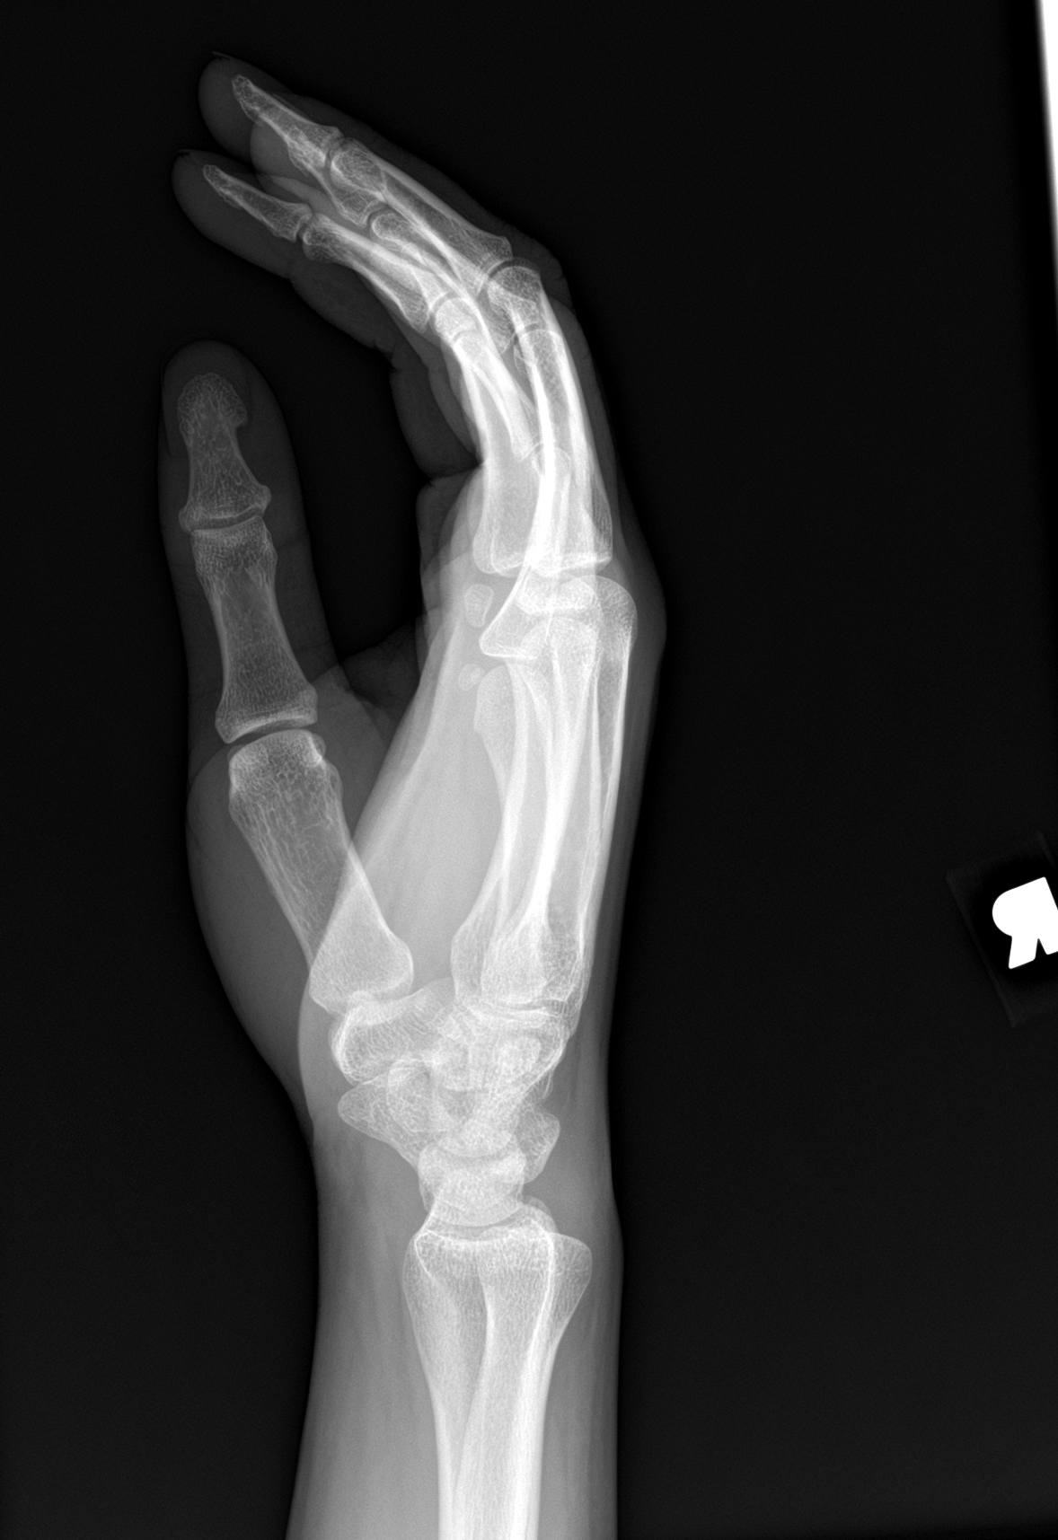

[3 of 3 positions shown; findings below may reference images not displayed]

FINDINGS: Right wrist: There is no evidence of fracture or dislocation. There
is no evidence of arthropathy or other focal bone abnormality. There
is soft tissue swelling surrounding the wrist.

Right hand:There is no evidence of fracture or dislocation. There is
no evidence of arthropathy or other focal bone abnormality. Soft
tissues are within normal limits.
IMPRESSION: 1. No acute fracture or dislocation of the right wrist or right
hand.
2. Right wrist soft tissue swelling.

## 2022-07-18 ENCOUNTER — Other Ambulatory Visit (HOSPITAL_COMMUNITY)
Admission: RE | Admit: 2022-07-18 | Discharge: 2022-07-18 | Disposition: A | Discharge status: Home / Self Care | Payer: Medicaid Other | Source: Ambulatory Visit | Attending: Obstetrics & Gynecology | Admitting: Obstetrics & Gynecology

## 2022-07-18 ENCOUNTER — Ambulatory Visit (INDEPENDENT_AMBULATORY_CARE_PROVIDER_SITE_OTHER): Payer: Medicaid Other

## 2022-07-18 DIAGNOSIS — N898 Other specified noninflammatory disorders of vagina: Secondary | ICD-10-CM | POA: Insufficient documentation

## 2022-07-18 NOTE — Progress Notes (Signed)
..  SUBJECTIVE:  21 y.o. female complains of white vaginal discharge for 1 week(s). States that she completed Flagyl and Diflucan 1 week ago and has not been sexually active, but still continues to have abnormal symptoms. Denies abnormal vaginal bleeding or significant pelvic pain or fever. No UTI symptoms. Denies history of known exposure to STD.  No LMP recorded. (Menstrual status: Irregular Periods).  OBJECTIVE:  She appears well, afebrile. Urine dipstick: not done.  ASSESSMENT:  Vaginal Discharge  Vaginal Irritation   PLAN:  GC, chlamydia, trichomonas, BVAG, CVAG probe sent to lab. Treatment: To be determined once lab results are received ROV prn if symptoms persist or worsen.

## 2022-07-19 LAB — HEPATITIS C ANTIBODY: Hep C Virus Ab: NONREACTIVE

## 2022-07-19 LAB — HEPATITIS B SURFACE ANTIGEN: Hepatitis B Surface Ag: NEGATIVE

## 2022-07-19 LAB — RPR: RPR Ser Ql: NONREACTIVE

## 2022-07-19 LAB — HIV ANTIBODY (ROUTINE TESTING W REFLEX): HIV Screen 4th Generation wRfx: NONREACTIVE

## 2022-07-20 ENCOUNTER — Encounter: Payer: Self-pay | Admitting: Obstetrics

## 2022-07-20 ENCOUNTER — Other Ambulatory Visit: Payer: Self-pay | Admitting: Obstetrics & Gynecology

## 2022-07-20 DIAGNOSIS — B9689 Other specified bacterial agents as the cause of diseases classified elsewhere: Secondary | ICD-10-CM

## 2022-07-20 LAB — CERVICOVAGINAL ANCILLARY ONLY
Bacterial Vaginitis (gardnerella): POSITIVE — AB
Candida Glabrata: NEGATIVE
Candida Vaginitis: NEGATIVE
Chlamydia: NEGATIVE
Comment: NEGATIVE
Comment: NEGATIVE
Comment: NEGATIVE
Comment: NEGATIVE
Comment: NEGATIVE
Comment: NORMAL
Neisseria Gonorrhea: NEGATIVE
Trichomonas: NEGATIVE

## 2022-07-20 MED ORDER — CLEOCIN 100 MG VA SUPP: 100.0000 mg | Freq: Every day | VAGINAL | 0 refills | Status: DC

## 2022-07-20 NOTE — Progress Notes (Signed)
Meds ordered this encounter  Medications   clindamycin (CLEOCIN) 100 MG vaginal suppository    Sig: Place 1 suppository (100 mg total) vaginally at bedtime.    Dispense:  3 suppository    Refill:  0

## 2022-07-25 ENCOUNTER — Encounter (HOSPITAL_COMMUNITY): Payer: Self-pay | Admitting: Emergency Medicine

## 2022-07-25 ENCOUNTER — Emergency Department (HOSPITAL_COMMUNITY)
Admission: EM | Admit: 2022-07-25 | Discharge: 2022-07-25 | Discharge status: Against Medical Advice | Payer: Medicaid Other | Attending: Emergency Medicine | Admitting: Emergency Medicine

## 2022-07-25 DIAGNOSIS — Z5321 Procedure and treatment not carried out due to patient leaving prior to being seen by health care provider: Secondary | ICD-10-CM | POA: Diagnosis not present

## 2022-07-25 DIAGNOSIS — R109 Unspecified abdominal pain: Secondary | ICD-10-CM | POA: Diagnosis present

## 2022-07-25 DIAGNOSIS — R112 Nausea with vomiting, unspecified: Secondary | ICD-10-CM | POA: Diagnosis not present

## 2022-07-25 LAB — COMPREHENSIVE METABOLIC PANEL
ALT: 11 U/L (ref 0–44)
AST: 17 U/L (ref 15–41)
Albumin: 4.1 g/dL (ref 3.5–5.0)
Alkaline Phosphatase: 59 U/L (ref 38–126)
Anion gap: 6 (ref 5–15)
BUN: 7 mg/dL (ref 6–20)
CO2: 23 mmol/L (ref 22–32)
Calcium: 9.7 mg/dL (ref 8.9–10.3)
Chloride: 109 mmol/L (ref 98–111)
Creatinine, Ser: 1.25 mg/dL — ABNORMAL HIGH (ref 0.44–1.00)
GFR, Estimated: >60 mL/min (ref >60)
Glucose, Bld: 86 mg/dL (ref 70–99)
Potassium: 3.6 mmol/L (ref 3.5–5.1)
Sodium: 138 mmol/L (ref 135–145)
Total Bilirubin: 0.8 mg/dL (ref 0.3–1.2)
Total Protein: 7 g/dL (ref 6.5–8.1)

## 2022-07-25 LAB — I-STAT BETA HCG BLOOD, ED (MC, WL, AP ONLY): I-stat hCG, quantitative: <5 m[IU]/mL (ref <5)

## 2022-07-25 LAB — LIPASE, BLOOD: Lipase: 38 U/L (ref 11–51)

## 2022-07-25 LAB — CBC
HCT: 38.4 % (ref 36.0–46.0)
Hemoglobin: 13.1 g/dL (ref 12.0–15.0)
MCH: 28.5 pg (ref 26.0–34.0)
MCHC: 34.1 g/dL (ref 30.0–36.0)
MCV: 83.5 fL (ref 80.0–100.0)
Platelets: 292 10*3/uL (ref 150–400)
RBC: 4.6 MIL/uL (ref 3.87–5.11)
RDW: 14 % (ref 11.5–15.5)
WBC: 9.4 10*3/uL (ref 4.0–10.5)
nRBC: 0 % (ref 0.0–0.2)

## 2022-07-25 NOTE — ED Notes (Signed)
Pt left stated she will come back if she feels worse.

## 2022-07-25 NOTE — ED Provider Triage Note (Addendum)
Emergency Medicine Provider Triage Evaluation Note  Andrea Archer , a 21 y.o. female  was evaluated in triage.  Pt complains of abdominal pain, n/v. States that same began this morning and is located throughout her abdomen. No hx of abdominal surgeries. States she just started an antibiotic last night but is unable to tell me which one. No hx abdominal surgeries. Denies any marijuana use  Review of Systems  Positive:  Negative:   Physical Exam  BP 121/86   Pulse 60   Temp 98.5 F (36.9 C) (Oral)   Resp 16   SpO2 100%  Gen:   Awake, no distress   Resp:  Normal effort  MSK:   Moves extremities without difficulty  Other:    Medical Decision Making  Medically screening exam initiated at 5:11 PM.  Appropriate orders placed.  Andrea Archer was informed that the remainder of the evaluation will be completed by another provider, this initial triage assessment does not replace that evaluation, and the importance of remaining in the ED until their evaluation is complete.     Bud Face, PA-C 07/25/22 1712    Mahkai Fangman, Leary Roca, PA-C 07/25/22 1713

## 2022-07-25 NOTE — ED Triage Notes (Signed)
Patient BIB [facility] from home for evaluation of abdominal pain, emesis. Patient reports vomiting and nausea that started this morning and continued after going to work. Patient now complains of headache and fatigue. VSS

## 2022-08-09 ENCOUNTER — Telehealth: Payer: Self-pay

## 2022-08-16 ENCOUNTER — Ambulatory Visit: Payer: Medicaid Other | Admitting: Student

## 2022-09-07 ENCOUNTER — Emergency Department (HOSPITAL_COMMUNITY): Payer: Medicaid Other

## 2022-09-07 ENCOUNTER — Emergency Department (HOSPITAL_COMMUNITY)
Admission: EM | Admit: 2022-09-07 | Discharge: 2022-09-07 | Disposition: A | Discharge status: Home / Self Care | Payer: Medicaid Other | Attending: Emergency Medicine | Admitting: Emergency Medicine

## 2022-09-07 ENCOUNTER — Encounter (HOSPITAL_COMMUNITY): Payer: Self-pay | Admitting: *Deleted

## 2022-09-07 ENCOUNTER — Other Ambulatory Visit: Payer: Self-pay

## 2022-09-07 DIAGNOSIS — R079 Chest pain, unspecified: Secondary | ICD-10-CM | POA: Insufficient documentation

## 2022-09-07 DIAGNOSIS — N9489 Other specified conditions associated with female genital organs and menstrual cycle: Secondary | ICD-10-CM | POA: Diagnosis not present

## 2022-09-07 DIAGNOSIS — R1031 Right lower quadrant pain: Secondary | ICD-10-CM | POA: Diagnosis present

## 2022-09-07 LAB — CBC
HCT: 36.5 % (ref 36.0–46.0)
Hemoglobin: 12.7 g/dL (ref 12.0–15.0)
MCH: 28.7 pg (ref 26.0–34.0)
MCHC: 34.8 g/dL (ref 30.0–36.0)
MCV: 82.4 fL (ref 80.0–100.0)
Platelets: 254 10*3/uL (ref 150–400)
RBC: 4.43 MIL/uL (ref 3.87–5.11)
RDW: 14.1 % (ref 11.5–15.5)
WBC: 11.5 10*3/uL — ABNORMAL HIGH (ref 4.0–10.5)
nRBC: 0 % (ref 0.0–0.2)

## 2022-09-07 LAB — COMPREHENSIVE METABOLIC PANEL
ALT: 10 U/L (ref 0–44)
AST: 14 U/L — ABNORMAL LOW (ref 15–41)
Albumin: 4 g/dL (ref 3.5–5.0)
Alkaline Phosphatase: 45 U/L (ref 38–126)
Anion gap: 7 (ref 5–15)
BUN: 8 mg/dL (ref 6–20)
CO2: 22 mmol/L (ref 22–32)
Calcium: 9.4 mg/dL (ref 8.9–10.3)
Chloride: 109 mmol/L (ref 98–111)
Creatinine, Ser: 1.32 mg/dL — ABNORMAL HIGH (ref 0.44–1.00)
GFR, Estimated: 58 mL/min — ABNORMAL LOW (ref >60)
Glucose, Bld: 99 mg/dL (ref 70–99)
Potassium: 3.3 mmol/L — ABNORMAL LOW (ref 3.5–5.1)
Sodium: 138 mmol/L (ref 135–145)
Total Bilirubin: 0.9 mg/dL (ref 0.3–1.2)
Total Protein: 6.8 g/dL (ref 6.5–8.1)

## 2022-09-07 LAB — URINALYSIS, ROUTINE W REFLEX MICROSCOPIC
Bilirubin Urine: NEGATIVE
Glucose, UA: NEGATIVE mg/dL
Hgb urine dipstick: NEGATIVE
Ketones, ur: NEGATIVE mg/dL
Leukocytes,Ua: NEGATIVE
Nitrite: NEGATIVE
Protein, ur: NEGATIVE mg/dL
Specific Gravity, Urine: 1.014 (ref 1.005–1.030)
pH: 5 (ref 5.0–8.0)

## 2022-09-07 LAB — I-STAT BETA HCG BLOOD, ED (MC, WL, AP ONLY): I-stat hCG, quantitative: <5 m[IU]/mL (ref <5)

## 2022-09-07 LAB — LIPASE, BLOOD: Lipase: 33 U/L (ref 11–51)

## 2022-09-07 LAB — MAGNESIUM: Magnesium: 1.7 mg/dL (ref 1.7–2.4)

## 2022-09-07 LAB — LACTIC ACID, PLASMA: Lactic Acid, Venous: 0.8 mmol/L (ref 0.5–1.9)

## 2022-09-07 LAB — TROPONIN I (HIGH SENSITIVITY): Troponin I (High Sensitivity): <2 ng/L (ref <18)

## 2022-09-07 MED ORDER — SODIUM CHLORIDE 0.9 % IV BOLUS
1000.0000 mL | Freq: Once | INTRAVENOUS | Status: AC
  Administered 2022-09-07: 1000 mL via INTRAVENOUS

## 2022-09-07 MED ORDER — MORPHINE SULFATE (PF) 4 MG/ML IV SOLN
4.0000 mg | Freq: Once | INTRAVENOUS | Status: AC
  Administered 2022-09-07: 4 mg via INTRAVENOUS
  Filled 2022-09-07: qty 1

## 2022-09-07 MED ORDER — ONDANSETRON HCL 4 MG/2ML IJ SOLN
4.0000 mg | Freq: Once | INTRAMUSCULAR | Status: AC
  Administered 2022-09-07: 4 mg via INTRAVENOUS
  Filled 2022-09-07: qty 2

## 2022-09-07 MED ORDER — IOHEXOL 300 MG/ML  SOLN
100.0000 mL | Freq: Once | INTRAMUSCULAR | Status: AC | PRN
  Administered 2022-09-07: 100 mL via INTRAVENOUS

## 2022-09-07 NOTE — ED Provider Notes (Signed)
21 yo F with chief complaints of abdominal pain.  I received the patient in signout from Dr. Sherry Ruffing, briefly the patient had right lower quadrant pain concerning for appendicitis had a CT scan that was negative but was evaluated for possible pelvic pathology.  Also describes some chest pain transiently in conversation and so plan for single troponin and a pelvic ultrasound.  Troponin is negative.  Ultrasound with possible hemorrhagic cyst versus endometrioma.  Results discussed with patient.  We will have her follow-up with her GYN in the office.     Deno Etienne, DO 09/07/22 1642

## 2022-09-07 NOTE — ED Provider Notes (Signed)
[facility] EMERGENCY DEPARTMENT Provider Note   CSN: [id number] Arrival date & time: 09/07/22  0408     History  Chief Complaint  Patient presents with   Chest Pain   Abdominal Pain    Andrea Archer is a 21 y.o. female.  The history is provided by the patient and medical records. No language interpreter was used.  Chest Pain Pain location:  Substernal area Pain quality: dull   Associated symptoms: abdominal pain and nausea   Associated symptoms: no back pain, no cough, no fatigue, no fever, no headache, no numbness, no palpitations, no shortness of breath, no vomiting and no weakness   Abdominal Pain Pain location:  RLQ, periumbilical and suprapubic Pain quality: aching and sharp   Pain radiates to:  Periumbilical region Pain severity:  Severe Onset quality:  Gradual Duration:  2 days Timing:  Constant Progression:  Waxing and waning Chronicity:  New Context: not trauma   Relieved by:  Nothing Worsened by:  Nothing Ineffective treatments:  None tried Associated symptoms: chest pain (resolved) and nausea   Associated symptoms: no chills, no constipation, no cough, no diarrhea, no dysuria, no fatigue, no fever, no shortness of breath, no vaginal bleeding, no vaginal discharge and no vomiting        Home Medications Prior to Admission medications   Medication Sig Start Date End Date Taking? Authorizing Provider  clindamycin (CLEOCIN) 100 MG vaginal suppository Place 1 suppository (100 mg total) vaginally at bedtime. 07/20/22   Woodroe Mode, MD  ibuprofen (ADVIL) 800 MG tablet Take 1 tablet (800 mg total) by mouth every 8 (eight) hours as needed for moderate pain. Patient not taking: Reported on 06/13/2022 05/17/22   Milton Ferguson, MD  metroNIDAZOLE (FLAGYL) 500 MG tablet Take 1 tablet (500 mg total) by mouth 2 (two) times daily. Patient not taking: Reported on 05/16/2022 04/27/22   Chancy Milroy, MD  metroNIDAZOLE (FLAGYL) 500 MG tablet Take 1  tablet (500 mg total) by mouth 2 (two) times daily. 06/18/22   Chancy Milroy, MD      Allergies    Mushroom extract complex and Pineapple    Review of Systems   Review of Systems  Constitutional:  Negative for chills, fatigue and fever.  HENT:  Negative for congestion.   Eyes:  Negative for visual disturbance.  Respiratory:  Negative for cough, chest tightness and shortness of breath.   Cardiovascular:  Positive for chest pain (resolved). Negative for palpitations.  Gastrointestinal:  Positive for abdominal pain and nausea. Negative for abdominal distention, constipation, diarrhea and vomiting.  Genitourinary:  Negative for dysuria, flank pain, frequency, pelvic pain, vaginal bleeding, vaginal discharge and vaginal pain.  Musculoskeletal:  Negative for back pain, neck pain and neck stiffness.  Skin:  Negative for rash and wound.  Neurological:  Negative for weakness, light-headedness, numbness and headaches.  Psychiatric/Behavioral:  Negative for agitation and confusion.   All other systems reviewed and are negative.   Physical Exam Updated Vital Signs BP (!) 131/96 (BP Location: Right Arm)   Pulse 90   Temp 98 F (36.7 C) (Oral)   Resp 16   LMP 08/15/2022   SpO2 98%  Physical Exam Vitals and nursing note reviewed. Exam conducted with a chaperone present.  Constitutional:      General: She is not in acute distress.    Appearance: She is well-developed. She is not ill-appearing, toxic-appearing or diaphoretic.  HENT:     Head: Normocephalic and atraumatic.  Eyes:  Extraocular Movements: Extraocular movements intact.     Conjunctiva/sclera: Conjunctivae normal.     Pupils: Pupils are equal, round, and reactive to light.  Cardiovascular:     Rate and Rhythm: Normal rate and regular rhythm.     Heart sounds: No murmur heard. Pulmonary:     Effort: Pulmonary effort is normal. No respiratory distress.     Breath sounds: Normal breath sounds. No wheezing, rhonchi or  rales.  Chest:     Chest wall: No tenderness.  Abdominal:     Palpations: Abdomen is soft.     Tenderness: There is no abdominal tenderness.  Genitourinary:    General: Normal vulva.     Labia:        Right: No tenderness.        Left: No tenderness.      Vagina: No tenderness.     Cervix: No cervical motion tenderness or discharge.     Uterus: Not tender.      Adnexa:        Right: Tenderness present.        Left: No tenderness.    Musculoskeletal:        General: No swelling.     Cervical back: Neck supple.  Skin:    General: Skin is warm and dry.     Capillary Refill: Capillary refill takes less than 2 seconds.     Findings: No erythema or rash.  Neurological:     General: No focal deficit present.     Mental Status: She is alert.  Psychiatric:        Mood and Affect: Mood normal.     ED Results / Procedures / Treatments   Labs (all labs ordered are listed, but only abnormal results are displayed) Labs Reviewed  COMPREHENSIVE METABOLIC PANEL - Abnormal; Notable for the following components:      Result Value   Potassium 3.3 (*)    Creatinine, Ser 1.32 (*)    AST 14 (*)    GFR, Estimated 58 (*)    All other components within normal limits  CBC - Abnormal; Notable for the following components:   WBC 11.5 (*)    All other components within normal limits  URINALYSIS, ROUTINE W REFLEX MICROSCOPIC - Abnormal; Notable for the following components:   APPearance HAZY (*)    All other components within normal limits  LIPASE, BLOOD  LACTIC ACID, PLASMA  LACTIC ACID, PLASMA  MAGNESIUM  I-STAT BETA HCG BLOOD, ED ([facility], [facility], [facility] ONLY)  TROPONIN I (HIGH SENSITIVITY)    EKG EKG Interpretation  Date/Time:  Friday September 07 2022 04:24:44 EDT Ventricular Rate:  106 PR Interval:  146 QRS Duration: 86 QT Interval:  350 QTC Calculation: 464 R Axis:   85 Text Interpretation: Sinus tachycardia T wave abnormality, consider inferior ischemia Abnormal ECG When compared with  ECG of 25-Jul-2022 16:14, PREVIOUS ECG IS PRESENT similar to ECG in feb 2021, when compared more recently, t wave inversions in leads 3, AVF, V3. No STEMI Confirmed by Antony Blackbird [phone]) on 09/07/2022 11:45:56 AM  Radiology No results found.  Procedures Procedures    Medications Ordered in ED Medications  ondansetron (ZOFRAN) injection 4 mg (4 mg Intravenous Given 09/07/22 1301)  morphine (PF) 4 MG/ML injection 4 mg (4 mg Intravenous Given 09/07/22 1302)  sodium chloride 0.9 % bolus 1,000 mL (0 mLs Intravenous Stopped 09/07/22 1507)  iohexol (OMNIPAQUE) 300 MG/ML solution 100 mL (100 mLs Intravenous Contrast Given 09/07/22 1251)  ED Course/ Medical Decision Making/ A&P                           Medical Decision Making Amount and/or Complexity of Data Reviewed Labs: ordered. Radiology: ordered.  Risk Prescription drug management.    Cherity Blickenstaff is a 21 y.o. female with a past medical history significant for anxiety, depression, sickle cell trait, hypertension, and previous dysfunctional uterine bleeding who presents with right-sided abdominal pain.  According to patient, since yesterday she started having pain in her right lower quadrant that raise to her umbilicus.  She reports some nausea but no vomiting.  She reports no constipation, diarrhea, or urinary changes.  Still has menstrual cycles and denies any changes with them.  Denies any trauma or skin changes.  Denies any back pain.  Denies any chest pain, shortness breath, fevers, chills, or cough.  Reports the pain is moderate to severe and is from in the right lower quadrant.  She denies any pelvic or lower pain and denies vaginal symptoms.  On exam, lungs clear and chest nontender.  Abdomen is quite tender in the right lower quadrant and periumbilical area.  Rest of abdomen nontender.  Normal bowel sounds.  Exam otherwise reassuring.  Given her 2 days of right lower quadrant abdominal pain with her nausea and symptoms, we will  get imaging and work-up to rule out appendicitis or other causes of right lower quadrant abdominal pain.  Patient had labs in triage that did show evidence of leukocytosis that was mild 11.5.  Otherwise mild hypokalemia and a creatinine of 1.3.  Urinalysis does not show infection and she is not pregnant.  Lipase normal.  She was also complained intermittently of some chest discomfort that has resolved but EKG did show some T wave inversions that appear different than prior.  We will get troponins and chest x-ray.  CT scan returned showing no evidence of appendicitis or other abnormality.  No pelvic abnormality seen.  Chest x-ray reassuring.  Anticipate discussion and shared decision-making conversation about work-up.  Given her lack of pelvic pain have less suspicion for a ovarian cause however we will offer pelvic exam as well.   CT scan returned reassuring with no appendicitis and no other pelvic abnormality.  Labs show mild leukocytosis.  Urinalysis did not show convincing evidence of infection.  Slight increase in creatinine.  Other work-up reassuring thus far.  Due to her continued right lower quadrant abdominal pain, pelvic exam was offered and she wants to do that.  We will get pelvic and swabs and pelvic ultrasound as well.  Pelvic exam did have some tenderness in the right adnexa but otherwise there is no discharge or cervical motion tenderness seen.  Pelvic exam otherwise reassuring.  Swabs were collected and ultrasound ordered.  Care transferred to oncoming team to await results of ultrasound, troponin and reassessment.              Final Clinical Impression(s) / ED Diagnoses Final diagnoses:  RLQ abdominal pain    Clinical Impression: 1. RLQ abdominal pain     Disposition:  Care transferred to oncoming team to await results of ultrasound, troponin and reassessment.       This note was prepared with assistance of Systems analyst. Occasional  wrong-word or sound-a-like substitutions may have occurred due to the inherent limitations of voice recognition software.      London Nonaka, Gwenyth Allegra, MD 09/07/22 [phone]

## 2022-09-07 NOTE — ED Triage Notes (Signed)
Pt started having R sided abd pain radiating to belly button; also reports Chest pain with racing heart that started today.

## 2022-09-07 NOTE — ED Notes (Signed)
Patient transported to Ultrasound 

## 2022-09-07 NOTE — Discharge Instructions (Addendum)
Your CT scan did not show any obvious acute pathology however the ultrasound there is some concern that you had either a cyst or something called an endometrioma.  The radiologist recommended a repeat ultrasound in 6 to 12 weeks preferably right after your menstrual cycle.  Please follow-up with OB/GYN.  If you do not have 1 I have attached information for our local practice.  Please return for worsening pain fever or inability eat or drink.  Your swabs were sent off for testing for sexually transmitted diseases.  These typically take at least 48 hours to come back.

## 2022-09-13 ENCOUNTER — Ambulatory Visit: Payer: Medicaid Other

## 2022-09-13 ENCOUNTER — Other Ambulatory Visit (HOSPITAL_COMMUNITY)
Admission: RE | Admit: 2022-09-13 | Discharge: 2022-09-13 | Disposition: A | Discharge status: Home / Self Care | Payer: Medicaid Other | Source: Ambulatory Visit | Attending: Obstetrics and Gynecology | Admitting: Obstetrics and Gynecology

## 2022-09-13 VITALS — BP 130/88 | HR 90 | Wt 140.0 lb

## 2022-09-13 DIAGNOSIS — Z113 Encounter for screening for infections with a predominantly sexual mode of transmission: Secondary | ICD-10-CM | POA: Insufficient documentation

## 2022-09-13 DIAGNOSIS — N898 Other specified noninflammatory disorders of vagina: Secondary | ICD-10-CM

## 2022-09-13 NOTE — Progress Notes (Addendum)
SUBJECTIVE:  21 y.o. female who desires a STI screen. Reports abnormal vaginal discharge, denies bleeding or significant pelvic pain. No UTI symptoms. Denies history of known exposure to STD.  Patient's last menstrual period was 08/15/2022.  OBJECTIVE:  She appears well.   ASSESSMENT:  STI Screen   PLAN:  Pt offered STI blood screening-ordered GC, chlamydia, and trichomonas probe sent to lab.  Treatment: To be determined once lab results are received.  Pt follow up as needed.

## 2022-09-14 LAB — CERVICOVAGINAL ANCILLARY ONLY
Bacterial Vaginitis (gardnerella): POSITIVE — AB
Candida Glabrata: NEGATIVE
Candida Vaginitis: NEGATIVE
Chlamydia: NEGATIVE
Comment: NEGATIVE
Comment: NEGATIVE
Comment: NEGATIVE
Comment: NEGATIVE
Comment: NEGATIVE
Comment: NORMAL
Neisseria Gonorrhea: NEGATIVE
Trichomonas: NEGATIVE

## 2022-09-14 LAB — RPR: RPR Ser Ql: NONREACTIVE

## 2022-09-14 LAB — HEPATITIS B SURFACE ANTIGEN: Hepatitis B Surface Ag: NEGATIVE

## 2022-09-14 LAB — HEPATITIS C ANTIBODY: Hep C Virus Ab: NONREACTIVE

## 2022-09-14 LAB — HIV ANTIBODY (ROUTINE TESTING W REFLEX): HIV Screen 4th Generation wRfx: NONREACTIVE

## 2022-09-17 ENCOUNTER — Other Ambulatory Visit: Payer: Self-pay

## 2022-09-17 DIAGNOSIS — N760 Acute vaginitis: Secondary | ICD-10-CM

## 2022-09-17 MED ORDER — METRONIDAZOLE 500 MG PO TABS: 500.0000 mg | ORAL_TABLET | Freq: Two times a day (BID) | ORAL | 0 refills | Status: DC

## 2022-09-17 MED ORDER — METRONIDAZOLE 0.75 % VA GEL: 1.0000 | Freq: Two times a day (BID) | VAGINAL | 0 refills | Status: DC

## 2022-09-18 ENCOUNTER — Other Ambulatory Visit: Payer: Self-pay | Admitting: Family Medicine

## 2022-09-25 ENCOUNTER — Other Ambulatory Visit: Payer: Self-pay | Admitting: Emergency Medicine

## 2022-09-25 DIAGNOSIS — B3731 Acute candidiasis of vulva and vagina: Secondary | ICD-10-CM

## 2022-09-25 MED ORDER — FLUCONAZOLE 150 MG PO TABS: 150.0000 mg | ORAL_TABLET | Freq: Once | ORAL | 0 refills | Status: AC

## 2022-09-25 NOTE — Progress Notes (Signed)
Rx sent for yeast, per protocol. Patient reports itching, irritation following completion of metronidazole.

## 2022-10-08 ENCOUNTER — Ambulatory Visit: Payer: Medicaid Other | Admitting: Certified Nurse Midwife

## 2022-10-08 DIAGNOSIS — B3731 Acute candidiasis of vulva and vagina: Secondary | ICD-10-CM

## 2022-10-08 DIAGNOSIS — Z3009 Encounter for other general counseling and advice on contraception: Secondary | ICD-10-CM

## 2022-10-08 DIAGNOSIS — Z01419 Encounter for gynecological examination (general) (routine) without abnormal findings: Secondary | ICD-10-CM

## 2022-11-09 ENCOUNTER — Ambulatory Visit (INDEPENDENT_AMBULATORY_CARE_PROVIDER_SITE_OTHER): Payer: Medicaid Other | Admitting: Obstetrics

## 2022-11-09 ENCOUNTER — Encounter: Payer: Self-pay | Admitting: Obstetrics

## 2022-11-09 ENCOUNTER — Other Ambulatory Visit (HOSPITAL_COMMUNITY)
Admission: RE | Admit: 2022-11-09 | Discharge: 2022-11-09 | Disposition: A | Discharge status: Home / Self Care | Payer: Medicaid Other | Source: Ambulatory Visit | Attending: Obstetrics | Admitting: Obstetrics

## 2022-11-09 VITALS — BP 100/66 | Ht 67.0 in | Wt 143.5 lb

## 2022-11-09 DIAGNOSIS — N898 Other specified noninflammatory disorders of vagina: Secondary | ICD-10-CM | POA: Insufficient documentation

## 2022-11-09 DIAGNOSIS — Z01419 Encounter for gynecological examination (general) (routine) without abnormal findings: Secondary | ICD-10-CM

## 2022-11-09 DIAGNOSIS — B9689 Other specified bacterial agents as the cause of diseases classified elsewhere: Secondary | ICD-10-CM

## 2022-11-09 DIAGNOSIS — Z113 Encounter for screening for infections with a predominantly sexual mode of transmission: Secondary | ICD-10-CM

## 2022-11-09 DIAGNOSIS — N760 Acute vaginitis: Secondary | ICD-10-CM | POA: Diagnosis not present

## 2022-11-09 MED ORDER — METRONIDAZOLE 500 MG PO TABS: 500.0000 mg | ORAL_TABLET | Freq: Two times a day (BID) | ORAL | 2 refills | Status: DC

## 2022-11-09 NOTE — Progress Notes (Signed)
Subjective:        Andrea Archer is a 21 y.o. female here for a routine exam.  Current complaints: Malodorous vaginal discharge.    Personal health questionnaire:  Is patient [name], have a family history of breast and/or ovarian cancer: no Is there a family history of uterine cancer diagnosed at age < 36, gastrointestinal cancer, urinary tract cancer, family member who is a Field seismologist syndrome-associated carrier: no Is the patient overweight and hypertensive, family history of diabetes, personal history of gestational diabetes, preeclampsia or PCOS: no Is patient over 89, have PCOS,  family history of premature CHD under age 39, diabetes, smoke, have hypertension or peripheral artery disease:  no At any time, has a partner hit, kicked or otherwise hurt or frightened you?: no Over the past 2 weeks, have you felt down, depressed or hopeless?: no Over the past 2 weeks, have you felt little interest or pleasure in doing things?:no   Gynecologic History Patient's last menstrual period was 11/06/2022. Contraception: none Last Pap: 2022. Results were: normal Last mammogram: n/a. Results were: n/az  Obstetric History OB History  Gravida Para Term Preterm AB Living  '1 1 1 '$ 0 0 1  SAB IAB Ectopic Multiple Live Births  0 0 0 0 1    # Outcome Date GA Lbr Len/2nd Weight Sex Delivery Anes PTL Lv  1 Term 11/16/19 [id number] / 02:57 7 lb 3.9 oz (3.286 kg) F Vag-Spont EPI  LIV    Past Medical History:  Diagnosis Date   Anxiety    Depression    Hypertension    with pregnancy   Pregnant    Sickle cell trait ([facility])    Sickle cell trait ([facility]     Past Surgical History:  Procedure Laterality Date   FINGER SURGERY Left    Pinky Finger   FRACTURE SURGERY  01/09/2019   left pinky finger     Current Outpatient Medications:    metroNIDAZOLE (FLAGYL) 500 MG tablet, Take 1 tablet (500 mg total) by mouth 2 (two) times daily., Disp: 14 tablet, Rfl: 2   clindamycin (CLEOCIN) 100 MG  vaginal suppository, Place 1 suppository (100 mg total) vaginally at bedtime. (Patient not taking: Reported on 09/13/2022), Disp: 3 suppository, Rfl: 0   ibuprofen (ADVIL) 800 MG tablet, Take 1 tablet (800 mg total) by mouth every 8 (eight) hours as needed for moderate pain. (Patient not taking: Reported on 06/13/2022), Disp: 21 tablet, Rfl: 1   metroNIDAZOLE (FLAGYL) 500 MG tablet, Take 1 tablet (500 mg total) by mouth 2 (two) times daily. (Patient not taking: Reported on 05/16/2022), Disp: 14 tablet, Rfl: 0   metroNIDAZOLE (FLAGYL) 500 MG tablet, Take 1 tablet (500 mg total) by mouth 2 (two) times daily. (Patient not taking: Reported on 09/13/2022), Disp: 14 tablet, Rfl: 0   metroNIDAZOLE (FLAGYL) 500 MG tablet, Take 1 tablet (500 mg total) by mouth 2 (two) times daily. (Patient not taking: Reported on 11/09/2022), Disp: 14 tablet, Rfl: 0   metroNIDAZOLE (METROGEL VAGINAL) 0.75 % vaginal gel, Place 1 Applicatorful vaginally 2 (two) times daily. (Patient not taking: Reported on 11/09/2022), Disp: 70 g, Rfl: 0 Allergies  Allergen Reactions   Mushroom Extract Complex Rash   Pineapple Rash    Social History   Tobacco Use   Smoking status: Former    Types: Cigars    Passive exposure: Past   Smokeless tobacco: Never  Substance Use Topics   Alcohol use: Yes    Comment: Occ, more recent  due to stress    Family History  Problem Relation Age of Onset   Hypertension Mother    Sickle cell anemia Father    Diabetes Maternal Grandmother    Hypertension Maternal Grandmother    Heart disease Maternal Grandmother    Fibromyalgia Maternal Grandmother    Hypertension Maternal Grandfather    Pancreatic cancer Maternal Grandfather    Hypertension Paternal Grandmother    Hypertension Paternal Grandfather    Colon cancer Neg Hx    Stomach cancer Neg Hx    Esophageal cancer Neg Hx    Rectal cancer Neg Hx       Review of Systems  Constitutional: negative for fatigue and weight loss Respiratory:  negative for cough and wheezing Cardiovascular: negative for chest pain, fatigue and palpitations Gastrointestinal: negative for abdominal pain and change in bowel habits Musculoskeletal:negative for myalgias Neurological: negative for gait problems and tremors Behavioral/Psych: negative for abusive relationship, depression Endocrine: negative for temperature intolerance    Genitourinary:negative for abnormal menstrual periods, genital lesions, hot flashes, sexual problems and vaginal discharge Integument/breast: negative for breast lump, breast tenderness, nipple discharge and skin lesion(s)    Objective:       BP 100/66   Ht '5\' 7"'$  (1.702 m)   Wt 143 lb 8 oz (65.1 kg)   LMP 11/06/2022   BMI 22.48 kg/m  General:   alert  Skin:   no rash or abnormalities  Lungs:   clear to auscultation bilaterally  Heart:   regular rate and rhythm, S1, S2 normal, no murmur, click, rub or gallop  Breasts:   normal without suspicious masses, skin or nipple changes or axillary nodes  Abdomen:  normal findings: no organomegaly, soft, non-tender and no hernia  Pelvis:  External genitalia: normal general appearance Urinary system: urethral meatus normal and bladder without fullness, nontender Vaginal: normal without tenderness, induration or masses Cervix: normal appearance Adnexa: normal bimanual exam Uterus: anteverted and non-tender, normal size   Lab Review Urine pregnancy test Labs reviewed yes Radiologic studies reviewed no  I have spent a total of 20 minutes of face-to-face time, excluding clinical staff time, reviewing notes and preparing to see patient, ordering tests and/or medications, and counseling the patient.   Assessment:    1. Encounter for gynecological examination with Papanicolaou smear of cervix Rx: - Cytology - PAP( [place])  2. Vaginal discharge Rx: - Cervicovaginal ancillary only  3. Screen for STD (sexually transmitted disease) Rx: - Hepatitis B surface  antigen - Hepatitis C antibody - RPR - HIV Antibody (routine testing w rflx)  4. BV (bacterial vaginosis) Rx: - metroNIDAZOLE (FLAGYL) 500 MG tablet; Take 1 tablet (500 mg total) by mouth 2 (two) times daily.  Dispense: 14 tablet; Refill: 2    Plan:    Education reviewed: calcium supplements, depression evaluation, low fat, low cholesterol diet, safe sex/STD prevention, self breast exams, and weight bearing exercise. Contraception: none. Follow up in: 1 year.   Meds ordered this encounter  Medications   metroNIDAZOLE (FLAGYL) 500 MG tablet    Sig: Take 1 tablet (500 mg total) by mouth 2 (two) times daily.    Dispense:  14 tablet    Refill:  2   Orders Placed This Encounter  Procedures   Hepatitis B surface antigen   Hepatitis C antibody   RPR   HIV Antibody (routine testing w rflx)     Shelly Bombard, MD 11/09/2022 4:19 PM

## 2022-11-09 NOTE — Progress Notes (Signed)
Pt presents for annual, pap, and all STD testing.  She reports melodious vaginal discharge.  Negative pap 04/25/21

## 2022-11-10 LAB — RPR: RPR Ser Ql: NONREACTIVE

## 2022-11-10 LAB — HEPATITIS B SURFACE ANTIGEN: Hepatitis B Surface Ag: NEGATIVE

## 2022-11-10 LAB — HIV ANTIBODY (ROUTINE TESTING W REFLEX): HIV Screen 4th Generation wRfx: NONREACTIVE

## 2022-11-10 LAB — HEPATITIS C ANTIBODY: Hep C Virus Ab: NONREACTIVE

## 2022-11-12 LAB — CERVICOVAGINAL ANCILLARY ONLY
Bacterial Vaginitis (gardnerella): POSITIVE — AB
Candida Glabrata: NEGATIVE
Candida Vaginitis: NEGATIVE
Chlamydia: NEGATIVE
Comment: NEGATIVE
Comment: NEGATIVE
Comment: NEGATIVE
Comment: NEGATIVE
Comment: NEGATIVE
Comment: NORMAL
Neisseria Gonorrhea: NEGATIVE
Trichomonas: NEGATIVE

## 2022-11-13 ENCOUNTER — Other Ambulatory Visit: Payer: Self-pay | Admitting: Obstetrics

## 2022-11-13 ENCOUNTER — Ambulatory Visit: Payer: Medicaid Other | Admitting: Gastroenterology

## 2022-11-13 DIAGNOSIS — B9689 Other specified bacterial agents as the cause of diseases classified elsewhere: Secondary | ICD-10-CM

## 2022-11-13 MED ORDER — METRONIDAZOLE 500 MG PO TABS: 500.0000 mg | ORAL_TABLET | Freq: Two times a day (BID) | ORAL | 2 refills | Status: DC

## 2022-11-13 NOTE — Progress Notes (Signed)
Pt has seen results in MyChart and RX Flagyl already sent on day of care.MyChart message with education on BV sent.

## 2022-11-14 LAB — CYTOLOGY - PAP
Diagnosis: NEGATIVE
Diagnosis: REACTIVE

## 2022-11-17 ENCOUNTER — Encounter (HOSPITAL_COMMUNITY): Payer: Self-pay | Admitting: *Deleted

## 2022-11-17 ENCOUNTER — Ambulatory Visit (HOSPITAL_COMMUNITY)
Admission: EM | Admit: 2022-11-17 | Discharge: 2022-11-17 | Disposition: A | Discharge status: Home / Self Care | Payer: Medicaid Other | Attending: Family Medicine | Admitting: Family Medicine

## 2022-11-17 DIAGNOSIS — N760 Acute vaginitis: Secondary | ICD-10-CM | POA: Diagnosis not present

## 2022-11-17 DIAGNOSIS — L243 Irritant contact dermatitis due to cosmetics: Secondary | ICD-10-CM | POA: Diagnosis not present

## 2022-11-17 DIAGNOSIS — B9689 Other specified bacterial agents as the cause of diseases classified elsewhere: Secondary | ICD-10-CM | POA: Diagnosis not present

## 2022-11-17 DIAGNOSIS — T7840XA Allergy, unspecified, initial encounter: Secondary | ICD-10-CM

## 2022-11-17 HISTORY — DX: Other specified bacterial agents as the cause of diseases classified elsewhere: B96.89

## 2022-11-17 HISTORY — DX: Acute vaginitis: N76.0

## 2022-11-17 MED ORDER — PREDNISONE 20 MG PO TABS: 40.0000 mg | ORAL_TABLET | Freq: Every day | ORAL | 0 refills | Status: DC

## 2022-11-17 MED ORDER — TRIAMCINOLONE ACETONIDE 0.1 % EX CREA: 1.0000 | TOPICAL_CREAM | Freq: Two times a day (BID) | CUTANEOUS | 0 refills | Status: DC

## 2022-11-17 NOTE — ED Triage Notes (Signed)
C/O swelling and bumps around her scalp. Pt reports her hair was wash henna shampoo.

## 2022-11-17 NOTE — ED Triage Notes (Signed)
C/O swelling to scalp and along hairline since having henna in hair 4 days ago. C/O worsening pain and swelling, now c/o swelling onto bilat ears, bilat external neck.  Has taken OTC allergy med and started applying aloe without relief.

## 2022-11-18 ENCOUNTER — Telehealth (HOSPITAL_COMMUNITY): Payer: Self-pay

## 2022-11-18 MED ORDER — TRIAMCINOLONE ACETONIDE 0.1 % EX CREA: 1.0000 | TOPICAL_CREAM | Freq: Two times a day (BID) | CUTANEOUS | 0 refills | Status: DC

## 2022-11-18 MED ORDER — PREDNISONE 20 MG PO TABS: 40.0000 mg | ORAL_TABLET | Freq: Every day | ORAL | 0 refills | Status: DC

## 2022-11-21 NOTE — ED Provider Notes (Signed)
[place]   [id number] 11/17/22 Arrival Time: 9509  ASSESSMENT & PLAN:  1. Allergic reaction, initial encounter   2. Irritant contact dermatitis due to cosmetics   3. BV (bacterial vaginosis)    No signs of bacterial infection. Discussed typical duration of likely viral illness. OTC symptom care as needed.   Meds ordered this encounter  Medications   predniSONE (DELTASONE) 20 MG tablet    Sig: Take 2 tablets (40 mg total) by mouth daily.    Dispense:  14 tablet    Refill:  0   triamcinolone cream (KENALOG) 0.1 %    Sig: Apply 1 Application topically 2 (two) times daily.    Dispense:  15 g    Refill:  0       Follow-up Information     [facility] at [facility].   Specialty: Urgent Care Why: If worsening or failing to improve as anticipated. Contact information: [place] [id number] [phone]                Reviewed expectations re: course of current medical issues. Questions answered. Outlined signs and symptoms indicating need for more acute intervention. Understanding verbalized. After Visit Summary given.   SUBJECTIVE: History from: Patient. Andrea Archer is a 21 y.o. female. Reports: C/O swelling to scalp and along hairline since having henna in hair 4 days ago. C/O worsening pain and swelling, now c/o swelling onto bilat ears, bilat external neck.  Has taken OTC allergy med and started applying aloe without relief.  C/O swelling and bumps around her scalp. Pt reports her hair was wash henna shampoo. Also thinks she has BV; watery vag d/c; h/o similar.  OBJECTIVE:  Vitals:   11/17/22 1712 11/17/22 1800  BP: 123/70 121/78  Pulse: 99   Resp: 16 18  Temp: 98.7 F (37.1 C) 98.9 F (37.2 C)  TempSrc: Oral Oral  SpO2: 100% 98%    General appearance: alert; no distress Eyes: PERRLA; EOMI; conjunctiva normal HENT: ; AT; without nasal congestion Neck: supple  Lungs: speaks full  sentences without difficulty; unlabored Extremities: no edema Skin: warm and dry; irritation/inflammation of skin at hairline Neurologic: normal gait Psychological: alert and cooperative; normal mood and affect   Allergies  Allergen Reactions   Other     Henna - rash   Mushroom Extract Complex Rash   Pineapple Rash    Past Medical History:  Diagnosis Date   Anxiety    Bacterial vaginitis    Depression    Hypertension    with pregnancy   Pregnant    Sickle cell trait (HCC)    Sickle cell trait (HCC)    Social History   Socioeconomic History   Marital status: Significant Other    Spouse name: Not on file   Number of children: Not on file   Years of education: Not on file   Highest education level: Not on file  Occupational History   Not on file  Tobacco Use   Smoking status: Former    Types: Cigars    Passive exposure: Past   Smokeless tobacco: Never  Vaping Use   Vaping Use: Never used  Substance and Sexual Activity   Alcohol use: Not Currently   Drug use: No   Sexual activity: Yes    Partners: Male    Birth control/protection: Condom  Other Topics Concern   Not on file  Social History Narrative   Not on file   Social  Determinants of Health   Financial Resource Strain: Low Risk  (11/14/2019)   Overall Financial Resource Strain (CARDIA)    Difficulty of Paying Living Expenses: Not hard at all  Food Insecurity: Unknown (11/14/2019)   Hunger Vital Sign    Worried About Running Out of Food in the Last Year: Patient refused    [place] in the Last Year: Patient refused  Transportation Needs: Unknown (11/14/2019)   PRAPARE - Hydrologist (Medical): Patient refused    Lack of Transportation (Non-Medical): Patient refused  Physical Activity: Unknown (11/14/2019)   Exercise Vital Sign    Days of Exercise per Week: Patient refused    Minutes of Exercise per Session: Patient refused  Stress: Not on file  Social  Connections: Not on file  Intimate Partner Violence: Not At Risk (11/14/2019)   Humiliation, Afraid, Rape, and Kick questionnaire    Fear of Current or Ex-Partner: No    Emotionally Abused: No    Physically Abused: No    Sexually Abused: No   Family History  Problem Relation Age of Onset   Hypertension Mother    Sickle cell anemia Father    Diabetes Maternal Grandmother    Hypertension Maternal Grandmother    Heart disease Maternal Grandmother    Fibromyalgia Maternal Grandmother    Hypertension Maternal Grandfather    Pancreatic cancer Maternal Grandfather    Hypertension Paternal Grandmother    Hypertension Paternal Grandfather    Colon cancer Neg Hx    Stomach cancer Neg Hx    Esophageal cancer Neg Hx    Rectal cancer Neg Hx    Past Surgical History:  Procedure Laterality Date   FINGER SURGERY Left    Pinky Finger   FRACTURE SURGERY  01/09/2019   left pinky finger     Vanessa Kick, MD 11/21/22 [phone]

## 2022-12-11 ENCOUNTER — Other Ambulatory Visit: Payer: Self-pay | Admitting: Emergency Medicine

## 2022-12-11 MED ORDER — FLUCONAZOLE 150 MG PO TABS: 150.0000 mg | ORAL_TABLET | Freq: Once | ORAL | 0 refills | Status: AC

## 2022-12-17 ENCOUNTER — Ambulatory Visit (INDEPENDENT_AMBULATORY_CARE_PROVIDER_SITE_OTHER): Payer: Medicaid Other

## 2022-12-17 ENCOUNTER — Other Ambulatory Visit (HOSPITAL_COMMUNITY)
Admission: RE | Admit: 2022-12-17 | Discharge: 2022-12-17 | Disposition: A | Discharge status: Home / Self Care | Payer: Medicaid Other | Source: Ambulatory Visit | Attending: Obstetrics and Gynecology | Admitting: Obstetrics and Gynecology

## 2022-12-17 DIAGNOSIS — N898 Other specified noninflammatory disorders of vagina: Secondary | ICD-10-CM | POA: Diagnosis present

## 2022-12-17 NOTE — Progress Notes (Signed)
..  SUBJECTIVE:  21 y.o. female complains of white vaginal discharge with itching/irritation for 4 day(s). Denies abnormal vaginal bleeding or significant pelvic pain or fever. No UTI symptoms. Denies history of known exposure to STD.  No LMP recorded.  OBJECTIVE:  She appears well, afebrile. Urine dipstick: not done.  ASSESSMENT:  Vaginal discharge Vaginal itching/irritation   PLAN:  GC, chlamydia, trichomonas, BVAG, CVAG probe sent to lab. Treatment: To be determined once lab results are received ROV prn if symptoms persist or worsen.

## 2022-12-18 LAB — CERVICOVAGINAL ANCILLARY ONLY
Bacterial Vaginitis (gardnerella): NEGATIVE
Candida Glabrata: NEGATIVE
Candida Vaginitis: NEGATIVE
Chlamydia: NEGATIVE
Comment: NEGATIVE
Comment: NEGATIVE
Comment: NEGATIVE
Comment: NEGATIVE
Comment: NEGATIVE
Comment: NORMAL
Neisseria Gonorrhea: NEGATIVE
Trichomonas: NEGATIVE

## 2022-12-26 ENCOUNTER — Emergency Department (HOSPITAL_COMMUNITY)
Admission: EM | Admit: 2022-12-26 | Discharge: 2022-12-27 | Discharge status: Against Medical Advice | Payer: Medicaid Other
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
## 2022-12-28 ENCOUNTER — Ambulatory Visit (HOSPITAL_COMMUNITY)
Admission: EM | Admit: 2022-12-28 | Discharge: 2022-12-28 | Disposition: A | Discharge status: Home / Self Care | Payer: Medicaid Other

## 2022-12-28 ENCOUNTER — Emergency Department (HOSPITAL_COMMUNITY)
Admission: EM | Admit: 2022-12-28 | Discharge: 2022-12-28 | Disposition: A | Discharge status: Home / Self Care | Payer: Medicaid Other | Attending: Emergency Medicine | Admitting: Emergency Medicine

## 2022-12-28 ENCOUNTER — Emergency Department (HOSPITAL_COMMUNITY): Payer: Medicaid Other

## 2022-12-28 ENCOUNTER — Other Ambulatory Visit: Payer: Self-pay

## 2022-12-28 DIAGNOSIS — M25512 Pain in left shoulder: Secondary | ICD-10-CM | POA: Insufficient documentation

## 2022-12-28 DIAGNOSIS — M542 Cervicalgia: Secondary | ICD-10-CM | POA: Diagnosis present

## 2022-12-28 DIAGNOSIS — M5416 Radiculopathy, lumbar region: Secondary | ICD-10-CM

## 2022-12-28 DIAGNOSIS — Y92410 Unspecified street and highway as the place of occurrence of the external cause: Secondary | ICD-10-CM | POA: Diagnosis not present

## 2022-12-28 DIAGNOSIS — M545 Low back pain, unspecified: Secondary | ICD-10-CM | POA: Diagnosis not present

## 2022-12-28 DIAGNOSIS — R109 Unspecified abdominal pain: Secondary | ICD-10-CM | POA: Diagnosis not present

## 2022-12-28 DIAGNOSIS — M5126 Other intervertebral disc displacement, lumbar region: Secondary | ICD-10-CM

## 2022-12-28 DIAGNOSIS — M546 Pain in thoracic spine: Secondary | ICD-10-CM | POA: Insufficient documentation

## 2022-12-28 LAB — COMPREHENSIVE METABOLIC PANEL
ALT: 12 U/L (ref 0–44)
AST: 16 U/L (ref 15–41)
Albumin: 3.7 g/dL (ref 3.5–5.0)
Alkaline Phosphatase: 48 U/L (ref 38–126)
Anion gap: 8 (ref 5–15)
BUN: 6 mg/dL (ref 6–20)
CO2: 24 mmol/L (ref 22–32)
Calcium: 9.2 mg/dL (ref 8.9–10.3)
Chloride: 105 mmol/L (ref 98–111)
Creatinine, Ser: 0.99 mg/dL (ref 0.44–1.00)
GFR, Estimated: >60 mL/min (ref >60)
Glucose, Bld: 91 mg/dL (ref 70–99)
Potassium: 4.1 mmol/L (ref 3.5–5.1)
Sodium: 137 mmol/L (ref 135–145)
Total Bilirubin: 0.7 mg/dL (ref 0.3–1.2)
Total Protein: 6.8 g/dL (ref 6.5–8.1)

## 2022-12-28 LAB — CBC WITH DIFFERENTIAL/PLATELET
Abs Immature Granulocytes: 0.02 10*3/uL (ref 0.00–0.07)
Basophils Absolute: 0.1 10*3/uL (ref 0.0–0.1)
Basophils Relative: 1 %
Eosinophils Absolute: 0.6 10*3/uL — ABNORMAL HIGH (ref 0.0–0.5)
Eosinophils Relative: 8 %
HCT: 40.5 % (ref 36.0–46.0)
Hemoglobin: 13.5 g/dL (ref 12.0–15.0)
Immature Granulocytes: 0 %
Lymphocytes Relative: 33 %
Lymphs Abs: 2.3 10*3/uL (ref 0.7–4.0)
MCH: 27.8 pg (ref 26.0–34.0)
MCHC: 33.3 g/dL (ref 30.0–36.0)
MCV: 83.5 fL (ref 80.0–100.0)
Monocytes Absolute: 0.6 10*3/uL (ref 0.1–1.0)
Monocytes Relative: 9 %
Neutro Abs: 3.5 10*3/uL (ref 1.7–7.7)
Neutrophils Relative %: 49 %
Platelets: 321 10*3/uL (ref 150–400)
RBC: 4.85 MIL/uL (ref 3.87–5.11)
RDW: 13.5 % (ref 11.5–15.5)
WBC: 7.1 10*3/uL (ref 4.0–10.5)
nRBC: 0 % (ref 0.0–0.2)

## 2022-12-28 LAB — URINALYSIS, ROUTINE W REFLEX MICROSCOPIC
Bilirubin Urine: NEGATIVE
Glucose, UA: NEGATIVE mg/dL
Hgb urine dipstick: NEGATIVE
Ketones, ur: NEGATIVE mg/dL
Nitrite: NEGATIVE
Protein, ur: NEGATIVE mg/dL
Specific Gravity, Urine: 1.012 (ref 1.005–1.030)
pH: 5 (ref 5.0–8.0)

## 2022-12-28 LAB — PREGNANCY, URINE: Preg Test, Ur: NEGATIVE

## 2022-12-28 LAB — LIPASE, BLOOD: Lipase: 36 U/L (ref 11–51)

## 2022-12-28 LAB — I-STAT BETA HCG BLOOD, ED (MC, WL, AP ONLY): I-stat hCG, quantitative: <5 m[IU]/mL (ref <5)

## 2022-12-28 MED ORDER — HYDROMORPHONE HCL 1 MG/ML IJ SOLN
1.0000 mg | Freq: Once | INTRAMUSCULAR | Status: AC
  Administered 2022-12-28: 1 mg via INTRAVENOUS
  Filled 2022-12-28: qty 1

## 2022-12-28 MED ORDER — IOHEXOL 350 MG/ML SOLN
75.0000 mL | Freq: Once | INTRAVENOUS | Status: AC | PRN
  Administered 2022-12-28: 75 mL via INTRAVENOUS

## 2022-12-28 MED ORDER — OXYCODONE-ACETAMINOPHEN 5-325 MG PO TABS
1.0000 | ORAL_TABLET | Freq: Once | ORAL | Status: AC
  Administered 2022-12-28: 1 via ORAL
  Filled 2022-12-28: qty 1

## 2022-12-28 MED ORDER — OXYCODONE HCL 5 MG PO TABS: 5.0000 mg | ORAL_TABLET | Freq: Three times a day (TID) | ORAL | 0 refills | Status: DC | PRN

## 2022-12-28 MED ORDER — IBUPROFEN 400 MG PO TABS: 600.0000 mg | ORAL_TABLET | Freq: Once | ORAL | Status: DC

## 2022-12-28 MED ORDER — METHYLPREDNISOLONE 4 MG PO TBPK: ORAL_TABLET | ORAL | 0 refills | Status: DC

## 2022-12-28 MED ORDER — CYCLOBENZAPRINE HCL 10 MG PO TABS: 10.0000 mg | ORAL_TABLET | Freq: Two times a day (BID) | ORAL | 0 refills | Status: DC | PRN

## 2022-12-28 MED ORDER — GABAPENTIN 300 MG PO CAPS: 300.0000 mg | ORAL_CAPSULE | Freq: Three times a day (TID) | ORAL | 0 refills | Status: DC | PRN

## 2022-12-28 NOTE — ED Notes (Signed)
Patient provided crutches.

## 2022-12-28 NOTE — ED Notes (Signed)
Pt requested to dress back into clothing post MRI. Per Trifan MD, Ccollar cleared to be removed. Ccollar was removed for dressing. Post dressing, pt requested ccollar be replaced for comfort.
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
## 2022-12-28 NOTE — ED Notes (Signed)
Assisted patient to bathroom. Urine sample specimen collected.

## 2022-12-28 NOTE — ED Notes (Signed)
EMS Ccollar removed and miami j applied, cspine precautions maintained

## 2022-12-28 NOTE — ED Notes (Signed)
Vitals to be updated upon return from the bathroom

## 2022-12-28 NOTE — ED Notes (Signed)
Vitals to be updated upon arrival from MRI

## 2022-12-28 NOTE — ED Notes (Signed)
Patient returned from MRI.

## 2022-12-28 NOTE — ED Notes (Signed)
Patient provided sips of water. Provider at bedside.
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
## 2022-12-28 NOTE — ED Notes (Signed)
Patient transported to MRI 
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
## 2023-01-22 ENCOUNTER — Ambulatory Visit: Payer: Medicaid Other

## 2023-01-22 ENCOUNTER — Other Ambulatory Visit (HOSPITAL_COMMUNITY)
Admission: RE | Admit: 2023-01-22 | Discharge: 2023-01-22 | Disposition: A | Discharge status: Home / Self Care | Payer: Medicaid Other | Source: Ambulatory Visit | Attending: Obstetrics & Gynecology | Admitting: Obstetrics & Gynecology

## 2023-01-22 ENCOUNTER — Ambulatory Visit (INDEPENDENT_AMBULATORY_CARE_PROVIDER_SITE_OTHER): Payer: Medicaid Other | Admitting: General Practice

## 2023-01-22 VITALS — BP 144/108 | HR 84 | Ht 67.0 in | Wt 144.8 lb

## 2023-01-22 DIAGNOSIS — N898 Other specified noninflammatory disorders of vagina: Secondary | ICD-10-CM | POA: Diagnosis not present

## 2023-01-22 LAB — POCT URINALYSIS DIPSTICK
Bilirubin, UA: NEGATIVE
Blood, UA: NEGATIVE
Glucose, UA: NEGATIVE
Ketones, UA: NEGATIVE
Leukocytes, UA: NEGATIVE
Nitrite, UA: NEGATIVE
Protein, UA: NEGATIVE
Spec Grav, UA: 1.015 (ref 1.010–1.025)
Urobilinogen, UA: 1 E.U./dL — AB
pH, UA: 6.5 (ref 5.0–8.0)

## 2023-01-22 NOTE — Progress Notes (Signed)
SUBJECTIVE:  22 y.o. female complains of white vaginal discharge and irritation for 5 day(s). Denies abnormal vaginal bleeding or significant pelvic pain or fever. No UTI symptoms. Denies history of known exposure to STD.  Patient's last menstrual period was 11/30/2022.  OBJECTIVE:  She appears well, afebrile. Urine dipstick: not done.  ASSESSMENT:  Vaginal Discharge  Vaginal Odor   PLAN:  GC, chlamydia, trichomonas, BVAG, CVAG probe sent to lab. Treatment: To be determined once lab results are received ROV prn if symptoms persist or worsen.

## 2023-01-23 ENCOUNTER — Encounter: Payer: Self-pay | Admitting: Obstetrics

## 2023-01-23 LAB — CERVICOVAGINAL ANCILLARY ONLY
Bacterial Vaginitis (gardnerella): NEGATIVE
Candida Glabrata: NEGATIVE
Candida Vaginitis: NEGATIVE
Chlamydia: NEGATIVE
Comment: NEGATIVE
Comment: NEGATIVE
Comment: NEGATIVE
Comment: NEGATIVE
Comment: NEGATIVE
Comment: NORMAL
Neisseria Gonorrhea: NEGATIVE
Trichomonas: NEGATIVE

## 2023-01-24 LAB — URINE CULTURE

## 2023-01-30 ENCOUNTER — Ambulatory Visit: Payer: No Typology Code available for payment source | Admitting: Physical Therapy
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
## 2023-01-31 NOTE — Therapy (Incomplete)
OUTPATIENT PHYSICAL THERAPY THORACOLUMBAR EVALUATION   Patient Name: Andrea Archer MRN: [id number] DOB:06-Nov-2001, 22 y.o., female Today's Date: 01/31/2023  END OF SESSION:   Past Medical History:  Diagnosis Date   Anxiety    Bacterial vaginitis    Depression    Hypertension    with pregnancy   Pregnant    Sickle cell trait ([place])    Sickle cell trait ([place])    Past Surgical History:  Procedure Laterality Date   FINGER SURGERY Left    Pinky Finger   FRACTURE SURGERY  01/09/2019   left pinky finger   Patient Active Problem List   Diagnosis Date Noted   DUB (dysfunctional uterine bleeding) 01/11/2022   Exposure to STD 01/11/2022   History of gestational hypertension 12/21/2019   Sickle cell trait ([place]) 05/04/2019   Change in bowel habits 06/18/2018    PCP: no PCP in chart  REFERRING PROVIDER: Kary Kos, MD  REFERRING DIAG: M54.40 (ICD-10-CM) - Lumbago with sciatica, unspecified side  Rationale for Evaluation and Treatment: Rehabilitation  THERAPY DIAG:  No diagnosis found.  ONSET DATE: ***  SUBJECTIVE:                                                                                                                                                                                           SUBJECTIVE STATEMENT: ***  PERTINENT HISTORY:  ***  PAIN:  Are you having pain: *** Location/description: *** Best-worst over past week: ***  Per eval -  - aggravating factors: *** - Easing factors: ***    PRECAUTIONS: None  WEIGHT BEARING RESTRICTIONS: No  FALLS:  Has patient fallen in last 6 months? {fallsyesno:27318}  LIVING ENVIRONMENT: Lives with: {[facility] lives with:25569::"lives with their family"} Lives in: {Lives in:[address]} Stairs: {opstairs:27293} Has following equipment at home: {Assistive devices:23999}  OCCUPATION: ***  PLOF: {PLOF:24004}  PATIENT GOALS: ***  NEXT MD VISIT: ***  OBJECTIVE:   DIAGNOSTIC FINDINGS:  Per imaging results from  12/28/22: IMPRESSION: 1. Large central disc protrusion at L4-L5 with right-greater-than-left lateral recess narrowing. Correlate for L5 radiculopathy. 2. Otherwise normal MRI of the cervical, thoracic and lumbar spine.  PATIENT SURVEYS:  ODI: ***   SCREENING FOR RED FLAGS: Unremarkable red flag screening, MRI as above  COGNITION: Overall cognitive status: {cognition:24006}     SENSATION: {sensation:27233}  MUSCLE LENGTH: Hamstrings: Right *** deg; Left *** deg Marcello Moores test: Right *** deg; Left *** deg  POSTURE: {posture:25561}  PALPATION: ***  LUMBAR ROM:   AROM eval  Flexion   Extension   Right lateral flexion   Left lateral flexion   Right rotation   Left rotation    (  Blank rows = not tested)  LOWER EXTREMITY ROM:     {AROM/PROM:27142}  Right eval Left eval  Hip flexion    Hip extension    Hip internal rotation    Hip external rotation    (Blank rows = not tested) (Key: WFL = within functional limits not formally assessed, * = concordant pain, s = stiffness/stretching sensation, NT = not tested)  Comments:    LOWER EXTREMITY MMT:    MMT Right eval Left eval  Hip flexion    Hip abduction (modified sitting)    Hip internal rotation    Hip external rotation    Knee flexion    Knee extension     (Blank rows = not tested) (Key: WFL = within functional limits not formally assessed, * = concordant pain, s = stiffness/stretching sensation, NT = not tested)  Comments:    LUMBAR SPECIAL TESTS:  {lumbar special test:25242}  FUNCTIONAL TESTS:  {Functional tests:24029}  GAIT: Distance walked: within clinic Assistive device utilized: {Assistive devices:23999} Level of assistance: {Levels of assistance:24026} Comments: ***  TODAY'S TREATMENT:                                                                                                                              [facility] Adult PT Treatment:                                                DATE:  *** Therapeutic Exercise: *** Manual Therapy: *** Neuromuscular re-ed: *** Therapeutic Activity: *** Modalities: *** Self Care: ***   PATIENT EDUCATION:  Education details: Pt education on PT impairments, prognosis, and POC. Informed consent. Rationale for interventions, safe/appropriate HEP performance Person educated: Patient Education method: Explanation, Demonstration, Tactile cues, Verbal cues, and Handouts Education comprehension: verbalized understanding, returned demonstration, verbal cues required, tactile cues required, and needs further education    HOME EXERCISE PROGRAM: ***  ASSESSMENT:  CLINICAL IMPRESSION: Patient is a 22 y.o. woman who was seen today for physical therapy evaluation and treatment for low back pain.   OBJECTIVE IMPAIRMENTS: {opptimpairments:25111}.   ACTIVITY LIMITATIONS: {activitylimitations:27494}  PARTICIPATION LIMITATIONS: {participationrestrictions:25113}  PERSONAL FACTORS: {Personal factors:25162} are also affecting patient's functional outcome.   REHAB POTENTIAL: {rehabpotential:25112}  CLINICAL DECISION MAKING: {clinical decision making:25114}  EVALUATION COMPLEXITY: {Evaluation complexity:25115}   GOALS: Goals reviewed with patient? {yes/no:20286}  SHORT TERM GOALS: Target date: ***  Pt will demonstrate appropriate understanding and performance of initially prescribed HEP in order to facilitate improved independence with management of symptoms.  Baseline: HEP provided on eval Goal status: INITIAL   2. Pt will score greater than or equal to *** on FOTO in order to demonstrate improved perception of function due to symptoms.  Baseline: ***  Goal status: INITIAL    LONG TERM GOALS: Target date: ***  Pt will score ***  on FOTO in order to demonstrate improved perception of functional status due to symptoms.  Baseline: *** Goal status: INITIAL  2.  Pt will demonstrate *** lumbar AROM in order to demonstrate improved  tolerance to functional movement patterns.   Baseline: *** Goal status: INITIAL  3.  Pt will demonstrate hip MMT of *** in order to demonstrate improved strength for functional movements.  Baseline: *** Goal status: INITIAL  4. Pt will perform 5xSTS in <*** sec in order to demonstrate reduced fall risk and improved functional independence. (MCID of 2.3sec)  Baseline: ***  Goal status: INITIAL   PLAN:  PT FREQUENCY: {rehab frequency:25116}  PT DURATION: {rehab duration:25117}  PLANNED INTERVENTIONS: {rehab planned interventions:25118::"Therapeutic exercises","Therapeutic activity","Neuromuscular re-education","Balance training","Gait training","Patient/Family education","Self Care","Joint mobilization"}.  PLAN FOR NEXT SESSION: Progress ROM/strengthening exercises as able/appropriate, review HEP.    Leeroy Cha PT, DPT 01/31/2023 4:53 PM

## 2023-02-01 ENCOUNTER — Other Ambulatory Visit: Payer: Self-pay

## 2023-02-01 ENCOUNTER — Ambulatory Visit: Payer: Medicaid Other | Admitting: Physical Therapy

## 2023-02-01 ENCOUNTER — Ambulatory Visit: Payer: Medicaid Other | Attending: Physical Therapy | Admitting: Physical Therapy

## 2023-02-01 ENCOUNTER — Encounter: Payer: Self-pay | Admitting: Physical Therapy

## 2023-02-01 DIAGNOSIS — M5442 Lumbago with sciatica, left side: Secondary | ICD-10-CM | POA: Insufficient documentation

## 2023-02-01 DIAGNOSIS — M6281 Muscle weakness (generalized): Secondary | ICD-10-CM

## 2023-02-01 DIAGNOSIS — R2689 Other abnormalities of gait and mobility: Secondary | ICD-10-CM
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
## 2023-02-06 ENCOUNTER — Ambulatory Visit (INDEPENDENT_AMBULATORY_CARE_PROVIDER_SITE_OTHER): Payer: Medicaid Other

## 2023-02-06 ENCOUNTER — Other Ambulatory Visit (HOSPITAL_COMMUNITY)
Admission: RE | Admit: 2023-02-06 | Discharge: 2023-02-06 | Disposition: A | Discharge status: Home / Self Care | Payer: Medicaid Other | Source: Ambulatory Visit | Attending: Obstetrics and Gynecology | Admitting: Obstetrics and Gynecology

## 2023-02-06 DIAGNOSIS — N898 Other specified noninflammatory disorders of vagina: Secondary | ICD-10-CM | POA: Diagnosis present

## 2023-02-06 DIAGNOSIS — R309 Painful micturition, unspecified: Secondary | ICD-10-CM | POA: Diagnosis not present

## 2023-02-06 LAB — POCT URINALYSIS DIPSTICK
Bilirubin, UA: 0.2
Blood, UA: NEGATIVE
Glucose, UA: NEGATIVE
Ketones, UA: NEGATIVE
Leukocytes, UA: NEGATIVE
Nitrite, UA: NEGATIVE
Protein, UA: NEGATIVE
Spec Grav, UA: 1.015 (ref 1.010–1.025)
Urobilinogen, UA: 0.2 E.U./dL
pH, UA: 6.5 (ref 5.0–8.0)

## 2023-02-06 NOTE — Progress Notes (Signed)
..  SUBJECTIVE:  22 y.o. female complains of clear vaginal discharge for 4 day(s). Denies abnormal vaginal bleeding or significant pelvic pain or fever. Denies history of known exposure to STD.  No LMP recorded.  OBJECTIVE:  She appears well, afebrile. Urine dipstick: negative for all components.  ASSESSMENT:  Vaginal Discharge  Discomfort with urination   PLAN:  GC, chlamydia, trichomonas, BVAG, CVAG probe, Urine culture sent to lab. Treatment: To be determined once lab results are received ROV prn if symptoms persist or worsen.

## 2023-02-07 ENCOUNTER — Other Ambulatory Visit: Payer: Self-pay

## 2023-02-07 DIAGNOSIS — B9689 Other specified bacterial agents as the cause of diseases classified elsewhere: Secondary | ICD-10-CM

## 2023-02-07 LAB — CERVICOVAGINAL ANCILLARY ONLY
Bacterial Vaginitis (gardnerella): POSITIVE — AB
Candida Glabrata: NEGATIVE
Candida Vaginitis: NEGATIVE
Chlamydia: NEGATIVE
Comment: NEGATIVE
Comment: NEGATIVE
Comment: NEGATIVE
Comment: NEGATIVE
Comment: NEGATIVE
Comment: NORMAL
Neisseria Gonorrhea: NEGATIVE
Trichomonas: NEGATIVE

## 2023-02-07 MED ORDER — METRONIDAZOLE 500 MG PO TABS: 500.0000 mg | ORAL_TABLET | Freq: Two times a day (BID) | ORAL | 2 refills | Status: DC

## 2023-02-08 LAB — URINE CULTURE
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
## 2023-02-14 ENCOUNTER — Ambulatory Visit: Payer: Medicaid Other | Admitting: Physical Therapy
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
## 2023-02-18 ENCOUNTER — Encounter: Payer: Self-pay | Admitting: Physical Therapy

## 2023-02-18 ENCOUNTER — Ambulatory Visit: Payer: Medicaid Other | Admitting: Physical Therapy

## 2023-02-18 DIAGNOSIS — R2689 Other abnormalities of gait and mobility: Secondary | ICD-10-CM

## 2023-02-18 DIAGNOSIS — M6281 Muscle weakness (generalized): Secondary | ICD-10-CM

## 2023-02-18 DIAGNOSIS — M5442 Lumbago with sciatica, left side: Secondary | ICD-10-CM | POA: Diagnosis not present

## 2023-02-20 ENCOUNTER — Encounter: Payer: Self-pay | Admitting: Physical Therapy

## 2023-02-20 ENCOUNTER — Ambulatory Visit: Payer: Medicaid Other | Admitting: Physical Therapy

## 2023-02-20 DIAGNOSIS — M6281 Muscle weakness (generalized): Secondary | ICD-10-CM

## 2023-02-20 DIAGNOSIS — R2689 Other abnormalities of gait and mobility: Secondary | ICD-10-CM

## 2023-02-20 DIAGNOSIS — M5442 Lumbago with sciatica, left side: Secondary | ICD-10-CM | POA: Diagnosis not present
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
## 2023-02-21 ENCOUNTER — Other Ambulatory Visit: Payer: Self-pay | Admitting: Obstetrics

## 2023-02-21 DIAGNOSIS — N760 Acute vaginitis: Secondary | ICD-10-CM

## 2023-02-21 DIAGNOSIS — B3731 Acute candidiasis of vulva and vagina: Secondary | ICD-10-CM

## 2023-02-21 MED ORDER — FLUCONAZOLE 200 MG PO TABS: 200.0000 mg | ORAL_TABLET | ORAL | 2 refills | Status: DC

## 2023-02-21 MED ORDER — METRONIDAZOLE 0.75 % VA GEL: 1.0000 | Freq: Two times a day (BID) | VAGINAL | 2 refills | Status: DC
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
## 2023-02-25 ENCOUNTER — Ambulatory Visit: Payer: Medicaid Other | Admitting: Physical Therapy

## 2023-02-27 ENCOUNTER — Ambulatory Visit: Payer: Medicaid Other | Admitting: Physical Therapy

## 2023-02-27 ENCOUNTER — Encounter: Payer: Self-pay | Admitting: Physical Therapy

## 2023-02-27 DIAGNOSIS — M5442 Lumbago with sciatica, left side: Secondary | ICD-10-CM | POA: Diagnosis not present

## 2023-02-27 DIAGNOSIS — R2689 Other abnormalities of gait and mobility: Secondary | ICD-10-CM
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
## 2023-02-27 NOTE — Patient Instructions (Signed)

## 2023-03-06 ENCOUNTER — Encounter: Payer: Self-pay | Admitting: Obstetrics

## 2023-03-06 ENCOUNTER — Other Ambulatory Visit (HOSPITAL_COMMUNITY)
Admission: RE | Admit: 2023-03-06 | Discharge: 2023-03-06 | Disposition: A | Discharge status: Home / Self Care | Payer: Medicaid Other | Source: Ambulatory Visit | Attending: Obstetrics and Gynecology | Admitting: Obstetrics and Gynecology

## 2023-03-06 ENCOUNTER — Ambulatory Visit: Payer: Medicaid Other

## 2023-03-06 DIAGNOSIS — N898 Other specified noninflammatory disorders of vagina: Secondary | ICD-10-CM

## 2023-03-06 NOTE — Progress Notes (Signed)
..  SUBJECTIVE:  22 y.o. female complains of white vaginal discharge and irritation for 2 week(s). Denies abnormal vaginal bleeding or significant pelvic pain or fever. No UTI symptoms. Denies history of known exposure to STD.  No LMP recorded.  OBJECTIVE:  She appears well, afebrile. Urine dipstick: not done.  ASSESSMENT:  Vaginal Discharge  Vaginal itching/irritation   PLAN:  GC, chlamydia, trichomonas, BVAG, CVAG probe sent to lab. Treatment: To be determined once lab results are received ROV prn if symptoms persist or worsen.

## 2023-03-07 ENCOUNTER — Other Ambulatory Visit: Payer: Self-pay | Admitting: Obstetrics

## 2023-03-07 DIAGNOSIS — N760 Acute vaginitis: Secondary | ICD-10-CM

## 2023-03-07 LAB — CERVICOVAGINAL ANCILLARY ONLY
Bacterial Vaginitis (gardnerella): POSITIVE — AB
Candida Glabrata: NEGATIVE
Candida Vaginitis: NEGATIVE
Chlamydia: NEGATIVE
Comment: NEGATIVE
Comment: NEGATIVE
Comment: NEGATIVE
Comment: NEGATIVE
Comment: NEGATIVE
Comment: NORMAL
Neisseria Gonorrhea: NEGATIVE
Trichomonas: NEGATIVE

## 2023-03-07 MED ORDER — AZO BORIC ACID 600 MG VA SUPP: 1.0000 | Freq: Every day | VAGINAL | 0 refills | Status: DC

## 2023-03-08 ENCOUNTER — Other Ambulatory Visit: Payer: Self-pay | Admitting: Obstetrics

## 2023-03-09 ENCOUNTER — Other Ambulatory Visit: Payer: Self-pay | Admitting: Obstetrics

## 2023-03-12 ENCOUNTER — Ambulatory Visit: Payer: Medicaid Other | Admitting: Physical Therapy

## 2023-03-14 ENCOUNTER — Other Ambulatory Visit (HOSPITAL_COMMUNITY)
Admission: RE | Admit: 2023-03-14 | Discharge: 2023-03-14 | Disposition: A | Discharge status: Home / Self Care | Payer: Medicaid Other | Source: Ambulatory Visit | Attending: Obstetrics | Admitting: Obstetrics

## 2023-03-14 ENCOUNTER — Encounter: Payer: Self-pay | Admitting: Obstetrics

## 2023-03-14 ENCOUNTER — Ambulatory Visit (INDEPENDENT_AMBULATORY_CARE_PROVIDER_SITE_OTHER): Payer: Medicaid Other | Admitting: Obstetrics

## 2023-03-14 VITALS — BP 133/87 | HR 87 | Ht 67.0 in | Wt 143.6 lb

## 2023-03-14 DIAGNOSIS — Z113 Encounter for screening for infections with a predominantly sexual mode of transmission: Secondary | ICD-10-CM

## 2023-03-14 DIAGNOSIS — N898 Other specified noninflammatory disorders of vagina: Secondary | ICD-10-CM

## 2023-03-14 DIAGNOSIS — B3731 Acute candidiasis of vulva and vagina: Secondary | ICD-10-CM

## 2023-03-14 DIAGNOSIS — N764 Abscess of vulva: Secondary | ICD-10-CM

## 2023-03-14 DIAGNOSIS — Z3202 Encounter for pregnancy test, result negative: Secondary | ICD-10-CM | POA: Diagnosis not present

## 2023-03-14 DIAGNOSIS — B9689 Other specified bacterial agents as the cause of diseases classified elsewhere: Secondary | ICD-10-CM

## 2023-03-14 DIAGNOSIS — N760 Acute vaginitis: Secondary | ICD-10-CM | POA: Diagnosis not present

## 2023-03-14 LAB — POCT URINE PREGNANCY: Preg Test, Ur: NEGATIVE

## 2023-03-14 MED ORDER — FLUCONAZOLE 150 MG PO TABS: 150.0000 mg | ORAL_TABLET | Freq: Once | ORAL | 0 refills | Status: AC

## 2023-03-14 MED ORDER — CLINDAMYCIN HCL 300 MG PO CAPS: 300.0000 mg | ORAL_CAPSULE | Freq: Three times a day (TID) | ORAL | 0 refills | Status: DC

## 2023-03-14 NOTE — Progress Notes (Signed)
Patient ID: [name], female   DOB: 2001/07/05, 22 y.o.   MRN: [id number]  HPI Andrea Archer is a 22 y.o. female.  Complains of sore in vulva area  and vaginal discharge.  Has a new sexual partner. HPI  Past Medical History:  Diagnosis Date   Anxiety    Bacterial vaginitis    Depression    Hypertension    with pregnancy   Pregnant    Sickle cell trait (HCC)    Sickle cell trait ([place])     Past Surgical History:  Procedure Laterality Date   FINGER SURGERY Left    Pinky Finger   FRACTURE SURGERY  01/09/2019   left pinky finger    Family History  Problem Relation Age of Onset   Hypertension Mother    Sickle cell anemia Father    Diabetes Maternal Grandmother    Hypertension Maternal Grandmother    Heart disease Maternal Grandmother    Fibromyalgia Maternal Grandmother    Hypertension Maternal Grandfather    Pancreatic cancer Maternal Grandfather    Hypertension Paternal Grandmother    Hypertension Paternal Grandfather    Colon cancer Neg Hx    Stomach cancer Neg Hx    Esophageal cancer Neg Hx    Rectal cancer Neg Hx     Social History Social History   Tobacco Use   Smoking status: Former    Types: Cigars    Passive exposure: Past   Smokeless tobacco: Never  Vaping Use   Vaping Use: Never used  Substance Use Topics   Alcohol use: Not Currently   Drug use: No    Allergies  Allergen Reactions   Other     Henna - rash   Mushroom Extract Complex Rash   Pineapple Rash    Current Outpatient Medications  Medication Sig Dispense Refill   Boric Acid Vaginal (AZO BORIC ACID) 600 MG SUPP Place 1 suppository vaginally at bedtime. 14 suppository 0   clindamycin (CLEOCIN) 300 MG capsule Take 1 capsule (300 mg total) by mouth 3 (three) times daily. 21 capsule 0   fluconazole (DIFLUCAN) 150 MG tablet Take 1 tablet (150 mg total) by mouth once for 1 dose. 1 tablet 0   cyclobenzaprine (FLEXERIL) 10 MG tablet Take 1 tablet (10 mg total) by mouth 2 (two) times daily as  needed for up to 15 doses for muscle spasms. (Patient not taking: Reported on 01/22/2023) 15 tablet 0   fluconazole (DIFLUCAN) 200 MG tablet Take 1 tablet (200 mg total) by mouth every 3 (three) days. (Patient not taking: Reported on 03/14/2023) 3 tablet 2   gabapentin (NEURONTIN) 300 MG capsule Take 1 capsule (300 mg total) by mouth 3 (three) times daily as needed. (Patient not taking: Reported on 01/22/2023) 30 capsule 0   methylPREDNISolone (MEDROL DOSEPAK) 4 MG TBPK tablet Use as directed (Patient not taking: Reported on 03/14/2023) 21 tablet 0   oxyCODONE (ROXICODONE) 5 MG immediate release tablet Take 1 tablet (5 mg total) by mouth every 8 (eight) hours as needed for up to 5 doses for severe pain. (Patient not taking: Reported on 03/14/2023) 5 tablet 0   predniSONE (DELTASONE) 20 MG tablet Take 2 tablets (40 mg total) by mouth daily. (Patient not taking: Reported on 03/14/2023) 14 tablet 0   triamcinolone cream (KENALOG) 0.1 % Apply 1 Application topically 2 (two) times daily. (Patient not taking: Reported on 03/14/2023) 15 g 0   No current facility-administered medications for this visit.    Review of Systems  Review of Systems Constitutional: negative for fatigue and weight loss Respiratory: negative for cough and wheezing Cardiovascular: negative for chest pain, fatigue and palpitations Gastrointestinal: negative for abdominal pain and change in bowel habits Genitourinary:positive for vulva sore and vaginal discharge Integument/breast: negative for nipple discharge Musculoskeletal:negative for myalgias Neurological: negative for gait problems and tremors Behavioral/Psych: negative for abusive relationship, depression Endocrine: negative for temperature intolerance      Blood pressure 133/87, pulse 87, height '5\' 7"'$  (1.702 m), weight 143 lb 9.6 oz (65.1 kg).  Physical Exam Physical Exam General:   Alert and no distress  Skin:   no rash or abnormalities  Lungs:   clear to auscultation  bilaterally  Heart:   regular rate and rhythm, S1, S2 normal, no murmur, click, rub or gallop  Breasts:   Not examined  Abdomen:  normal findings: no organomegaly, soft, non-tender and no hernia  Pelvis:  External left vulva lesion Urinary system: urethral meatus normal and bladder without fullness, nontender Vaginal: normal without tenderness, induration or masses Cervix: normal appearance Adnexa: normal bimanual exam Uterus: anteverted and non-tender, normal size    I have spent a total of 20 minutes of face-to-face time, excluding clinical staff time, reviewing notes and preparing to see patient, ordering tests and/or medications, and counseling the patient.   Data Reviewed Wet Prep  Assessment     1. Vulvar abscess Rx: - clindamycin (CLEOCIN) 300 MG capsule; Take 1 capsule (300 mg total) by mouth 3 (three) times daily.  Dispense: 21 capsule; Refill: 0 - Herpes simplex virus culture  2. Vaginal discharge Rx: - Cervicovaginal ancillary only( [place])  3. Screen for STD (sexually transmitted disease) Rx: - HIV antibody (with reflex) - RPR - Hepatitis C Antibody - Hepatitis B Surface AntiGEN  4. BV (bacterial vaginosis) - [facility] Rx  5. Candida vaginitis Rx: - fluconazole (DIFLUCAN) 150 MG tablet; Take 1 tablet (150 mg total) by mouth once for 1 dose.  Dispense: 1 tablet; Refill: 0  6. Pregnancy examination or test, negative result Rx: - POCT urine pregnancy:  NEGATIVE    Plan    Orders Placed This Encounter  Procedures   HIV antibody (with reflex)   RPR   Hepatitis C Antibody   Hepatitis B Surface AntiGEN   POCT urine pregnancy   Meds ordered this encounter  Medications   clindamycin (CLEOCIN) 300 MG capsule    Sig: Take 1 capsule (300 mg total) by mouth 3 (three) times daily.    Dispense:  21 capsule    Refill:  0   fluconazole (DIFLUCAN) 150 MG tablet    Sig: Take 1 tablet (150 mg total) by mouth once for 1 dose.    Dispense:  1 tablet     Refill:  0     Shelly Bombard, MD 03/14/2023 10:59 AM

## 2023-03-14 NOTE — Progress Notes (Signed)
Pt c/o bartholin cyst, swollen glands in the nack and groin and "cut in the groin". Pt also reports recurring BV. Pt reports watery, clouding discharge and vaginal itching for 3 days. Pt has new partner and requesting STD testing and UPT.

## 2023-03-15 LAB — CERVICOVAGINAL ANCILLARY ONLY
Bacterial Vaginitis (gardnerella): NEGATIVE
Candida Glabrata: NEGATIVE
Candida Vaginitis: NEGATIVE
Chlamydia: NEGATIVE
Comment: NEGATIVE
Comment: NEGATIVE
Comment: NEGATIVE
Comment: NEGATIVE
Comment: NEGATIVE
Comment: NORMAL
Neisseria Gonorrhea: NEGATIVE
Trichomonas: NEGATIVE

## 2023-03-15 LAB — HEPATITIS B SURFACE ANTIGEN: Hepatitis B Surface Ag: NEGATIVE

## 2023-03-15 LAB — RPR: RPR Ser Ql: NONREACTIVE

## 2023-03-15 LAB — HEPATITIS C ANTIBODY: Hep C Virus Ab: NONREACTIVE

## 2023-03-15 LAB — HIV ANTIBODY (ROUTINE TESTING W REFLEX): HIV Screen 4th Generation wRfx: NONREACTIVE

## 2023-03-17 LAB — HERPES SIMPLEX VIRUS CULTURE

## 2023-03-18 ENCOUNTER — Other Ambulatory Visit: Payer: Self-pay | Admitting: Obstetrics

## 2023-03-18 ENCOUNTER — Encounter: Payer: Self-pay | Admitting: Obstetrics

## 2023-03-18 ENCOUNTER — Other Ambulatory Visit: Payer: Self-pay | Admitting: Emergency Medicine

## 2023-03-18 DIAGNOSIS — B009 Herpesviral infection, unspecified: Secondary | ICD-10-CM

## 2023-03-18 MED ORDER — VALACYCLOVIR HCL 1 G PO TABS: 1000.0000 mg | ORAL_TABLET | Freq: Two times a day (BID) | ORAL | 11 refills | Status: DC
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
## 2023-03-27 ENCOUNTER — Ambulatory Visit: Payer: Medicaid Other | Attending: Physical Therapy | Admitting: Physical Therapy

## 2023-03-27 ENCOUNTER — Encounter: Payer: Self-pay | Admitting: Physical Therapy

## 2023-03-27 DIAGNOSIS — M6281 Muscle weakness (generalized): Secondary | ICD-10-CM | POA: Insufficient documentation

## 2023-03-27 DIAGNOSIS — M5442 Lumbago with sciatica, left side: Secondary | ICD-10-CM | POA: Diagnosis present

## 2023-03-27 DIAGNOSIS — R2689 Other abnormalities of gait and mobility: Secondary | ICD-10-CM | POA: Insufficient documentation

## 2023-04-01 ENCOUNTER — Encounter: Payer: Self-pay | Admitting: Obstetrics

## 2023-04-01 ENCOUNTER — Ambulatory Visit (INDEPENDENT_AMBULATORY_CARE_PROVIDER_SITE_OTHER): Payer: Medicaid Other | Admitting: Obstetrics

## 2023-04-01 VITALS — BP 118/80 | HR 69 | Ht 67.0 in | Wt 148.0 lb

## 2023-04-01 DIAGNOSIS — N764 Abscess of vulva: Secondary | ICD-10-CM

## 2023-04-01 DIAGNOSIS — B009 Herpesviral infection, unspecified: Secondary | ICD-10-CM

## 2023-04-01 MED ORDER — CLINDAMYCIN HCL 300 MG PO CAPS: 300.0000 mg | ORAL_CAPSULE | Freq: Three times a day (TID) | ORAL | 0 refills | Status: DC

## 2023-04-01 NOTE — Progress Notes (Signed)
Patient ID: Andrea Archer, female   DOB: May 14, 2001, 22 y.o.   MRN: [id number]  No chief complaint on file.   HPI Andrea Archer is a 22 y.o. female.  Presents for follow up after HSV-1 primary herpes infection of vulva.  The vulva abscess has healed.  She still has mild groin lymph node pain and mild pelvic pain.  Denies fever/chills or dysuria. HPI  Past Medical History:  Diagnosis Date   Anxiety    Bacterial vaginitis    Depression    Hypertension    with pregnancy   Pregnant    Sickle cell trait (HCC)    Sickle cell trait ([place])     Past Surgical History:  Procedure Laterality Date   FINGER SURGERY Left    Pinky Finger   FRACTURE SURGERY  01/09/2019   left pinky finger    Family History  Problem Relation Age of Onset   Hypertension Mother    Sickle cell anemia Father    Diabetes Maternal Grandmother    Hypertension Maternal Grandmother    Heart disease Maternal Grandmother    Fibromyalgia Maternal Grandmother    Hypertension Maternal Grandfather    Pancreatic cancer Maternal Grandfather    Hypertension Paternal Grandmother    Hypertension Paternal Grandfather    Colon cancer Neg Hx    Stomach cancer Neg Hx    Esophageal cancer Neg Hx    Rectal cancer Neg Hx     Social History Social History   Tobacco Use   Smoking status: Former    Types: Cigars    Passive exposure: Past   Smokeless tobacco: Never  Vaping Use   Vaping Use: Never used  Substance Use Topics   Alcohol use: Not Currently   Drug use: No    Allergies  Allergen Reactions   Other     Henna - rash   Mushroom Extract Complex Rash   Pineapple Rash    Current Outpatient Medications  Medication Sig Dispense Refill   Boric Acid Vaginal (AZO BORIC ACID) 600 MG SUPP Place 1 suppository vaginally at bedtime. 14 suppository 0   clindamycin (CLEOCIN) 300 MG capsule Take 1 capsule (300 mg total) by mouth 3 (three) times daily. 21 capsule 0   cyclobenzaprine (FLEXERIL) 10 MG tablet Take 1 tablet (10  mg total) by mouth 2 (two) times daily as needed for up to 15 doses for muscle spasms. (Patient not taking: Reported on 01/22/2023) 15 tablet 0   fluconazole (DIFLUCAN) 200 MG tablet Take 1 tablet (200 mg total) by mouth every 3 (three) days. (Patient not taking: Reported on 03/14/2023) 3 tablet 2   gabapentin (NEURONTIN) 300 MG capsule Take 1 capsule (300 mg total) by mouth 3 (three) times daily as needed. (Patient not taking: Reported on 01/22/2023) 30 capsule 0   methylPREDNISolone (MEDROL DOSEPAK) 4 MG TBPK tablet Use as directed (Patient not taking: Reported on 03/14/2023) 21 tablet 0   oxyCODONE (ROXICODONE) 5 MG immediate release tablet Take 1 tablet (5 mg total) by mouth every 8 (eight) hours as needed for up to 5 doses for severe pain. (Patient not taking: Reported on 03/14/2023) 5 tablet 0   predniSONE (DELTASONE) 20 MG tablet Take 2 tablets (40 mg total) by mouth daily. (Patient not taking: Reported on 03/14/2023) 14 tablet 0   triamcinolone cream (KENALOG) 0.1 % Apply 1 Application topically 2 (two) times daily. (Patient not taking: Reported on 03/14/2023) 15 g 0   valACYclovir (VALTREX) 1000 MG tablet Take 1 tablet (1,000  mg total) by mouth 2 (two) times daily. Take 1 tablet twice a day for 10 days. 20 tablet 11   No current facility-administered medications for this visit.    Review of Systems Review of Systems Constitutional: negative for fatigue and weight loss Respiratory: negative for cough and wheezing Cardiovascular: negative for chest pain, fatigue and palpitations Gastrointestinal: negative for abdominal pain and change in bowel habits Genitourinary:positive for vaginal discharge with odor Integument/breast: negative for nipple discharge Musculoskeletal:negative for myalgias Neurological: negative for gait problems and tremors Behavioral/Psych: negative for abusive relationship, depression Endocrine: negative for temperature intolerance      There were no vitals taken for this  visit.  Physical Exam Physical Exam General:   Alert and no distress  Skin:   no rash or abnormalities  Lungs:   clear to auscultation bilaterally  Heart:   regular rate and rhythm, S1, S2 normal, no murmur, click, rub or gallop  Breasts:   Not examined  Abdomen:  normal findings: no organomegaly, soft, non-tender and no hernia  Pelvis:  External genitalia: normal general appearance Urinary system: urethral meatus normal and bladder without fullness, nontender Vaginal: normal without tenderness, induration or masses Cervix: normal appearance Adnexa: normal bimanual exam Uterus: anteverted and non-tender, normal size    I have spent a total of 20 minutes of face-to-face time, excluding clinical staff time, reviewing notes and preparing to see patient, ordering tests and/or medications, and counseling the patient.   Data Reviewed Labs Wet Prep and Cultures  Assessment     1. Vulvar abscess Rx: - clindamycin (CLEOCIN) 300 MG capsule; Take 1 capsule (300 mg total) by mouth 3 (three) times daily.  Dispense: 21 capsule; Refill: 0  2. Herpes simplex type 1 infection, resolved - completed 10 day course of Valtrex     Plan Follow up in 6 weeks     Shelly Bombard, MD 04/01/2023 9:39 AM

## 2023-04-01 NOTE — Progress Notes (Signed)
22 y.o GYN presents for vaginal odor, itching, discharge, abdominal pain 4-8/10 x 1 week.  Pt got tested 03/14/23 results were Negative.  Last PAP 11/09/2022

## 2023-04-02 ENCOUNTER — Ambulatory Visit: Payer: Medicaid Other | Admitting: Physical Therapy

## 2023-04-04 ENCOUNTER — Ambulatory Visit: Payer: Medicaid Other | Attending: Physical Therapy | Admitting: Physical Therapy

## 2023-04-09 ENCOUNTER — Encounter: Payer: Self-pay | Admitting: Obstetrics

## 2023-04-09 ENCOUNTER — Ambulatory Visit: Payer: Medicaid Other | Admitting: Obstetrics

## 2023-04-15 ENCOUNTER — Inpatient Hospital Stay (HOSPITAL_COMMUNITY)
Admission: AD | Admit: 2023-04-15 | Discharge: 2023-04-15 | Disposition: A | Discharge status: Home / Self Care | Payer: Medicaid Other | Attending: Obstetrics and Gynecology | Admitting: Obstetrics and Gynecology

## 2023-04-15 ENCOUNTER — Encounter (HOSPITAL_COMMUNITY): Payer: Self-pay | Admitting: Obstetrics and Gynecology

## 2023-04-15 ENCOUNTER — Telehealth: Payer: Self-pay | Admitting: Emergency Medicine

## 2023-04-15 ENCOUNTER — Inpatient Hospital Stay (HOSPITAL_COMMUNITY): Payer: Medicaid Other

## 2023-04-15 ENCOUNTER — Ambulatory Visit: Payer: Medicaid Other | Admitting: Physical Therapy

## 2023-04-15 DIAGNOSIS — N8312 Corpus luteum cyst of left ovary: Secondary | ICD-10-CM | POA: Insufficient documentation

## 2023-04-15 DIAGNOSIS — O26891 Other specified pregnancy related conditions, first trimester: Secondary | ICD-10-CM | POA: Insufficient documentation

## 2023-04-15 DIAGNOSIS — R1032 Left lower quadrant pain: Secondary | ICD-10-CM

## 2023-04-15 DIAGNOSIS — Z3A01 Less than 8 weeks gestation of pregnancy: Secondary | ICD-10-CM | POA: Diagnosis not present

## 2023-04-15 DIAGNOSIS — N831 Corpus luteum cyst of ovary, unspecified side: Secondary | ICD-10-CM

## 2023-04-15 DIAGNOSIS — O3481 Maternal care for other abnormalities of pelvic organs, first trimester: Secondary | ICD-10-CM | POA: Insufficient documentation

## 2023-04-15 HISTORY — DX: Urinary tract infection, site not specified: N39.0

## 2023-04-15 HISTORY — DX: Anemia, unspecified: D64.9

## 2023-04-15 LAB — COMPREHENSIVE METABOLIC PANEL
ALT: 14 U/L (ref 0–44)
AST: 17 U/L (ref 15–41)
Albumin: 3.8 g/dL (ref 3.5–5.0)
Alkaline Phosphatase: 52 U/L (ref 38–126)
Anion gap: 6 (ref 5–15)
BUN: 14 mg/dL (ref 6–20)
CO2: 24 mmol/L (ref 22–32)
Calcium: 8.9 mg/dL (ref 8.9–10.3)
Chloride: 104 mmol/L (ref 98–111)
Creatinine, Ser: 1.09 mg/dL — ABNORMAL HIGH (ref 0.44–1.00)
GFR, Estimated: >60 mL/min (ref >60)
Glucose, Bld: 75 mg/dL (ref 70–99)
Potassium: 3.9 mmol/L (ref 3.5–5.1)
Sodium: 134 mmol/L — ABNORMAL LOW (ref 135–145)
Total Bilirubin: 0.7 mg/dL (ref 0.3–1.2)
Total Protein: 6.9 g/dL (ref 6.5–8.1)

## 2023-04-15 LAB — HCG, QUANTITATIVE, PREGNANCY: hCG, Beta Chain, Quant, S: 1886 m[IU]/mL — ABNORMAL HIGH (ref <5)

## 2023-04-15 LAB — POCT PREGNANCY, URINE: Preg Test, Ur: POSITIVE — AB

## 2023-04-15 LAB — URINALYSIS, ROUTINE W REFLEX MICROSCOPIC
Bacteria, UA: NONE SEEN
Bilirubin Urine: NEGATIVE
Glucose, UA: NEGATIVE mg/dL
Hgb urine dipstick: NEGATIVE
Ketones, ur: NEGATIVE mg/dL
Nitrite: NEGATIVE
Protein, ur: NEGATIVE mg/dL
Specific Gravity, Urine: 1.012 (ref 1.005–1.030)
pH: 5 (ref 5.0–8.0)

## 2023-04-15 LAB — HIV ANTIBODY (ROUTINE TESTING W REFLEX): HIV Screen 4th Generation wRfx: NONREACTIVE

## 2023-04-15 LAB — CBC
HCT: 37.3 % (ref 36.0–46.0)
Hemoglobin: 13.3 g/dL (ref 12.0–15.0)
MCH: 28.9 pg (ref 26.0–34.0)
MCHC: 35.7 g/dL (ref 30.0–36.0)
MCV: 81.1 fL (ref 80.0–100.0)
Platelets: 311 10*3/uL (ref 150–400)
RBC: 4.6 MIL/uL (ref 3.87–5.11)
RDW: 14.1 % (ref 11.5–15.5)
WBC: 9.9 10*3/uL (ref 4.0–10.5)
nRBC: 0 % (ref 0.0–0.2)

## 2023-04-15 LAB — WET PREP, GENITAL
Clue Cells Wet Prep HPF POC: NONE SEEN
Sperm: NONE SEEN
Trich, Wet Prep: NONE SEEN
WBC, Wet Prep HPF POC: >=10 — AB (ref <10)
Yeast Wet Prep HPF POC: NONE SEEN

## 2023-04-15 NOTE — MAU Note (Signed)
Andrea Archer is a 22 y.o. at Unknown here in MAU reporting: +HPT yesterday.  Last night started experiencing severe pain in LLQ.  Still hurting,. "Hurts so bad".  Started bleeding last night, pink- today, is more like a period, was real heavy this morning, has not had any since 1400. Soaked 3  or 4 pads this morning.  LMP: 3/15 Onset of complaint: last night Pain score: 8 Vitals:   04/15/23 1722  BP: 124/72  Pulse: 76  Resp: 20  Temp: 98.4 F (36.9 C)  SpO2: 100%      Lab orders placed from triage:  UPT/ua

## 2023-04-15 NOTE — Telephone Encounter (Signed)
Incoming call from pt reporting + home pregnancy test yesterday. LMP 03/15/23 Reports painful abdominal cramping and bleeding today. Recommended to present to MAU for evaluation. Pt verbalizes understanding.

## 2023-04-15 NOTE — MAU Provider Note (Signed)
History     CSN: [id number]  Arrival date and time: 04/15/23 1645   Event Date/Time   First Provider Initiated Contact with Patient 04/15/23 1924      Chief Complaint  Patient presents with   Abdominal Pain   Vaginal Bleeding   Possible Pregnancy   Andrea Archer , a  22 y.o. G3P1011 at [redacted]w[redacted]d presents to MAU with complaints of lower left quadrant abdominal pain and vaginal spotting that started this morning. Patient reports a intermittent "squeezing sensation" to her LLQ. She denies worsening or alleviating symptoms. She currently rates pain a 8/10 but denies attempting to relieve symptoms. She also reports vaginal spotting that started this morning. She reports that bleeding became heavier and more bright red this morning around 11am but states that bleeding stopped around 2. She reports changing her pad 3-4 times today but denies saturating it or passing clots.  She denies vaginal bleeding or spotting since arrival to MAU. She denies abnormal vaginal discharge, or urinary symptoms.          OB History     Gravida  3   Para  1   Term  1   Preterm  0   AB  1   Living  1      SAB  1   IAB  0   Ectopic  0   Multiple  0   Live Births  1           Past Medical History:  Diagnosis Date   Anemia    Anxiety    Bacterial vaginitis    Depression    Hypertension    with pregnancy   Pregnant    Sickle cell trait    UTI (urinary tract infection)     Past Surgical History:  Procedure Laterality Date   FINGER SURGERY Left    Pinky Finger   FRACTURE SURGERY  01/09/2019   left pinky finger    Family History  Problem Relation Age of Onset   Cancer Mother        cervical ca 2023   Hypertension Mother    Hypertension Father    Heart disease Father    Sickle cell anemia Father    Sickle cell trait Father        has sickle cell   Diabetes Maternal Grandmother    Hypertension Maternal Grandmother    Heart disease Maternal Grandmother    Fibromyalgia  Maternal Grandmother    Hypertension Maternal Grandfather    Pancreatic cancer Maternal Grandfather    Hypertension Paternal Grandmother    Hypertension Paternal Grandfather    Colon cancer Neg Hx    Stomach cancer Neg Hx    Esophageal cancer Neg Hx    Rectal cancer Neg Hx     Social History   Tobacco Use   Smoking status: Former    Types: Cigars    Passive exposure: Past   Smokeless tobacco: Never   Tobacco comments:    Quit 2022  Vaping Use   Vaping Use: Never used  Substance Use Topics   Alcohol use: Not Currently    Comment: occ   Drug use: No    Allergies:  Allergies  Allergen Reactions   Other     Henna - rash   Pineapple Rash    No medications prior to admission.    Review of Systems  Constitutional:  Negative for chills, fatigue and fever.  Eyes:  Negative for pain and visual disturbance.  Respiratory:  Negative for apnea, shortness of breath and wheezing.   Cardiovascular:  Negative for chest pain and palpitations.  Gastrointestinal:  Positive for abdominal pain. Negative for constipation, diarrhea, nausea and vomiting.  Genitourinary:  Positive for pelvic pain and vaginal bleeding. Negative for difficulty urinating, dysuria, vaginal discharge and vaginal pain.  Musculoskeletal:  Negative for back pain.  Neurological:  Negative for seizures, weakness and headaches.  Psychiatric/Behavioral:  Negative for suicidal ideas.    Physical Exam   Blood pressure 124/72, pulse 76, temperature 98.4 F (36.9 C), temperature source Oral, resp. rate 20, height  (1.702 m), weight 66.6 kg, last menstrual period 03/15/2023, SpO2 100 %.  Physical Exam Vitals and nursing note reviewed.  Constitutional:      General: She is not in acute distress.    Appearance: Normal appearance.  HENT:     Head: Normocephalic.  Pulmonary:     Effort: Pulmonary effort is normal.  Musculoskeletal:     Cervical back: Normal range of motion.  Skin:    General: Skin is warm and  dry.  Neurological:     Mental Status: She is alert and oriented to person, place, and time.  Psychiatric:        Mood and Affect: Mood normal.     MAU Course  Procedures Orders Placed This Encounter  Procedures   Wet prep, genital   US OB LESS THAN 14 WEEKS WITH OB TRANSVAGINAL   Urinalysis, Routine w reflex microscopic -Urine, Clean Catch   CBC   Comprehensive metabolic panel   hCG, quantitative, pregnancy   HIV Antibody (routine testing w rflx)   Pregnancy, urine POC   Discharge patient   Results for orders placed or performed during the hospital encounter of 04/15/23 (from the past 24 hour(s))  Urinalysis, Routine w reflex microscopic -Urine, Clean Catch     Status: Abnormal   Collection Time: 04/15/23  5:26 PM  Result Value Ref Range   Color, Urine YELLOW YELLOW   APPearance HAZY (A) CLEAR   Specific Gravity, Urine 1.012 1.005 - 1.030   pH 5.0 5.0 - 8.0   Glucose, UA NEGATIVE NEGATIVE mg/dL   Hgb urine dipstick NEGATIVE NEGATIVE   Bilirubin Urine NEGATIVE NEGATIVE   Ketones, ur NEGATIVE NEGATIVE mg/dL   Protein, ur NEGATIVE NEGATIVE mg/dL   Nitrite NEGATIVE NEGATIVE   Leukocytes,Ua SMALL (A) NEGATIVE   RBC / HPF 0-5 0 - 5 RBC/hpf   WBC, UA 0-5 0 - 5 WBC/hpf   Bacteria, UA NONE SEEN NONE SEEN   Squamous Epithelial / HPF 11-20 0 - 5 /HPF  Pregnancy, urine POC     Status: Abnormal   Collection Time: 04/15/23  5:46 PM  Result Value Ref Range   Preg Test, Ur POSITIVE (A) NEGATIVE  CBC     Status: None   Collection Time: 04/15/23  5:48 PM  Result Value Ref Range   WBC 9.9 4.0 - 10.5 K/uL   RBC 4.60 3.87 - 5.11 MIL/uL   Hemoglobin 13.3 12.0 - 15.0 g/dL   HCT 16.1 09.6 - 04.5 %   MCV 81.1 80.0 - 100.0 fL   MCH 28.9 26.0 - 34.0 pg   MCHC 35.7 30.0 - 36.0 g/dL   RDW 40.9 81.1 - 91.4 %   Platelets 311 150 - 400 K/uL   nRBC 0.0 0.0 - 0.2 %  Comprehensive metabolic panel     Status: Abnormal   Collection Time: 04/15/23  5:48 PM  Result Value Ref  Range   Sodium 134  (L) 135 - 145 mmol/L   Potassium 3.9 3.5 - 5.1 mmol/L   Chloride 104 98 - 111 mmol/L   CO2 24 22 - 32 mmol/L   Glucose, Bld 75 70 - 99 mg/dL   BUN 14 6 - 20 mg/dL   Creatinine, Ser 4.49 (H) 0.44 - 1.00 mg/dL   Calcium 8.9 8.9 - 75.3 mg/dL   Total Protein 6.9 6.5 - 8.1 g/dL   Albumin 3.8 3.5 - 5.0 g/dL   AST 17 15 - 41 U/L   ALT 14 0 - 44 U/L   Alkaline Phosphatase 52 38 - 126 U/L   Total Bilirubin 0.7 0.3 - 1.2 mg/dL   GFR, Estimated >00 >51 mL/min   Anion gap 6 5 - 15  Wet prep, genital     Status: Abnormal   Collection Time: 04/15/23  6:56 PM  Result Value Ref Range   Yeast Wet Prep HPF POC NONE SEEN NONE SEEN   Trich, Wet Prep NONE SEEN NONE SEEN   Clue Cells Wet Prep HPF POC NONE SEEN NONE SEEN   WBC, Wet Prep HPF POC >=10 (A) <10   Sperm NONE SEEN   US OB LESS THAN 14 WEEKS WITH OB TRANSVAGINAL  Result Date: 04/15/2023 CLINICAL DATA:  Left lower quadrant pain, vaginal bleeding EXAM: OBSTETRIC <14 WK [place] AND TRANSVAGINAL OB US TECHNIQUE: Both transabdominal and transvaginal ultrasound examinations were performed for complete evaluation of the gestation as well as the maternal uterus, adnexal regions, and pelvic cul-de-sac. Transvaginal technique was performed to assess early pregnancy. COMPARISON:  None Available. FINDINGS: Intrauterine gestational sac: Single Yolk sac:  Visualized. Embryo:  Not Visualized. Cardiac Activity: Not Visualized. Heart Rate:   bpm MSD: 3.6 mm   5 w   1 d CRL:    mm    w    d                  [place] EDC: Subchorionic hemorrhage:  None visualized. Maternal uterus/adnexae: No adnexal mass or free fluid. IMPRESSION: Early intrauterine gestational sac, 5 weeks 1 day by mean sac diameter. Yolk sac is visualized but no fetal pole currently. This could be followed with repeat ultrasound in 14 days to ensure expected progression. No acute maternal findings. Electronically Signed   By: Charlett Nose M.D.   On: 04/15/2023 18:06     MDM - Wet prep normal.  - UA hazy with  small leuks, low suspicion for UTI - Creatinine mildly elevated.  - CBC Normal. Patient hemodynamically stable.  - [place] results revealed a single IUP measuring about [redacted]w[redacted]d. Imaging also revealed a corpus luteal structure.  - Plan for discharge   Assessment and Plan   1. Abdominal cramping in left lower quadrant   2. Ruptured corpus luteum   3. [redacted] weeks gestation of pregnancy    - Reviewed that pain likely caused by a corpus luteal cyst. Reviewed expectation and OTC options for pain management.  - Worsening signs and return precautions reviewed.  - Recommended to seek prenatal care at the office pf her choice.  - Patient discharged home in stable condition and may return to MAU as needed.   Claudette Head, MSN CNM  04/15/2023, 7:53 PM

## 2023-04-16 LAB — GC/CHLAMYDIA PROBE AMP (~~LOC~~) NOT AT ARMC
Chlamydia: NEGATIVE
Comment: NEGATIVE
Comment: NORMAL
Neisseria Gonorrhea: NEGATIVE

## 2023-04-22 ENCOUNTER — Other Ambulatory Visit: Payer: Self-pay

## 2023-04-22 ENCOUNTER — Ambulatory Visit: Payer: Medicaid Other | Admitting: Physical Therapy

## 2023-04-22 DIAGNOSIS — O3680X0 Pregnancy with inconclusive fetal viability, not applicable or unspecified: Secondary | ICD-10-CM

## 2023-04-23 ENCOUNTER — Encounter: Payer: Self-pay | Admitting: Obstetrics

## 2023-04-29 ENCOUNTER — Ambulatory Visit: Payer: Medicaid Other | Admitting: Physical Therapy

## 2023-04-29 ENCOUNTER — Ambulatory Visit (HOSPITAL_COMMUNITY)
Admission: RE | Admit: 2023-04-29 | Discharge: 2023-04-29 | Disposition: A | Discharge status: Home / Self Care | Payer: Medicaid Other | Source: Ambulatory Visit | Attending: Obstetrics and Gynecology | Admitting: Obstetrics and Gynecology

## 2023-04-29 ENCOUNTER — Other Ambulatory Visit: Payer: Self-pay | Admitting: Obstetrics and Gynecology

## 2023-04-29 ENCOUNTER — Other Ambulatory Visit (HOSPITAL_COMMUNITY): Payer: Medicaid Other

## 2023-04-29 DIAGNOSIS — O3680X0 Pregnancy with inconclusive fetal viability, not applicable or unspecified: Secondary | ICD-10-CM

## 2023-05-02 ENCOUNTER — Other Ambulatory Visit: Payer: Self-pay

## 2023-05-02 DIAGNOSIS — Z3481 Encounter for supervision of other normal pregnancy, first trimester: Secondary | ICD-10-CM

## 2023-05-02 MED ORDER — VITAFOL ULTRA 29-0.6-0.4-200 MG PO CAPS
1.0000 | ORAL_CAPSULE | Freq: Every day | ORAL | 11 refills | Status: DC
Start: 2023-05-02 — End: 2023-08-31

## 2023-05-06 ENCOUNTER — Ambulatory Visit: Payer: Medicaid Other | Admitting: Physical Therapy

## 2023-05-10 ENCOUNTER — Other Ambulatory Visit: Payer: Self-pay

## 2023-05-10 ENCOUNTER — Ambulatory Visit (INDEPENDENT_AMBULATORY_CARE_PROVIDER_SITE_OTHER): Payer: Medicaid Other

## 2023-05-10 VITALS — BP 126/85 | HR 78 | Wt 150.6 lb

## 2023-05-10 DIAGNOSIS — O368390 Maternal care for abnormalities of the fetal heart rate or rhythm, unspecified trimester, not applicable or unspecified: Secondary | ICD-10-CM

## 2023-05-10 DIAGNOSIS — Z3A01 Less than 8 weeks gestation of pregnancy: Secondary | ICD-10-CM

## 2023-05-10 DIAGNOSIS — Z1339 Encounter for screening examination for other mental health and behavioral disorders: Secondary | ICD-10-CM

## 2023-05-10 DIAGNOSIS — Z3481 Encounter for supervision of other normal pregnancy, first trimester: Secondary | ICD-10-CM

## 2023-05-10 DIAGNOSIS — Z3480 Encounter for supervision of other normal pregnancy, unspecified trimester: Secondary | ICD-10-CM | POA: Insufficient documentation

## 2023-05-10 DIAGNOSIS — O3680X0 Pregnancy with inconclusive fetal viability, not applicable or unspecified: Secondary | ICD-10-CM

## 2023-05-10 MED ORDER — BLOOD PRESSURE KIT DEVI
1.0000 | 0 refills | Status: DC
Start: 2023-05-10 — End: 2024-04-28

## 2023-05-10 NOTE — Progress Notes (Signed)
New OB Intake  I connected with Andrea Archer  on 05/10/23 at  9:00 AM EDT by in person and verified that I am speaking with the correct person using two identifiers. Nurse is located at [facility] and pt is located at [place].  I discussed the limitations, risks, security and privacy concerns of performing an evaluation and management service by telephone and the availability of in person appointments. I also discussed with the patient that there may be a patient responsible charge related to this service. The patient expressed understanding and agreed to proceed.  I explained I am completing New OB Intake today. We discussed EDD of 12/20/23 that is based on LMP of 03/15/23. Pt is G3/P1011. I reviewed her allergies, medications, Medical/Surgical/OB history, and appropriate screenings. I informed her of [facility] services. [facility] information placed in AVS. Based on history, this is a low risk pregnancy.  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 Patient given information for [facility] website for more [facility]. Patient is interested in water birth. Offered upcoming OB visit with CNM to discuss further.  MyChart/Babyscripts MyChart access verified. I explained pt will have some visits in office and some virtually. Babyscripts instructions given and order placed. Patient verifies receipt of registration text/e-mail. Account successfully created and app downloaded.  Blood Pressure Cuff/Weight Scale Blood pressure cuff ordered for patient to pick-up from [employer]. Explained after first prenatal appt pt will check weekly and  document in Babyscripts. Patient does have weight scale.  Anatomy US Explained first scheduled [place] will be around 19 weeks. Anatomy US TBD.   Labs Discussed Avelina Laine genetic screening with patient. Would like both Panorama and Horizon drawn at new OB visit. Routine prenatal labs needed.  COVID Vaccine Patient has not had COVID vaccine.   Is patient a CenteringPregnancy candidate?  Declined Declined due to Group setting Not a candidate due to  If accepted,    Social Determinants of Health Food Insecurity: Patient denies food insecurity. WIC Referral: Patient is interested in referral to [facility]  Transportation: Patient denies transportation needs. Childcare: Discussed no children allowed at ultrasound appointments. Offered childcare services; patient declines childcare services at this time.  Interested in [facility]? If yes, send referral and doula dot phrase.   First visit review I reviewed new OB appt with patient. I explained they will have a provider visit that includes discuss plan of care for pregnancy. Explained pt will be seen by Coral Ceo at first visit; encounter routed to appropriate provider. Explained that patient will be seen by pregnancy navigator following visit with provider.   Hamilton Capri, RN 05/10/2023  10:02 AM

## 2023-05-11 LAB — CBC/D/PLT+RPR+RH+ABO+RUBIGG...
Antibody Screen: NEGATIVE
Basophils Absolute: 0.1 10*3/uL (ref 0.0–0.2)
Basos: 1 %
EOS (ABSOLUTE): 0.5 10*3/uL — ABNORMAL HIGH (ref 0.0–0.4)
Eos: 5 %
HCV Ab: NONREACTIVE
HIV Screen 4th Generation wRfx: NONREACTIVE
Hematocrit: 40.5 % (ref 34.0–46.6)
Hemoglobin: 13.5 g/dL (ref 11.1–15.9)
Hepatitis B Surface Ag: NEGATIVE
Immature Grans (Abs): 0 10*3/uL (ref 0.0–0.1)
Immature Granulocytes: 0 %
Lymphocytes Absolute: 2.7 10*3/uL (ref 0.7–3.1)
Lymphs: 25 %
MCH: 28 pg (ref 26.6–33.0)
MCHC: 33.3 g/dL (ref 31.5–35.7)
MCV: 84 fL (ref 79–97)
Monocytes Absolute: 0.8 10*3/uL (ref 0.1–0.9)
Monocytes: 8 %
Neutrophils Absolute: 6.7 10*3/uL (ref 1.4–7.0)
Neutrophils: 61 %
Platelets: 304 10*3/uL (ref 150–450)
RBC: 4.82 x10E6/uL (ref 3.77–5.28)
RDW: 13.7 % (ref 11.7–15.4)
RPR Ser Ql: NONREACTIVE
Rh Factor: POSITIVE
Rubella Antibodies, IGG: <0.9 index — ABNORMAL LOW (ref >0.99)
WBC: 10.8 10*3/uL (ref 3.4–10.8)

## 2023-05-11 LAB — HCV INTERPRETATION

## 2023-05-12 LAB — URINE CULTURE, OB REFLEX

## 2023-05-12 LAB — CULTURE, OB URINE

## 2023-05-13 ENCOUNTER — Encounter: Payer: Self-pay | Admitting: Obstetrics and Gynecology

## 2023-05-13 DIAGNOSIS — O09899 Supervision of other high risk pregnancies, unspecified trimester: Secondary | ICD-10-CM | POA: Insufficient documentation

## 2023-05-24 ENCOUNTER — Other Ambulatory Visit: Payer: Medicaid Other

## 2023-05-24 ENCOUNTER — Inpatient Hospital Stay (HOSPITAL_COMMUNITY)
Admission: AD | Admit: 2023-05-24 | Discharge: 2023-05-24 | Discharge status: Against Medical Advice | Payer: Medicaid Other | Attending: Obstetrics & Gynecology | Admitting: Obstetrics & Gynecology

## 2023-05-24 DIAGNOSIS — Z3480 Encounter for supervision of other normal pregnancy, unspecified trimester: Secondary | ICD-10-CM

## 2023-05-24 NOTE — MAU Note (Signed)
Went to triage patent and she has left . Signed AMA form with registration

## 2023-05-28 LAB — HORIZON CUSTOM

## 2023-05-30 ENCOUNTER — Encounter: Payer: Self-pay | Admitting: Advanced Practice Midwife

## 2023-06-01 LAB — PANORAMA PRENATAL TEST FULL PANEL:PANORAMA TEST PLUS 5 ADDITIONAL MICRODELETIONS: FETAL FRACTION: 6.2

## 2023-06-05 ENCOUNTER — Ambulatory Visit: Payer: Medicaid Other

## 2023-06-05 ENCOUNTER — Ambulatory Visit (INDEPENDENT_AMBULATORY_CARE_PROVIDER_SITE_OTHER): Payer: Medicaid Other | Admitting: *Deleted

## 2023-06-05 VITALS — BP 111/73 | HR 77

## 2023-06-05 DIAGNOSIS — R300 Dysuria: Secondary | ICD-10-CM

## 2023-06-05 DIAGNOSIS — Z3480 Encounter for supervision of other normal pregnancy, unspecified trimester: Secondary | ICD-10-CM

## 2023-06-05 LAB — POCT URINALYSIS DIPSTICK
Bilirubin, UA: NEGATIVE
Blood, UA: NEGATIVE
Glucose, UA: NEGATIVE
Ketones, UA: NEGATIVE
Leukocytes, UA: NEGATIVE
Nitrite, UA: NEGATIVE
Protein, UA: NEGATIVE
Spec Grav, UA: 1.015 (ref 1.010–1.025)
Urobilinogen, UA: 0.2 E.U./dL
pH, UA: 5.5 (ref 5.0–8.0)

## 2023-06-05 NOTE — Progress Notes (Signed)
SUBJECTIVE: Andrea Archer is a 22 y.o. female who complains of urinary frequency, urgency and dysuria x 7 days, without flank pain, fever, chills, or abnormal vaginal discharge or bleeding.   OBJECTIVE: Appears well, in no apparent distress.  Vital signs are normal. Urine dipstick shows negative for all components.    ASSESSMENT: Dysuria  PLAN: Treatment per orders.  Call or return to clinic prn if these symptoms worsen or fail to improve as anticipated.

## 2023-06-10 ENCOUNTER — Encounter: Payer: Self-pay | Admitting: Obstetrics

## 2023-06-10 ENCOUNTER — Telehealth: Payer: Self-pay

## 2023-06-10 DIAGNOSIS — N390 Urinary tract infection, site not specified: Secondary | ICD-10-CM

## 2023-06-10 LAB — URINE CULTURE, OB REFLEX

## 2023-06-10 LAB — CULTURE, OB URINE

## 2023-06-10 MED ORDER — AMOXICILLIN-POT CLAVULANATE 875-125 MG PO TABS
1.0000 | ORAL_TABLET | Freq: Two times a day (BID) | ORAL | 1 refills | Status: DC
Start: 2023-06-10 — End: 2023-08-31

## 2023-06-10 NOTE — Telephone Encounter (Signed)
Patient sent mychart message regarding urine culture results.   Discussed with Dr. Clearance Coots. Okay to send Augmentin [phone] BID for 7 days.

## 2023-06-11 ENCOUNTER — Encounter: Payer: Self-pay | Admitting: Obstetrics

## 2023-06-11 ENCOUNTER — Ambulatory Visit (INDEPENDENT_AMBULATORY_CARE_PROVIDER_SITE_OTHER): Payer: Medicaid Other | Admitting: Obstetrics

## 2023-06-11 ENCOUNTER — Other Ambulatory Visit (HOSPITAL_COMMUNITY)
Admission: RE | Admit: 2023-06-11 | Discharge: 2023-06-11 | Disposition: A | Discharge status: Home / Self Care | Payer: Medicaid Other | Source: Ambulatory Visit | Attending: Obstetrics | Admitting: Obstetrics

## 2023-06-11 VITALS — BP 113/78 | HR 81

## 2023-06-11 DIAGNOSIS — Z3481 Encounter for supervision of other normal pregnancy, first trimester: Secondary | ICD-10-CM

## 2023-06-11 DIAGNOSIS — Z3A12 12 weeks gestation of pregnancy: Secondary | ICD-10-CM

## 2023-06-11 DIAGNOSIS — Z3480 Encounter for supervision of other normal pregnancy, unspecified trimester: Secondary | ICD-10-CM | POA: Insufficient documentation

## 2023-06-11 MED ORDER — CEPHALEXIN 500 MG PO CAPS
500.0000 mg | ORAL_CAPSULE | Freq: Four times a day (QID) | ORAL | 2 refills | Status: DC
Start: 2023-06-11 — End: 2023-08-31

## 2023-06-11 NOTE — Progress Notes (Signed)
Subjective:    Andrea Archer is being seen today for her first obstetrical visit.  This is not a planned pregnancy. She is at [redacted]w[redacted]d gestation. Her obstetrical history is significant for  none . Relationship with FOB: significant other, not living together. Patient does intend to breast feed. Pregnancy history fully reviewed.  The information documented in the HPI was reviewed and verified.  Menstrual History: OB History     Gravida  3   Para  1   Term  1   Preterm  0   AB  1   Living  1      SAB  1   IAB  0   Ectopic  0   Multiple  0   Live Births  1            Patient's last menstrual period was 03/15/2023 (exact date).    Past Medical History:  Diagnosis Date   Anemia    Anxiety    Bacterial vaginitis    Depression    Hypertension    with pregnancy   Pregnant    Sickle cell trait (HCC)    UTI (urinary tract infection)     Past Surgical History:  Procedure Laterality Date   FINGER SURGERY Left    Pinky Finger   FRACTURE SURGERY  01/09/2019   left pinky finger    (Not in a hospital admission)  Allergies  Allergen Reactions   Other     Henna - rash   Pineapple Rash    Social History   Tobacco Use   Smoking status: Former    Types: Cigars    Passive exposure: Past   Smokeless tobacco: Never   Tobacco comments:    Quit 2022  Substance Use Topics   Alcohol use: Not Currently    Comment: occ, not since confirmed pregnancy    Family History  Problem Relation Age of Onset   Cancer Mother        cervical ca 2023   Hypertension Mother    Hypertension Father    Heart disease Father    Sickle cell anemia Father    Sickle cell trait Father        has sickle cell   Diabetes Maternal Grandmother    Hypertension Maternal Grandmother    Heart disease Maternal Grandmother    Fibromyalgia Maternal Grandmother    Hypertension Maternal Grandfather    Pancreatic cancer Maternal Grandfather    Hypertension Paternal Grandmother    Hypertension  Paternal Grandfather    Colon cancer Neg Hx    Stomach cancer Neg Hx    Esophageal cancer Neg Hx    Rectal cancer Neg Hx      Review of Systems Constitutional: negative for weight loss Gastrointestinal: negative for vomiting Genitourinary:negative for genital lesions and vaginal discharge and dysuria Musculoskeletal:negative for back pain Behavioral/Psych: negative for abusive relationship, depression, illegal drug usage and tobacco use    Objective:    BP 113/78   Pulse 81   LMP 03/15/2023 (Exact Date)  General Appearance:    Alert, cooperative, no distress, appears stated age  Head:    Normocephalic, without obvious abnormality, atraumatic  Eyes:    PERRL, conjunctiva/corneas clear, EOM's intact, fundi    benign, both eyes  Ears:    Normal TM's and external ear canals, both ears  Nose:   Nares normal, septum midline, mucosa normal, no drainage    or sinus tenderness  Throat:   Lips, mucosa, and tongue  normal; teeth and gums normal  Neck:   Supple, symmetrical, trachea midline, no adenopathy;    thyroid:  no enlargement/tenderness/nodules; no carotid   bruit or JVD  Back:     Symmetric, no curvature, ROM normal, no CVA tenderness  Lungs:     Clear to auscultation bilaterally, respirations unlabored  Chest Wall:    No tenderness or deformity   Heart:    Regular rate and rhythm, S1 and S2 normal, no murmur, rub   or gallop  Breast Exam:    No tenderness, masses, or nipple abnormality  Abdomen:     Soft, non-tender, bowel sounds active all four quadrants,    no masses, no organomegaly  Genitalia:    Normal female without lesion, discharge or tenderness  Extremities:   Extremities normal, atraumatic, no cyanosis or edema  Pulses:   2+ and symmetric all extremities  Skin:   Skin color, texture, turgor normal, no rashes or lesions  Lymph nodes:   Cervical, supraclavicular, and axillary nodes normal  Neurologic:   CNII-XII intact, normal strength, sensation and reflexes     throughout      Lab Review Urine pregnancy test Labs reviewed yes Radiologic studies reviewed no  Assessment:     Pregnancy at [redacted]w[redacted]d weeks    Plan:    1. Supervision of other normal pregnancy, antepartum Rx: - HgB A1c - Cervicovaginal ancillary only( [place])   Prenatal vitamins.  Counseling provided regarding continued use of seat belts, cessation of alcohol consumption, smoking or use of illicit drugs; infection precautions i.e., influenza/TDAP immunizations, toxoplasmosis,CMV, parvovirus, listeria and varicella; workplace safety, exercise during pregnancy; routine dental care, safe medications, sexual activity, hot tubs, saunas, pools, travel, caffeine use, fish and methlymercury, potential toxins, hair treatments, varicose veins Weight gain recommendations per IOM guidelines reviewed: underweight/BMI< 18.5--> gain 28 - 40 lbs; normal weight/BMI 18.5 - 24.9--> gain 25 - 35 lbs; overweight/BMI 25 - 29.9--> gain 15 - 25 lbs; obese/BMI >30->gain  11 - 20 lbs Problem list reviewed and updated. FIRST/CF mutation testing/NIPT/QUAD SCREEN/fragile X/[name] population testing/Spinal muscular atrophy discussed: requested. Role of ultrasound in pregnancy discussed; fetal survey: requested. Amniocentesis discussed: not indicated.  No orders of the defined types were placed in this encounter.  Orders Placed This Encounter  Procedures   HgB A1c    Follow up in 4 weeks.  I have spent a total of 20 minutes of face-to-face time, excluding clinical staff time, reviewing notes and preparing to see patient, ordering tests and/or medications, and counseling the patient.   Brock Bad, MD 06/11/2023 10:48 AM

## 2023-06-11 NOTE — Addendum Note (Signed)
Addended by: Catalina Antigua on: 06/11/2023 11:53 AM   Modules accepted: Orders

## 2023-06-11 NOTE — Progress Notes (Signed)
NOB, c/o HA 8-10/10 x 5 days, pelvic pain.

## 2023-06-12 LAB — CERVICOVAGINAL ANCILLARY ONLY
Bacterial Vaginitis (gardnerella): NEGATIVE
Candida Glabrata: NEGATIVE
Candida Vaginitis: NEGATIVE
Chlamydia: NEGATIVE
Comment: NEGATIVE
Comment: NEGATIVE
Comment: NEGATIVE
Comment: NEGATIVE
Comment: NEGATIVE
Comment: NORMAL
Neisseria Gonorrhea: NEGATIVE
Trichomonas: NEGATIVE

## 2023-06-12 LAB — HEMOGLOBIN A1C
Est. average glucose Bld gHb Est-mCnc: 97 mg/dL
Hgb A1c MFr Bld: 5 % (ref 4.8–5.6)

## 2023-06-12 NOTE — Addendum Note (Signed)
Addended by: Natale Milch D on: 06/12/2023 04:14 PM   Modules accepted: Orders

## 2023-06-14 ENCOUNTER — Other Ambulatory Visit: Payer: Self-pay

## 2023-06-14 ENCOUNTER — Telehealth: Payer: Self-pay

## 2023-06-14 DIAGNOSIS — Z3480 Encounter for supervision of other normal pregnancy, unspecified trimester: Secondary | ICD-10-CM

## 2023-06-14 MED ORDER — VITAFOL GUMMIES 3.33-0.333-34.8 MG PO CHEW
1.0000 | CHEWABLE_TABLET | Freq: Every day | ORAL | 5 refills | Status: DC
Start: 2023-06-14 — End: 2024-01-28

## 2023-06-14 NOTE — Telephone Encounter (Signed)
Patient left a voicemail, returning patient phone call. Patient states she would like prenatal gummy vitamins sent to [facility] in [place].  Rx sent

## 2023-06-24 ENCOUNTER — Ambulatory Visit (INDEPENDENT_AMBULATORY_CARE_PROVIDER_SITE_OTHER): Payer: Medicaid Other

## 2023-06-24 VITALS — BP 112/78 | HR 78 | Ht 67.0 in | Wt 153.4 lb

## 2023-06-24 DIAGNOSIS — R300 Dysuria: Secondary | ICD-10-CM | POA: Diagnosis not present

## 2023-06-24 LAB — POCT URINALYSIS DIPSTICK OB
Bilirubin, UA: NEGATIVE
Blood, UA: NEGATIVE
Glucose, UA: NEGATIVE
Ketones, UA: NEGATIVE
Leukocytes, UA: NEGATIVE
Nitrite, UA: NEGATIVE
POC,PROTEIN,UA: NEGATIVE
Spec Grav, UA: 1.01 (ref 1.010–1.025)
Urobilinogen, UA: 0.2 E.U./dL
pH, UA: 7 (ref 5.0–8.0)

## 2023-06-24 NOTE — Progress Notes (Signed)
SUBJECTIVE: Andrea Archer is a 22 y.o. female who complains of urinary frequency, urgency and dysuria x 3 days, without flank pain, fever, chills, or abnormal vaginal discharge or bleeding.   OBJECTIVE: Appears well, in no apparent distress.  Vital signs are normal. Urine dipstick shows negative for all components.    ASSESSMENT: Dysuria  PLAN: Urine sent for culture, will treat depending on Urine Culture results.  Call or return to clinic prn if these symptoms worsen or fail to improve as anticipated.

## 2023-06-26 ENCOUNTER — Other Ambulatory Visit: Payer: Self-pay

## 2023-06-26 ENCOUNTER — Inpatient Hospital Stay (HOSPITAL_COMMUNITY)
Admission: AD | Admit: 2023-06-26 | Discharge: 2023-06-27 | Disposition: A | Discharge status: Home / Self Care | Payer: Medicaid Other | Attending: Obstetrics and Gynecology | Admitting: Obstetrics and Gynecology

## 2023-06-26 ENCOUNTER — Encounter (HOSPITAL_COMMUNITY): Payer: Self-pay | Admitting: Obstetrics and Gynecology

## 2023-06-26 DIAGNOSIS — M79604 Pain in right leg: Secondary | ICD-10-CM | POA: Insufficient documentation

## 2023-06-26 DIAGNOSIS — B348 Other viral infections of unspecified site: Secondary | ICD-10-CM

## 2023-06-26 DIAGNOSIS — Z3A14 14 weeks gestation of pregnancy: Secondary | ICD-10-CM | POA: Diagnosis not present

## 2023-06-26 DIAGNOSIS — O98512 Other viral diseases complicating pregnancy, second trimester: Secondary | ICD-10-CM | POA: Insufficient documentation

## 2023-06-26 DIAGNOSIS — O26892 Other specified pregnancy related conditions, second trimester: Secondary | ICD-10-CM | POA: Diagnosis present

## 2023-06-26 DIAGNOSIS — Z1152 Encounter for screening for COVID-19: Secondary | ICD-10-CM | POA: Insufficient documentation

## 2023-06-26 DIAGNOSIS — O99012 Anemia complicating pregnancy, second trimester: Secondary | ICD-10-CM | POA: Diagnosis not present

## 2023-06-26 DIAGNOSIS — R52 Pain, unspecified: Secondary | ICD-10-CM | POA: Diagnosis not present

## 2023-06-26 DIAGNOSIS — O10912 Unspecified pre-existing hypertension complicating pregnancy, second trimester: Secondary | ICD-10-CM | POA: Insufficient documentation

## 2023-06-26 LAB — CBC WITH DIFFERENTIAL/PLATELET
Abs Immature Granulocytes: 0.04 10*3/uL (ref 0.00–0.07)
Basophils Absolute: 0.1 10*3/uL (ref 0.0–0.1)
Basophils Relative: 1 %
Eosinophils Absolute: 0.3 10*3/uL (ref 0.0–0.5)
Eosinophils Relative: 3 %
HCT: 36 % (ref 36.0–46.0)
Hemoglobin: 12.8 g/dL (ref 12.0–15.0)
Immature Granulocytes: 0 %
Lymphocytes Relative: 17 %
Lymphs Abs: 1.6 10*3/uL (ref 0.7–4.0)
MCH: 29.3 pg (ref 26.0–34.0)
MCHC: 35.6 g/dL (ref 30.0–36.0)
MCV: 82.4 fL (ref 80.0–100.0)
Monocytes Absolute: 1.3 10*3/uL — ABNORMAL HIGH (ref 0.1–1.0)
Monocytes Relative: 14 %
Neutro Abs: 6.1 10*3/uL (ref 1.7–7.7)
Neutrophils Relative %: 65 %
Platelets: 216 10*3/uL (ref 150–400)
RBC: 4.37 MIL/uL (ref 3.87–5.11)
RDW: 13.2 % (ref 11.5–15.5)
WBC: 9.3 10*3/uL (ref 4.0–10.5)
nRBC: 0 % (ref 0.0–0.2)

## 2023-06-26 LAB — WET PREP, GENITAL
Clue Cells Wet Prep HPF POC: NONE SEEN
Sperm: NONE SEEN
Trich, Wet Prep: NONE SEEN
WBC, Wet Prep HPF POC: <10 (ref <10)
Yeast Wet Prep HPF POC: NONE SEEN

## 2023-06-26 LAB — COMPREHENSIVE METABOLIC PANEL
ALT: 13 U/L (ref 0–44)
AST: 15 U/L (ref 15–41)
Albumin: 3.2 g/dL — ABNORMAL LOW (ref 3.5–5.0)
Alkaline Phosphatase: 44 U/L (ref 38–126)
Anion gap: 7 (ref 5–15)
BUN: <5 mg/dL — ABNORMAL LOW (ref 6–20)
CO2: 20 mmol/L — ABNORMAL LOW (ref 22–32)
Calcium: 8.8 mg/dL — ABNORMAL LOW (ref 8.9–10.3)
Chloride: 106 mmol/L (ref 98–111)
Creatinine, Ser: 0.79 mg/dL (ref 0.44–1.00)
GFR, Estimated: >60 mL/min (ref >60)
Glucose, Bld: 85 mg/dL (ref 70–99)
Potassium: 3.6 mmol/L (ref 3.5–5.1)
Sodium: 133 mmol/L — ABNORMAL LOW (ref 135–145)
Total Bilirubin: 0.3 mg/dL (ref 0.3–1.2)
Total Protein: 6.2 g/dL — ABNORMAL LOW (ref 6.5–8.1)

## 2023-06-26 LAB — URINALYSIS, ROUTINE W REFLEX MICROSCOPIC
Bilirubin Urine: NEGATIVE
Glucose, UA: 50 mg/dL — AB
Hgb urine dipstick: NEGATIVE
Ketones, ur: NEGATIVE mg/dL
Leukocytes,Ua: NEGATIVE
Nitrite: NEGATIVE
Protein, ur: NEGATIVE mg/dL
Specific Gravity, Urine: 1.01 (ref 1.005–1.030)
pH: 5 (ref 5.0–8.0)

## 2023-06-26 LAB — SARS CORONAVIRUS 2 BY RT PCR: SARS Coronavirus 2 by RT PCR: NEGATIVE

## 2023-06-26 LAB — URINE CULTURE, OB REFLEX

## 2023-06-26 LAB — CULTURE, OB URINE

## 2023-06-26 MED ORDER — CYCLOBENZAPRINE HCL 5 MG PO TABS
10.0000 mg | ORAL_TABLET | Freq: Once | ORAL | Status: AC
  Administered 2023-06-26: 10 mg via ORAL
  Filled 2023-06-26: qty 2

## 2023-06-26 MED ORDER — ACETAMINOPHEN 500 MG PO TABS: 1000.0000 mg | ORAL_TABLET | Freq: Once | ORAL | Status: DC

## 2023-06-26 MED ORDER — ACETAMINOPHEN-CAFFEINE 500-65 MG PO TABS
1.0000 | ORAL_TABLET | Freq: Once | ORAL | Status: AC
  Administered 2023-06-26: 1 via ORAL
  Filled 2023-06-26: qty 1

## 2023-06-26 NOTE — MAU Provider Note (Incomplete)
History     CSN: [id number]  Arrival date and time: 06/26/23 2027   Event Date/Time   First Provider Initiated Contact with Patient 06/26/23 2135      Chief Complaint  Patient presents with  . Abdominal Pain  . Back Pain  . Facial Pain   Athanasia Stanwood , a  22 y.o. G3P1011 at [redacted]w[redacted]d presents to MAU with multiple complaints. She reports on-going abdomen and back pain for the last 2 weeks that has recently worsened in the last 2 days. She states that the pain is "sharp and constant." She states that pain is worsened by both fetal & maternal movement, especially on the right side. She states that pain is briefly relieved by intercourse. Last noted today. She currently rates both abdominal and back pain a 10/10 with no attempts to relieve pain. She was recently treated for a UTI and reports completing antibiotic therapy. She believes her urine is "cloudy" and is concerned. She denies abnormal vaginal discharge but notes if she "sits for too long then she notices that she is extra moist." She denies nausea and vomiting but states no matter what she does "she feels dehydrated." Endorses drinking water and tea with no problems. Denies problems with diarrhea or constipation. Denies seasonal allergies.   She also reports a headache and chest pain that started today. She rates them both a 10/10. Reports her head hurts right across the front of her forehead. She denies pain worsened by light or sound and denies visual changes. She reports sinus pressure and pain behind her eyes with nasal congestion and reports that her nose is swollen. She denies sick contacts and denies attempting to relieve symptoms. Her chest pain she states is "sharp" and intermittent. She states that is does not last long with no worsening or alleviating symptoms.   She also reports a sore throat that also started today. She rates pain a 10/10. States this is "the worse she has ever felt." Denies difficulty swallowing.    She also  complains of butt pain that radiates down the back side of her legs that makes it difficult to walk. She also rates this pain a 10/10, denies worsening or alleviating symptoms. Denies attempting to relieve.           OB History     Gravida  3   Para  1   Term  1   Preterm  0   AB  1   Living  1      SAB  1   IAB  0   Ectopic  0   Multiple  0   Live Births  1           Past Medical History:  Diagnosis Date  . Anemia   . Anxiety   . Bacterial vaginitis   . Depression   . Hypertension    with pregnancy  . Pregnant   . Sickle cell trait (HCC)   . UTI (urinary tract infection)     Past Surgical History:  Procedure Laterality Date  . FINGER SURGERY Left    Pinky Finger  . FRACTURE SURGERY  01/09/2019   left pinky finger    Family History  Problem Relation Age of Onset  . Cancer Mother        cervical ca 2023  . Hypertension Mother   . Hypertension Father   . Heart disease Father   . Sickle cell anemia Father   . Sickle cell trait Father  has sickle cell  . Diabetes Maternal Grandmother   . Hypertension Maternal Grandmother   . Heart disease Maternal Grandmother   . Fibromyalgia Maternal Grandmother   . Hypertension Maternal Grandfather   . Pancreatic cancer Maternal Grandfather   . Hypertension Paternal Grandmother   . Hypertension Paternal Grandfather   . Colon cancer Neg Hx   . Stomach cancer Neg Hx   . Esophageal cancer Neg Hx   . Rectal cancer Neg Hx     Social History   Tobacco Use  . Smoking status: Former    Types: Cigars    Passive exposure: Past  . Smokeless tobacco: Never  . Tobacco comments:    Quit 2022  Vaping Use  . Vaping Use: Never used  Substance Use Topics  . Alcohol use: Not Currently    Comment: occ, not since confirmed pregnancy  . Drug use: No    Allergies:  Allergies  Allergen Reactions  . Other     Henna - rash  . Pineapple Rash    Medications Prior to Admission  Medication Sig  Dispense Refill Last Dose  . Prenatal Vit-Fe Phos-FA-Omega (VITAFOL GUMMIES) 3.33-0.333-34.8 MG CHEW Chew 1 tablet by mouth daily. 90 tablet 5 06/26/2023  . amoxicillin-clavulanate (AUGMENTIN) 875-125 MG tablet Take 1 tablet by mouth 2 (two) times daily. (Patient not taking: Reported on 06/24/2023) 20 tablet 1   . Blood Pressure Monitoring (BLOOD PRESSURE KIT) DEVI 1 kit by Does not apply route once a week. 1 each 0   . cephALEXin (KEFLEX) 500 MG capsule Take 1 capsule (500 mg total) by mouth 4 (four) times daily. (Patient not taking: Reported on 06/24/2023) 28 capsule 2   . Prenat-Fe Poly-Methfol-FA-DHA (VITAFOL ULTRA) 29-0.6-0.4-200 MG CAPS Take 1 capsule by mouth daily. (Patient not taking: Reported on 06/24/2023) 30 capsule 11   . valACYclovir (VALTREX) 1000 MG tablet Take 1 tablet (1,000 mg total) by mouth 2 (two) times daily. Take 1 tablet twice a day for 10 days. (Patient not taking: Reported on 06/24/2023) 20 tablet 11     Review of Systems  Constitutional:  Positive for appetite change. Negative for chills, fatigue and fever.  HENT:  Positive for congestion, facial swelling, rhinorrhea, sinus pressure, sinus pain and sore throat.   Eyes:  Negative for photophobia, pain and visual disturbance.  Respiratory:  Negative for apnea, shortness of breath and wheezing.   Cardiovascular:  Positive for chest pain. Negative for palpitations.  Gastrointestinal:  Positive for abdominal pain. Negative for constipation, diarrhea, nausea and vomiting.  Genitourinary:  Positive for flank pain, pelvic pain and vaginal pain. Negative for difficulty urinating, dysuria, vaginal bleeding and vaginal discharge.  Musculoskeletal:  Positive for back pain.  Allergic/Immunologic: Negative for environmental allergies and food allergies.  Neurological:  Positive for weakness and headaches. Negative for seizures.  Psychiatric/Behavioral:  Negative for suicidal ideas.    Physical Exam   Blood pressure 109/65, pulse (!)  104, temperature 98.5 F (36.9 C), temperature source Oral, resp. rate 19, height 5\' 7"  (1.702 m), weight 70.2 kg, last menstrual period 03/15/2023, SpO2 99 %.  Physical Exam Vitals (Patient drinking tea from cookout on her phone upon arrival to room) and nursing note reviewed.  Constitutional:      General: She is not in acute distress.    Appearance: Normal appearance. She is not ill-appearing.  HENT:     Head: Normocephalic.  Cardiovascular:     Rate and Rhythm: Normal rate and regular rhythm.  Pulmonary:  Effort: Pulmonary effort is normal.  Abdominal:     Palpations: Abdomen is soft.     Tenderness: There is generalized abdominal tenderness and tenderness in the right lower quadrant and left lower quadrant. There is no right CVA tenderness or left CVA tenderness.     Comments: Patient grimaces at light palpation.   Musculoskeletal:     Cervical back: Normal range of motion.  Skin:    General: Skin is warm and dry.     Capillary Refill: Capillary refill takes 2 to 3 seconds.  Neurological:     Mental Status: She is alert and oriented to person, place, and time.  Psychiatric:        Mood and Affect: Mood normal.    FHT obtained in triage  MAU Course  Procedures Orders Placed This Encounter  Procedures  . Respiratory (~20 pathogens) panel by PCR  . Wet prep, genital  . SARS Coronavirus 2 by RT PCR (hospital order, performed in [facility] hospital lab) *cepheid single result test*  . Urinalysis, Routine w reflex microscopic -Urine, Clean Catch  . CBC with Differential/Platelet  . Comprehensive metabolic panel  . Droplet precaution  . ED EKG    MDM - Normal EKG. Normal Sinus Rhythm.  -  Wet prep normal  - UA normal, low suspicion for Dehydration  -   Assessment and Plan  ***  Claudette Head MSN, CNM  06/26/2023, 9:35 PM

## 2023-06-26 NOTE — MAU Note (Signed)
.  Andrea Archer is a 22 y.o. at 108w5d here in MAU reporting: minor cramps for the past 2 weeks, but for the past 2 days cramps have been more intense. Nose is swollen, sore throat, sinus pain, lower back pain, pain makes legs and bottom and hurt. States urine looks cloudy. States when baby moves lower right side of abdomen will hurt more. Denies VB, abnormal vaginal discharge, or irritation. States the other day had burning sensation while wiping after using the bathroom, but has not felt it since. Reports feeling dehydrated. Denies taking any medication for pain.   Pain score:  10 - abdomen; 10 - back; 10 - sinuses; 10 - throat   Vitals:   06/26/23 2056  BP: 120/77  Pulse: (!) 102  Resp: 19  Temp: 98.5 F (36.9 C)  SpO2: 99%     FHT:165 Lab orders placed from triage:  UA

## 2023-06-26 NOTE — MAU Provider Note (Signed)
History     CSN: [id number]  Arrival date and time: 06/26/23 2027   Event Date/Time   First Provider Initiated Contact with Patient 06/26/23 2135      Chief Complaint  Patient presents with   Abdominal Pain   Back Pain   Facial Pain   Andrea Archer , a  22 y.o. G3P1011 at [redacted]w[redacted]d presents to MAU with multiple complaints. She reports on-going abdomen and back pain for the last 2 weeks that has recently worsened in the last 2 days. She states that the pain is "sharp and constant." She states that pain is worsened by both fetal & maternal movement, especially on the right side. She states that pain is briefly relieved by intercourse. Last noted today. She currently rates both abdominal and back pain a 10/10 with no attempts to relieve pain. She was recently treated for a UTI and reports completing antibiotic therapy. She believes her urine is "cloudy" and is concerned. She denies abnormal vaginal discharge but notes if she "sits for too long then she notices that she is extra moist." She denies nausea and vomiting but states no matter what she does "she feels dehydrated." Endorses drinking water and tea with no problems. Denies problems with diarrhea or constipation. Denies seasonal allergies.   She also reports a headache and chest pain that started today. She rates them both a 10/10. Reports her head hurts right across the front of her forehead. She denies pain worsened by light or sound and denies visual changes. She reports sinus pressure and pain behind her eyes with nasal congestion and reports that her nose is swollen. She denies sick contacts and denies attempting to relieve symptoms. Her chest pain she states is "sharp" and intermittent. She states that is does not last long with no worsening or alleviating symptoms.   She also reports a sore throat that also started today. She rates pain a 10/10. States this is "the worse she has ever felt." Denies difficulty swallowing.    She also  complains of butt pain that radiates down the back side of her legs that makes it difficult to walk. She also rates this pain a 10/10, denies worsening or alleviating symptoms. Denies attempting to relieve.          OB History     Gravida  3   Para  1   Term  1   Preterm  0   AB  1   Living  1      SAB  1   IAB  0   Ectopic  0   Multiple  0   Live Births  1           Past Medical History:  Diagnosis Date   Anemia    Anxiety    Bacterial vaginitis    Depression    Hypertension    with pregnancy   Pregnant    Sickle cell trait (HCC)    UTI (urinary tract infection)     Past Surgical History:  Procedure Laterality Date   FINGER SURGERY Left    Pinky Finger   FRACTURE SURGERY  01/09/2019   left pinky finger    Family History  Problem Relation Age of Onset   Cancer Mother        cervical ca 2023   Hypertension Mother    Hypertension Father    Heart disease Father    Sickle cell anemia Father    Sickle cell trait Father  has sickle cell   Diabetes Maternal Grandmother    Hypertension Maternal Grandmother    Heart disease Maternal Grandmother    Fibromyalgia Maternal Grandmother    Hypertension Maternal Grandfather    Pancreatic cancer Maternal Grandfather    Hypertension Paternal Grandmother    Hypertension Paternal Grandfather    Colon cancer Neg Hx    Stomach cancer Neg Hx    Esophageal cancer Neg Hx    Rectal cancer Neg Hx     Social History   Tobacco Use   Smoking status: Former    Types: Cigars    Passive exposure: Past   Smokeless tobacco: Never   Tobacco comments:    Quit 2022  Vaping Use   Vaping Use: Never used  Substance Use Topics   Alcohol use: Not Currently    Comment: occ, not since confirmed pregnancy   Drug use: No    Allergies:  Allergies  Allergen Reactions   Other     Henna - rash   Pineapple Rash    Medications Prior to Admission  Medication Sig Dispense Refill Last Dose   Prenatal Vit-Fe  Phos-FA-Omega (VITAFOL GUMMIES) 3.33-0.333-34.8 MG CHEW Chew 1 tablet by mouth daily. 90 tablet 5 06/26/2023   amoxicillin-clavulanate (AUGMENTIN) 875-125 MG tablet Take 1 tablet by mouth 2 (two) times daily. (Patient not taking: Reported on 06/24/2023) 20 tablet 1    Blood Pressure Monitoring (BLOOD PRESSURE KIT) DEVI 1 kit by Does not apply route once a week. 1 each 0    cephALEXin (KEFLEX) 500 MG capsule Take 1 capsule (500 mg total) by mouth 4 (four) times daily. (Patient not taking: Reported on 06/24/2023) 28 capsule 2    Prenat-Fe Poly-Methfol-FA-DHA (VITAFOL ULTRA) 29-0.6-0.4-200 MG CAPS Take 1 capsule by mouth daily. (Patient not taking: Reported on 06/24/2023) 30 capsule 11    valACYclovir (VALTREX) 1000 MG tablet Take 1 tablet (1,000 mg total) by mouth 2 (two) times daily. Take 1 tablet twice a day for 10 days. (Patient not taking: Reported on 06/24/2023) 20 tablet 11     Review of Systems  Constitutional:  Positive for appetite change. Negative for chills, fatigue and fever.  HENT:  Positive for congestion, facial swelling, rhinorrhea, sinus pressure, sinus pain and sore throat.   Eyes:  Negative for photophobia, pain and visual disturbance.  Respiratory:  Negative for apnea, shortness of breath and wheezing.   Cardiovascular:  Positive for chest pain. Negative for palpitations.  Gastrointestinal:  Positive for abdominal pain. Negative for constipation, diarrhea, nausea and vomiting.  Genitourinary:  Positive for flank pain, pelvic pain and vaginal pain. Negative for difficulty urinating, dysuria, vaginal bleeding and vaginal discharge.  Musculoskeletal:  Positive for back pain.  Allergic/Immunologic: Negative for environmental allergies and food allergies.  Neurological:  Positive for weakness and headaches. Negative for seizures.  Psychiatric/Behavioral:  Negative for suicidal ideas.    Physical Exam   Blood pressure 109/65, pulse (!) 104, temperature 98.5 F (36.9 C), temperature  source Oral, resp. rate 19, height 5\' 7"  (1.702 m), weight 70.2 kg, last menstrual period 03/15/2023, SpO2 99 %.  Physical Exam Vitals (Patient drinking tea from cookout on her phone upon arrival to room) and nursing note reviewed.  Constitutional:      General: She is not in acute distress.    Appearance: Normal appearance. She is not ill-appearing.  HENT:     Head: Normocephalic.  Cardiovascular:     Rate and Rhythm: Normal rate and regular rhythm.  Pulmonary:  Effort: Pulmonary effort is normal.  Abdominal:     Palpations: Abdomen is soft.     Tenderness: There is generalized abdominal tenderness and tenderness in the right lower quadrant and left lower quadrant. There is no right CVA tenderness or left CVA tenderness.     Comments: Patient grimaces at light palpation.   Musculoskeletal:     Cervical back: Normal range of motion.  Skin:    General: Skin is warm and dry.     Capillary Refill: Capillary refill takes 2 to 3 seconds.  Neurological:     Mental Status: She is alert and oriented to person, place, and time.  Psychiatric:        Mood and Affect: Mood normal.   FHT obtained in triage  MAU Course  Procedures Orders Placed This Encounter  Procedures   Respiratory (~20 pathogens) panel by PCR   Wet prep, genital   SARS Coronavirus 2 by RT PCR (hospital order, performed in [facility] hospital lab) *cepheid single result test*   Urinalysis, Routine w reflex microscopic -Urine, Clean Catch   CBC with Differential/Platelet   Comprehensive metabolic panel   Droplet precaution   ED EKG   Patient Vitals for the past 24 hrs:  BP Temp Temp src Pulse Resp SpO2 Height Weight  06/26/23 2119 109/65 -- -- (!) 104 19 -- -- --  06/26/23 2056 120/77 98.5 F (36.9 C) Oral (!) 102 19 99 % 5\' 7"  (1.702 m) 70.2 kg    MDM - Normal EKG. Normal Sinus Rhythm.  -  Wet prep normal  - UA normal, low suspicion for Dehydration or UTI - White count normal, low suspicion for infection   - CMP normal for pregnancy.  - COVID negative  - VS stable while in MAU - Resp panel positive for Rhinovirus  - plan for discharge.   Assessment and Plan   1. Rhinovirus infection   2. [redacted] weeks gestation of pregnancy   3. Body aches    - Reviewed diagnosis of Rhinovirus and OTC options that are safe in pregnancy that may help with symptoms.  - Encouraged to increase fluids and rest as needed.  - Worsening signs and return precautions reviewed.  - Preterm labor precautions reviewed.  - Patient discharged home in stable condition and may return to MAU as needed.   Claudette Head MSN, CNM  06/26/2023, 9:35 PM

## 2023-06-27 DIAGNOSIS — R52 Pain, unspecified: Secondary | ICD-10-CM

## 2023-06-27 DIAGNOSIS — B348 Other viral infections of unspecified site: Secondary | ICD-10-CM | POA: Diagnosis not present

## 2023-06-27 DIAGNOSIS — Z3A14 14 weeks gestation of pregnancy: Secondary | ICD-10-CM | POA: Diagnosis not present

## 2023-06-27 LAB — RESPIRATORY PANEL BY PCR

## 2023-06-27 LAB — GC/CHLAMYDIA PROBE AMP (~~LOC~~) NOT AT ARMC
Chlamydia: NEGATIVE
Comment: NEGATIVE
Comment: NORMAL
Neisseria Gonorrhea: NEGATIVE

## 2023-07-10 ENCOUNTER — Ambulatory Visit: Payer: Medicaid Other | Admitting: Student

## 2023-07-10 ENCOUNTER — Other Ambulatory Visit (HOSPITAL_COMMUNITY): Admission: RE | Admit: 2023-07-10 | Payer: Medicaid Other | Source: Ambulatory Visit

## 2023-07-10 VITALS — BP 128/84 | HR 99 | Wt 157.4 lb

## 2023-07-10 DIAGNOSIS — O99891 Other specified diseases and conditions complicating pregnancy: Secondary | ICD-10-CM

## 2023-07-10 DIAGNOSIS — M549 Dorsalgia, unspecified: Secondary | ICD-10-CM

## 2023-07-10 DIAGNOSIS — Z3480 Encounter for supervision of other normal pregnancy, unspecified trimester: Secondary | ICD-10-CM | POA: Insufficient documentation

## 2023-07-10 DIAGNOSIS — Z3A16 16 weeks gestation of pregnancy: Secondary | ICD-10-CM

## 2023-07-10 NOTE — Progress Notes (Signed)
Pt presents for ROB visit. Pt c/o pain in the right side that radiates down the leg and back pain. Pt has concerns about cloudy urine and skin irritation

## 2023-07-12 LAB — AFP, SERUM, OPEN SPINA BIFIDA
AFP MoM: 1.24
AFP Value: 44.8 ng/mL
Gest. Age on Collection Date: 16 weeks
Maternal Age At EDD: 24.9 yr
OSBR Risk 1 IN: 10000
Test Results:: NEGATIVE
Weight: 157 [lb_av]

## 2023-07-12 LAB — CERVICOVAGINAL ANCILLARY ONLY
Bacterial Vaginitis (gardnerella): POSITIVE — AB
Candida Glabrata: NEGATIVE
Candida Vaginitis: NEGATIVE
Chlamydia: NEGATIVE
Comment: NEGATIVE
Comment: NEGATIVE
Comment: NEGATIVE
Comment: NEGATIVE
Comment: NEGATIVE
Comment: NORMAL
Neisseria Gonorrhea: NEGATIVE
Trichomonas: NEGATIVE

## 2023-07-14 NOTE — Progress Notes (Signed)
   PRENATAL VISIT NOTE  Subjective:  Andrea Archer is a 22 y.o. G3P1011 at [redacted]w[redacted]d being seen today for ongoing prenatal care.  She is currently monitored for the following issues for this low-risk pregnancy and has Sickle cell trait (HCC); Supervision of other normal pregnancy, antepartum; and Rubella non-immune status, antepartum on their problem list.  Patient reports backache and leg pain . Patient has also noticed some vaginal skin irritation. Contractions: Not present. Vag. Bleeding: None.  Movement: Present. Denies leaking of fluid.   The following portions of the patient's history were reviewed and updated as appropriate: allergies, current medications, past family history, past medical history, past social history, past surgical history and problem list.   Objective:   Vitals:   07/10/23 1549  BP: 128/84  Pulse: 99  Weight: 157 lb 6.4 oz (71.4 kg)    Fetal Status: Fetal Heart Rate (bpm): 156   Movement: Present     General:  Alert, oriented and cooperative. Patient is in no acute distress.  Skin: Skin is warm and dry. No rash noted.   Cardiovascular: Normal heart rate noted  Respiratory: Normal respiratory effort, no problems with respiration noted  Abdomen: Soft, gravid, appropriate for gestational age.  Pain/Pressure: Present     Pelvic: Cervical exam deferred        Extremities: Normal range of motion.  Edema: None  Mental Status: Normal mood and affect. Normal behavior. Normal judgment and thought content.   Assessment and Plan:  Pregnancy: G3P1011 at [redacted]w[redacted]d 1. Supervision of other normal pregnancy, antepartum - AFP, Serum, Open Spina Bifida - Cervicovaginal ancillary only( ) - Ambulatory referral to Physical Therapy  2. [redacted] weeks gestation of pregnancy - AFP, Serum, Open Spina Bifida  3. Back pain affecting pregnancy in second trimester - options discussed and patient elected for physical therapy - Ambulatory referral to Physical Therapy  Preterm labor  symptoms and general obstetric precautions including but not limited to vaginal bleeding, contractions, leaking of fluid and fetal movement were reviewed in detail with the patient. Please refer to After Visit Summary for other counseling recommendations.   Return in about 4 weeks (around 08/07/2023) for LOB, IN-PERSON.  Future Appointments  Date Time Provider Department Center  07/26/2023  9:30 AM [facility] NURSE [facility]  07/26/2023  9:45 AM [facility]  08/01/2023  3:30 PM Raelyn Mora, CNM [facility] None    Corlis Hove, NP

## 2023-07-16 ENCOUNTER — Telehealth: Payer: Self-pay

## 2023-07-16 ENCOUNTER — Other Ambulatory Visit: Payer: Self-pay

## 2023-07-16 DIAGNOSIS — B9689 Other specified bacterial agents as the cause of diseases classified elsewhere: Secondary | ICD-10-CM

## 2023-07-16 MED ORDER — METRONIDAZOLE 0.75 % VA GEL
1.0000 | Freq: Every day | VAGINAL | 5 refills | Status: DC
Start: 2023-07-16 — End: 2023-07-26

## 2023-07-16 NOTE — Telephone Encounter (Signed)
Returning patient's phone call, patient did not answer and unable to leave vm.

## 2023-07-22 ENCOUNTER — Other Ambulatory Visit: Payer: Self-pay

## 2023-07-26 ENCOUNTER — Ambulatory Visit: Payer: Medicaid Other | Admitting: *Deleted

## 2023-07-26 ENCOUNTER — Encounter: Payer: Self-pay | Admitting: *Deleted

## 2023-07-26 ENCOUNTER — Other Ambulatory Visit: Payer: Self-pay | Admitting: *Deleted

## 2023-07-26 ENCOUNTER — Ambulatory Visit: Payer: Medicaid Other | Attending: Obstetrics

## 2023-07-26 VITALS — BP 115/74 | HR 101

## 2023-07-26 DIAGNOSIS — D573 Sickle-cell trait: Secondary | ICD-10-CM | POA: Insufficient documentation

## 2023-07-26 DIAGNOSIS — Z3480 Encounter for supervision of other normal pregnancy, unspecified trimester: Secondary | ICD-10-CM | POA: Diagnosis not present

## 2023-07-26 DIAGNOSIS — Z148 Genetic carrier of other disease: Secondary | ICD-10-CM | POA: Insufficient documentation

## 2023-07-26 DIAGNOSIS — Z3A19 19 weeks gestation of pregnancy: Secondary | ICD-10-CM | POA: Insufficient documentation

## 2023-07-26 DIAGNOSIS — Z363 Encounter for antenatal screening for malformations: Secondary | ICD-10-CM | POA: Diagnosis not present

## 2023-07-26 DIAGNOSIS — O358XX0 Maternal care for other (suspected) fetal abnormality and damage, not applicable or unspecified: Secondary | ICD-10-CM | POA: Insufficient documentation

## 2023-07-26 DIAGNOSIS — O35EXX0 Maternal care for other (suspected) fetal abnormality and damage, fetal genitourinary anomalies, not applicable or unspecified: Secondary | ICD-10-CM

## 2023-07-26 DIAGNOSIS — O99019 Anemia complicating pregnancy, unspecified trimester: Secondary | ICD-10-CM | POA: Insufficient documentation

## 2023-07-29 ENCOUNTER — Encounter: Payer: Self-pay | Admitting: Obstetrics

## 2023-08-01 ENCOUNTER — Encounter: Payer: Self-pay | Admitting: Obstetrics and Gynecology

## 2023-08-01 ENCOUNTER — Ambulatory Visit (INDEPENDENT_AMBULATORY_CARE_PROVIDER_SITE_OTHER): Payer: Medicaid Other | Admitting: Obstetrics and Gynecology

## 2023-08-01 VITALS — BP 111/78 | HR 108 | Wt 162.2 lb

## 2023-08-01 DIAGNOSIS — Z3A19 19 weeks gestation of pregnancy: Secondary | ICD-10-CM

## 2023-08-01 DIAGNOSIS — Z3480 Encounter for supervision of other normal pregnancy, unspecified trimester: Secondary | ICD-10-CM

## 2023-08-01 DIAGNOSIS — N898 Other specified noninflammatory disorders of vagina: Secondary | ICD-10-CM

## 2023-08-01 NOTE — Patient Instructions (Signed)
Eating Plan for Pregnant Women While you are pregnant, your body requires additional nutrition to help support your growing baby. You also have a higher need for some vitamins and minerals, such as folic acid, calcium, iron, and vitamin D. Eating a healthy, well-balanced diet is very important for your health and your baby's health. Your need for extra calories varies over the course of your pregnancy. Pregnancy is divided into three trimesters, with each trimester lasting 3 months. For most women, it is recommended to consume: 150 extra calories a day during the first trimester. 300 extra calories a day during the second trimester. 300 extra calories a day during the third trimester. What are tips for following this plan? Cooking Practice good food safety and cleanliness. Wash your hands before you eat and after you prepare raw meat. Wash all fruits and vegetables well before peeling or eating. Taking these actions can help to prevent foodborne illnesses that can be very dangerous to your baby, such as listeriosis. Ask your health care provider for more information about listeriosis. Make sure that all meats, poultry, and eggs are cooked to food-safe temperatures or "well-done." Meal planning  Eat a variety of foods (especially fruits and vegetables) to get a full range of vitamins and minerals. Two or more servings of fish are recommended each week in order to get the most benefits from omega-3 fatty acids that are found in seafood. Choose fish that are lower in mercury, such as salmon and pollock. Limit your overall intake of foods that have "empty calories." These are foods that have little nutritional value, such as sweets, desserts, candies, and sugar-sweetened beverages. Drinks that contain caffeine are okay to drink, but it is better to avoid caffeine. Keep your total caffeine intake to less than 200 mg each day (which is 12 oz or 355 mL of coffee, tea, or soda) or the limit as told by your  health care provider. General information Do not try to lose weight or go on a diet during pregnancy. Take a prenatal vitamin to help meet your additional vitamin and mineral needs during pregnancy, specifically for folic acid, iron, calcium, and vitamin D. Remember to stay active. Ask your health care provider what types of exercise and activities are safe for you. What does 150 extra calories look like? Healthy options that provide 150 extra calories each day could be any of the following: 6-8 oz (170-227 g) plain low-fat yogurt with  cup (70 g) berries. 1 apple with 2 tsp (11 g) peanut butter. Cut-up vegetables with  cup (60 g) hummus. 8 fl oz (237 mL) low-fat chocolate milk. 1 stick of string cheese with 1 medium orange. 1 peanut butter and jelly sandwich that is made with one slice of whole-wheat bread and 1 tsp (5 g) of peanut butter. For 300 extra calories, you could eat two of these healthy options each day. What is a healthy amount of weight to gain? The right amount of weight gain for you is based on your BMI (body mass index) before you became pregnant. If your BMI was less than 18 (underweight), you should gain 28-40 lb (13-18 kg). If your BMI was 18-24.9 (normal), you should gain 25-35 lb (11-16 kg). If your BMI was 25-29.9 (overweight), you should gain 15-25 lb (7-11 kg). If your BMI was 30 or greater (obese), you should gain 11-20 lb (5-9 kg). What if I am having twins or multiples? Generally, if you are carrying twins or multiples: You may need to eat  300-600 extra calories a day. The recommended range for total weight gain is 25-54 lb (11-25 kg), depending on your BMI before pregnancy. Talk with your health care provider to find out about nutritional needs, weight gain, and exercise that is right for you. What foods should I eat?  Fruits All fruits. Eat a variety of colors and types of fruit. Remember to wash your fruits well before peeling or eating. Vegetables All  vegetables. Eat a variety of colors and types of vegetables. Remember to wash your vegetables well before peeling or eating. Grains All grains. Choose whole grains, such as whole-wheat bread, oatmeal, or brown rice. Meats and other protein foods Lean meats, including chicken, [place], and lean cuts of beef, veal, or pork. Fish that is higher in omega-3 fatty acids and lower in mercury, such as salmon, herring, mussels, trout, sardines, pollock, shrimp, crab, and lobster. Tofu. Tempeh. Beans. Eggs. Peanut butter and other nut butters. Dairy Pasteurized milk and milk alternatives, such as almond milk. Pasteurized yogurt and pasteurized cheese. Cottage cheese. Sour cream. Beverages Water. Juices that contain 100% fruit juice or vegetable juice. Caffeine-free teas and decaffeinated coffee. Fats and oils Fats and oils are okay to include in moderation. Sweets and desserts Sweets and desserts are okay to include in moderation. Seasoning and other foods All pasteurized condiments. The items listed above may not be a complete list of foods and beverages you can eat. Contact a dietitian for more information. What foods should I avoid? Fruits Raw (unpasteurized) fruit juices. Vegetables Unpasteurized vegetable juices. Meats and other protein foods Precooked or cured meat, such as bologna, hot dogs, sausages, or meat loaves. (If you must eat those meats, reheat them until they are steaming hot.) Refrigerated pate, meat spreads from a meat counter, or smoked seafood that is found in the refrigerated section of a store. Raw or undercooked meats, poultry, and eggs. Raw fish, such as sushi or sashimi. Fish that have high mercury content, such as tilefish, shark, swordfish, and king mackerel. Dairy Unpasteurized milk and any foods that have unpasteurized milk in them. Soft cheeses, such as feta, queso blanco, queso fresco, [place], [place], panela, and blue-veined cheeses (unless they are made with pasteurized  milk, which must be stated on the label). Beverages Alcohol. Sugar-sweetened beverages, such as sodas, teas, or energy drinks. Seasoning and other foods Homemade fermented foods and drinks, such as pickles, sauerkraut, or kombucha drinks. (Store-bought pasteurized versions of these are okay.) Salads that are made in a store or deli, such as ham salad, chicken salad, egg salad, tuna salad, and seafood salad. The items listed above may not be a complete list of foods and beverages you should avoid. Contact a dietitian for more information. Where to find more information To calculate the number of calories you need based on your height, weight, and activity level, you can use an online calculator such as: PayStrike.dk To calculate how much weight you should gain during pregnancy, you can use an online pregnancy weight gain calculator such as: http://www.harvey.com/ To learn more about eating fish during pregnancy, talk with your health care provider or visit: PumpkinSearch.com.ee Summary While you are pregnant, your body requires additional nutrition to help support your growing baby. Eat a variety of foods, especially fruits and vegetables, to get a full range of vitamins and minerals. Practice good food safety and cleanliness. Wash your hands before you eat and after you prepare raw meat. Wash all fruits and vegetables well before peeling or eating. Taking these actions can  help to prevent foodborne illnesses, such as listeriosis, that can be very dangerous to your baby. Do not eat raw meat or fish. Do not eat fish that have high mercury content, such as tilefish, shark, swordfish, and king mackerel. Do not eat raw (unpasteurized) dairy. Take a prenatal vitamin to help meet your additional vitamin and mineral needs during pregnancy, specifically for folic acid, iron, calcium, and vitamin D. This information is not intended to replace advice given to you by your health care provider. Make sure you  discuss any questions you have with your health care provider. Document Revised: 07/14/2020 Document Reviewed: 07/14/2020 Elsevier Patient Education  2024 [employer].

## 2023-08-01 NOTE — Progress Notes (Signed)
Pt presents for ROB visit. Pt reports milky discharge and irritation with urination. Pt also c/o lightlessness and sweating when standing too long

## 2023-08-02 ENCOUNTER — Other Ambulatory Visit (HOSPITAL_COMMUNITY)
Admission: RE | Admit: 2023-08-02 | Discharge: 2023-08-02 | Disposition: A | Discharge status: Home / Self Care | Payer: Medicaid Other | Source: Ambulatory Visit

## 2023-08-02 DIAGNOSIS — N898 Other specified noninflammatory disorders of vagina: Secondary | ICD-10-CM | POA: Diagnosis not present

## 2023-08-06 ENCOUNTER — Encounter: Payer: Self-pay | Admitting: Obstetrics and Gynecology

## 2023-08-06 NOTE — Progress Notes (Signed)
   LOW-RISK PREGNANCY OFFICE VISIT Patient name: Andrea Archer MRN [id number]  Date of birth: Oct 18, 2001 Chief Complaint:   Routine Prenatal Visit  History of Present Illness:   Andrea Archer is a 22 y.o. G52P1011 female at [redacted]w[redacted]d with an Estimated Date of Delivery: 12/20/23 being seen today for ongoing management of a low-risk pregnancy.  Today she reports vaginal irritation. Feels warm, itchy, burning with urination. Increased milky vaginal discharge. Contractions: Irritability. Vag. Bleeding: None.  Movement: Present. denies leaking of fluid. Review of Systems:   Pertinent items are noted in HPI Denies abnormal vaginal discharge w/ itching/odor/irritation, headaches, visual changes, shortness of breath, chest pain, abdominal pain, severe nausea/vomiting, or problems with urination or bowel movements unless otherwise stated above. Pertinent History Reviewed:  Reviewed past medical,surgical, social, obstetrical and family history.  Reviewed problem list, medications and allergies. Physical Assessment:   Vitals:   08/01/23 1558  BP: 111/78  Pulse: (!) 108  Weight: 162 lb 3.2 oz (73.6 kg)  Body mass index is 25.4 kg/m.        Physical Examination:   General appearance: Well appearing, and in no distress  Mental status: Alert, oriented to person, place, and time  Skin: Warm & dry  Cardiovascular: Normal heart rate noted  Respiratory: Normal respiratory effort, no distress  Abdomen: Soft, gravid, nontender  Pelvic: Cervical exam deferred         Extremities: Edema: None  Fetal Status: Fetal Heart Rate (bpm): 154 Fundal Height: 19 cm Movement: Present    No results found for this or any previous visit (from the past 24 hour(s)).  Assessment & Plan:  1) Low-risk pregnancy G3P1011 at [redacted]w[redacted]d with an Estimated Date of Delivery: 12/20/23   2) Supervision of other normal pregnancy, antepartum - Normal LROB visit - Took H2O birth class 07/23/2023 -- will bring signed certificate nv -  Explained if still good candidate at time of admission a H2O birth consent will be signed at the hospital  3) Vaginal discharge - Cervicovaginal ancillary only( [place])  4) Vaginal irritation - Wet Prep - Results pending   5) [redacted] weeks gestation of pregnancy     Meds: No orders of the defined types were placed in this encounter.  Labs/procedures today: wet prep  Plan:  Continue routine obstetrical care   Reviewed: Preterm labor symptoms and general obstetric precautions including but not limited to vaginal bleeding, contractions, leaking of fluid and fetal movement were reviewed in detail with the patient.  All questions were answered. Has home bp cuff. Check bp weekly, let us know if >140/90.   Follow-up: Return in about 4 weeks (around 08/29/2023) for Return OB visit.  No orders of the defined types were placed in this encounter.  Raelyn Mora MSN, CNM 08/01/2023 3:12 PM

## 2023-08-07 ENCOUNTER — Ambulatory Visit: Payer: Medicaid Other

## 2023-08-15 ENCOUNTER — Encounter: Payer: Self-pay | Admitting: Physical Therapy

## 2023-08-15 ENCOUNTER — Other Ambulatory Visit: Payer: Self-pay

## 2023-08-15 ENCOUNTER — Ambulatory Visit: Payer: Medicaid Other | Admitting: Physical Therapy

## 2023-08-15 ENCOUNTER — Ambulatory Visit: Payer: Medicaid Other | Attending: Student | Admitting: Physical Therapy

## 2023-08-15 DIAGNOSIS — M6281 Muscle weakness (generalized): Secondary | ICD-10-CM | POA: Insufficient documentation

## 2023-08-15 DIAGNOSIS — O99891 Other specified diseases and conditions complicating pregnancy: Secondary | ICD-10-CM | POA: Insufficient documentation

## 2023-08-15 DIAGNOSIS — M549 Dorsalgia, unspecified: Secondary | ICD-10-CM | POA: Diagnosis not present

## 2023-08-15 DIAGNOSIS — Z3480 Encounter for supervision of other normal pregnancy, unspecified trimester: Secondary | ICD-10-CM | POA: Insufficient documentation

## 2023-08-15 DIAGNOSIS — M5442 Lumbago with sciatica, left side: Secondary | ICD-10-CM | POA: Insufficient documentation

## 2023-08-15 DIAGNOSIS — M5459 Other low back pain: Secondary | ICD-10-CM | POA: Diagnosis present

## 2023-08-15 DIAGNOSIS — Z3A19 19 weeks gestation of pregnancy: Secondary | ICD-10-CM | POA: Insufficient documentation
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
## 2023-08-20 ENCOUNTER — Ambulatory Visit: Payer: Medicaid Other | Admitting: Physical Therapy

## 2023-08-20 DIAGNOSIS — M5459 Other low back pain: Secondary | ICD-10-CM

## 2023-08-20 DIAGNOSIS — M6281 Muscle weakness (generalized): Secondary | ICD-10-CM

## 2023-08-20 DIAGNOSIS — M5442 Lumbago with sciatica, left side: Secondary | ICD-10-CM

## 2023-08-20 DIAGNOSIS — O99891 Other specified diseases and conditions complicating pregnancy: Secondary | ICD-10-CM | POA: Diagnosis not present
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
## 2023-08-22 ENCOUNTER — Telehealth: Payer: Self-pay

## 2023-08-22 NOTE — Telephone Encounter (Signed)
Pt called wanting to know if Valtrex was safe to take during pregnancy. Advised pt Valtrex is safe to take.

## 2023-08-26 ENCOUNTER — Ambulatory Visit: Payer: Medicaid Other | Admitting: Physical Therapy

## 2023-08-29 ENCOUNTER — Ambulatory Visit (INDEPENDENT_AMBULATORY_CARE_PROVIDER_SITE_OTHER): Payer: Medicaid Other | Admitting: Obstetrics and Gynecology

## 2023-08-29 VITALS — BP 113/82 | HR 97 | Wt 172.4 lb

## 2023-08-29 DIAGNOSIS — Z3480 Encounter for supervision of other normal pregnancy, unspecified trimester: Secondary | ICD-10-CM

## 2023-08-29 DIAGNOSIS — Z3A23 23 weeks gestation of pregnancy: Secondary | ICD-10-CM

## 2023-08-29 DIAGNOSIS — Z8619 Personal history of other infectious and parasitic diseases: Secondary | ICD-10-CM

## 2023-08-29 NOTE — Progress Notes (Signed)
   PRENATAL VISIT NOTE  Subjective:  Andrea Archer is a 22 y.o. G3P1011 at [redacted]w[redacted]d being seen today for ongoing prenatal care.  She is currently monitored for the following issues for this low-risk pregnancy and has Sickle cell trait (HCC); Supervision of other normal pregnancy, antepartum; and Rubella non-immune status, antepartum on their problem list.  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 Was treated. Thought she was having an outbreak and started the valtrex again. Was told she would have start suppression therapy in third trimester to prevent outbreak. Does not want to have a c/s. Reports occasional dizzy spells, reports well hydrated, and eating often. Denies vision changes, syncopal episodes   Contractions: Irritability. Vag. Bleeding: None.  Movement: Present. Denies leaking of fluid.   The following portions of the patient's history were reviewed and updated as appropriate: allergies, current medications, past family history, past medical history, past social history, past surgical history and problem list.   Objective:   Vitals:   08/29/23 1438  BP: 113/82  Pulse: 97  Weight: 172 lb 6.4 oz (78.2 kg)    Fetal Status: Fetal Heart Rate (bpm): 143   Movement: Present     General:  Alert, oriented and cooperative. Patient is in no acute distress.  Skin: Skin is warm and dry. No rash noted.   Cardiovascular: Normal heart rate noted  Respiratory: Normal respiratory effort, no problems with respiration noted  Abdomen: Soft, gravid, appropriate for gestational age.  Pain/Pressure: Present     Pelvic: Cervical exam deferred      No lesion detected on external or internal genitalia  Extremities: Normal range of motion.  Edema: Trace  Mental Status: Normal mood and affect. Normal behavior. Normal judgment and thought content.  Chaperone present for exam  Assessment and Plan:  Pregnancy: G3P1011 at [redacted]w[redacted]d 1. Supervision of other normal pregnancy,  antepartum BP and FHR normal  Feeling regular fetal movement   2. [redacted] weeks gestation of pregnancy Anticipatory guidance regarding GTT and labs next visit Would like to check blood work today, for dizzy spells- Discussed precautions to follow up. Continue hydration, slow changes in position  Has taken the water birth class, instructed on how to send certificate through my chart  3. 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 Please refer to After Visit Summary for other counseling recommendations.   Return in 4 weeks for routine prenatal and 2hrGTT  Albertine Grates, FNP

## 2023-08-29 NOTE — Progress Notes (Signed)
Pt. Presents for ROB, complains of dizzy spells.

## 2023-08-30 LAB — CBC
Hematocrit: 40 % (ref 34.0–46.6)
Hemoglobin: 12.9 g/dL (ref 11.1–15.9)
MCH: 28.5 pg (ref 26.6–33.0)
MCHC: 32.3 g/dL (ref 31.5–35.7)
MCV: 88 fL (ref 79–97)
Platelets: 217 10*3/uL (ref 150–450)
RBC: 4.53 x10E6/uL (ref 3.77–5.28)
RDW: 13.3 % (ref 11.7–15.4)
WBC: 12.3 10*3/uL — ABNORMAL HIGH (ref 3.4–10.8)

## 2023-08-31 ENCOUNTER — Encounter (HOSPITAL_BASED_OUTPATIENT_CLINIC_OR_DEPARTMENT_OTHER): Payer: Self-pay | Admitting: Emergency Medicine

## 2023-08-31 ENCOUNTER — Other Ambulatory Visit: Payer: Self-pay

## 2023-08-31 ENCOUNTER — Emergency Department (HOSPITAL_BASED_OUTPATIENT_CLINIC_OR_DEPARTMENT_OTHER)
Admission: EM | Admit: 2023-08-31 | Discharge: 2023-08-31 | Disposition: A | Discharge status: Home / Self Care | Payer: Medicaid Other | Attending: Emergency Medicine | Admitting: Emergency Medicine

## 2023-08-31 DIAGNOSIS — Z3A24 24 weeks gestation of pregnancy: Secondary | ICD-10-CM | POA: Diagnosis not present

## 2023-08-31 DIAGNOSIS — O26892 Other specified pregnancy related conditions, second trimester: Secondary | ICD-10-CM | POA: Insufficient documentation

## 2023-08-31 DIAGNOSIS — H169 Unspecified keratitis: Secondary | ICD-10-CM | POA: Diagnosis not present

## 2023-08-31 MED ORDER — NEOMYCIN-POLYMYXIN-DEXAMETH 3.5-10000-0.1 OP SUSP: 1.0000 [drp] | Freq: Four times a day (QID) | OPHTHALMIC | 0 refills | Status: DC

## 2023-08-31 MED ORDER — TETRACAINE HCL 0.5 % OP SOLN
2.0000 [drp] | Freq: Once | OPHTHALMIC | Status: DC
  Filled 2023-08-31: qty 4

## 2023-08-31 MED ORDER — FLUORESCEIN SODIUM 1 MG OP STRP
1.0000 | ORAL_STRIP | Freq: Once | OPHTHALMIC | Status: DC
  Filled 2023-08-31: qty 1

## 2023-08-31 NOTE — Discharge Instructions (Signed)
I spoke with the ophthalmologist tonight and they recommend that you use the antibiotic drops prescribed and follow-up with them on Tuesday.  Please call their office on Tuesday morning and tell them you were seen in the emergency department.  Return sooner with uncontrolled pain, vomiting, fever, vision loss or other problems.

## 2023-08-31 NOTE — ED Notes (Signed)
Dc instructions reviewed with patient. Patient voiced understanding. Dc with belongings. Eye patch provided

## 2023-08-31 NOTE — ED Provider Notes (Signed)
EMERGENCY DEPARTMENT AT [facility] Provider Note   CSN: [id number] Arrival date & time: 08/31/23  1724     History  No chief complaint on file.   Andrea Archer is a 22 y.o. female.  Patient presents to the emergency department for evaluation of right eye pain.  No ocular surgical history.  Patient occasionally will wear colored contacts but no use recently.  Patient is [redacted] weeks pregnant.  She reports a foreign body sensation that started last night.  She denies injuries or foreign bodies into the eye that she was aware of.  This then progressed overnight to more severe pain in the right eye with associated photophobia.  No swelling or redness around the eye.  No medications not helping.  No vision loss but reports that is difficult for her to open her eye due to pain and light sensitivity.  No vomiting or fevers.  No recent URI symptoms.       Home Medications Prior to Admission medications   Medication Sig Start Date End Date Taking? Authorizing Provider  neomycin-polymyxin b-dexamethasone (MAXITROL) 3.5-10000-0.1 SUSP Place 1 drop into the right eye every 6 (six) hours. 08/31/23  Yes Renne Crigler, PA-C  amoxicillin-clavulanate (AUGMENTIN) 875-125 MG tablet Take 1 tablet by mouth 2 (two) times daily. Patient not taking: Reported on 06/24/2023 06/10/23   Brock Bad, MD  Blood Pressure Monitoring (BLOOD PRESSURE KIT) DEVI 1 kit by Does not apply route once a week. 05/10/23   Warden Fillers, MD  cephALEXin (KEFLEX) 500 MG capsule Take 1 capsule (500 mg total) by mouth 4 (four) times daily. Patient not taking: Reported on 06/24/2023 06/11/23   Constant, Peggy, MD  Prenat-Fe Poly-Methfol-FA-DHA (VITAFOL ULTRA) 29-0.6-0.4-200 MG CAPS Take 1 capsule by mouth daily. Patient not taking: Reported on 06/24/2023 05/02/23   Warden Fillers, MD  Prenatal Vit-Fe Phos-FA-Omega (VITAFOL GUMMIES) 3.33-0.333-34.8 MG CHEW Chew 1 tablet by mouth daily. 06/14/23   Brock Bad,  MD  valACYclovir (VALTREX) 1000 MG tablet Take 1 tablet (1,000 mg total) by mouth 2 (two) times daily. Take 1 tablet twice a day for 10 days. 03/18/23   Constant, Peggy, MD      Allergies    Other and Pineapple    Review of Systems   Review of Systems  Physical Exam Updated Vital Signs BP 125/81 (BP Location: Right Arm)   Pulse 93   Temp 98.4 F (36.9 C) (Oral)   Resp 18   Wt 78.9 kg   LMP 03/15/2023 (Exact Date)   SpO2 100%   BMI 27.25 kg/m  Physical Exam Vitals and nursing note reviewed.  Constitutional:      Appearance: She is well-developed.  HENT:     Head: Normocephalic and atraumatic.  Eyes:     General: Lids are normal.     Intraocular pressure: Right eye pressure is 9 mmHg.     Extraocular Movements: Extraocular movements intact.     Conjunctiva/sclera:     Right eye: Right conjunctiva is injected. No chemosis.    Left eye: Left conjunctiva is not injected. No chemosis.    Pupils: Pupils are equal, round, and reactive to light. Pupils are equal.     Right eye: Pupil is round, reactive and not sluggish. Fluorescein uptake present. No corneal abrasion.     Slit lamp exam:    Right eye: No foreign body.     Comments: Punctate area of fluorescein uptake at approximately 7 o'clock position on the cornea  of the right eye.  Patient estimates 10% improvement in pain with tetracaine drops, but she does appear more comfortable and is able to open her eye more readily and cooperate with visual acuity.  Pain in right eye is made worse with light shown into the left eye.  On slit lamp exam, no foreign bodies, appears to be focal area of cloudiness at the area of the fluorescein uptake involving the cornea.  Normal eye pressure in right eye with tonometer  Pulmonary:     Effort: No respiratory distress.  Musculoskeletal:     Cervical back: Normal range of motion and neck supple.  Skin:    General: Skin is warm and dry.  Neurological:     Mental Status: She is alert.      ED Results / Procedures / Treatments   Labs (all labs ordered are listed, but only abnormal results are displayed) Labs Reviewed - No data to display  EKG None  Radiology No results found.  Procedures Procedures    Medications Ordered in ED Medications  tetracaine (PONTOCAINE) 0.5 % ophthalmic solution 2 drop (has no administration in time range)  fluorescein ophthalmic strip 1 strip (has no administration in time range)    ED Course/ Medical Decision Making/ A&P    Patient seen and examined. History obtained directly from patient.   Labs/EKG: None ordered  Imaging: None ordered  Medications/Fluids: Tetracaine and fluorescein strips  Most recent vital signs reviewed and are as follows: BP 125/81 (BP Location: Right Arm)   Pulse 93   Temp 98.4 F (36.9 C) (Oral)   Resp 18   Wt 78.9 kg   LMP 03/15/2023 (Exact Date)   SpO2 100%   BMI 27.25 kg/m   Initial impression: Focal area of keratitis  6:58 PM Reassessment performed. Patient appears stable.  I discussed patient with on-call ophthalmologist, Dr. Genia Del, who recommends prescription for Maxitrol drops every 6 hours.  They will follow-up with patient on Tuesday morning in office.  Plan: Discharge to home.   Prescriptions written for: Maxitrol drops  Other home care instructions discussed: Tylenol for pain, stay away from bright light  ED return instructions discussed: Fever, swelling about the eye, vision change or loss, fever  Follow-up instructions discussed: Patient encouraged to follow-up with ophthalmology on Tuesday as instructed. (Today is Saturday)                                   Medical Decision Making Risk Prescription drug management.   Patient with eye pain and photosensitivity, no foreign bodies noted.  Small area of keratitis suspected on slit-lamp exam.  No surrounding erythema, swelling, vision changes/loss suspicious for orbital or periorbital cellulitis. No signs of  glaucoma, intraocular pressures normal. No symptoms of retinal detachment. No ophthalmologic emergency suspected.  Discussed with on-call ophthalmology regarding treatment and follow-up over the holiday weekend.        Final Clinical Impression(s) / ED Diagnoses Final diagnoses:  Keratitis    Rx / DC Orders ED Discharge Orders          Ordered    neomycin-polymyxin b-dexamethasone (MAXITROL) 3.5-10000-0.1 SUSP  Every 6 hours        08/31/23 1852              Renne Crigler, PA-C 08/31/23 1903    Virgina Norfolk, DO 08/31/23 1933

## 2023-08-31 NOTE — ED Notes (Signed)
Dc instructions reviewed with patient. Patient voiced understanding. Dc with belongings.  °

## 2023-08-31 NOTE — ED Triage Notes (Signed)
Yesterday started having the sensation of something in her R eye. When she woke up today, the R eye was more irritated and painful and throughout the day has started experiencing swelling and pain in tissue around the eye.    Pt is [redacted] weeks pregnant.

## 2023-09-03 ENCOUNTER — Encounter: Payer: Self-pay | Admitting: Obstetrics and Gynecology

## 2023-09-03 ENCOUNTER — Telehealth: Payer: Self-pay | Admitting: Rehabilitative and Restorative Service Providers"

## 2023-09-03 ENCOUNTER — Ambulatory Visit: Payer: Medicaid Other | Attending: Student | Admitting: Rehabilitative and Restorative Service Providers"

## 2023-09-03 DIAGNOSIS — M6281 Muscle weakness (generalized): Secondary | ICD-10-CM | POA: Insufficient documentation

## 2023-09-03 DIAGNOSIS — M5459 Other low back pain: Secondary | ICD-10-CM | POA: Insufficient documentation

## 2023-09-03 NOTE — Telephone Encounter (Signed)
Called pt secondary to missed PT appointment.  She stated that she thought that her mother was going to cancel the appointment.  Pt reports that she was in the ED over the weekend for an eye infection and while she is feeling better, she wanted to take additional time to recover before coming to PT.  Reviewed attendance policy to patient and she verbalized understanding.  Confirmed next scheduled appointment and pt stated that she had the time written down.

## 2023-09-05 ENCOUNTER — Inpatient Hospital Stay (HOSPITAL_BASED_OUTPATIENT_CLINIC_OR_DEPARTMENT_OTHER)
Admission: EM | Admit: 2023-09-05 | Discharge: 2023-09-08 | DRG: 832 | Disposition: A | Discharge status: Home / Self Care | Payer: Medicaid Other | Attending: Obstetrics & Gynecology | Admitting: Obstetrics & Gynecology

## 2023-09-05 ENCOUNTER — Encounter (HOSPITAL_BASED_OUTPATIENT_CLINIC_OR_DEPARTMENT_OTHER): Payer: Self-pay | Admitting: Pediatrics

## 2023-09-05 ENCOUNTER — Other Ambulatory Visit: Payer: Self-pay

## 2023-09-05 DIAGNOSIS — O99012 Anemia complicating pregnancy, second trimester: Secondary | ICD-10-CM | POA: Diagnosis present

## 2023-09-05 DIAGNOSIS — O2302 Infections of kidney in pregnancy, second trimester: Principal | ICD-10-CM

## 2023-09-05 DIAGNOSIS — Z87891 Personal history of nicotine dependence: Secondary | ICD-10-CM

## 2023-09-05 DIAGNOSIS — D573 Sickle-cell trait: Secondary | ICD-10-CM | POA: Diagnosis not present

## 2023-09-05 DIAGNOSIS — N3000 Acute cystitis without hematuria: Secondary | ICD-10-CM | POA: Diagnosis not present

## 2023-09-05 DIAGNOSIS — Z3A24 24 weeks gestation of pregnancy: Secondary | ICD-10-CM

## 2023-09-05 DIAGNOSIS — R1030 Lower abdominal pain, unspecified: Secondary | ICD-10-CM | POA: Diagnosis present

## 2023-09-05 DIAGNOSIS — A6000 Herpesviral infection of urogenital system, unspecified: Secondary | ICD-10-CM | POA: Diagnosis not present

## 2023-09-05 DIAGNOSIS — M62830 Muscle spasm of back: Secondary | ICD-10-CM | POA: Diagnosis not present

## 2023-09-05 DIAGNOSIS — O2300 Infections of kidney in pregnancy, unspecified trimester: Secondary | ICD-10-CM | POA: Diagnosis present

## 2023-09-05 DIAGNOSIS — Z8759 Personal history of other complications of pregnancy, childbirth and the puerperium: Secondary | ICD-10-CM

## 2023-09-05 DIAGNOSIS — O98312 Other infections with a predominantly sexual mode of transmission complicating pregnancy, second trimester: Secondary | ICD-10-CM | POA: Diagnosis not present

## 2023-09-05 DIAGNOSIS — O2312 Infections of bladder in pregnancy, second trimester: Secondary | ICD-10-CM | POA: Diagnosis present

## 2023-09-05 LAB — BASIC METABOLIC PANEL
Anion gap: 8 (ref 5–15)
BUN: 7 mg/dL (ref 6–20)
CO2: 21 mmol/L — ABNORMAL LOW (ref 22–32)
Calcium: 8.3 mg/dL — ABNORMAL LOW (ref 8.9–10.3)
Chloride: 106 mmol/L (ref 98–111)
Creatinine, Ser: 0.84 mg/dL (ref 0.44–1.00)
GFR, Estimated: >60 mL/min (ref >60)
Glucose, Bld: 48 mg/dL — ABNORMAL LOW (ref 70–99)
Potassium: 3.7 mmol/L (ref 3.5–5.1)
Sodium: 135 mmol/L (ref 135–145)

## 2023-09-05 LAB — CBC WITH DIFFERENTIAL/PLATELET
Abs Immature Granulocytes: 0.24 10*3/uL — ABNORMAL HIGH (ref 0.00–0.07)
Basophils Absolute: 0.1 10*3/uL (ref 0.0–0.1)
Basophils Relative: 1 %
Eosinophils Absolute: 0.5 10*3/uL (ref 0.0–0.5)
Eosinophils Relative: 4 %
HCT: 34.1 % — ABNORMAL LOW (ref 36.0–46.0)
Hemoglobin: 12 g/dL (ref 12.0–15.0)
Immature Granulocytes: 2 %
Lymphocytes Relative: 15 %
Lymphs Abs: 2.1 10*3/uL (ref 0.7–4.0)
MCH: 29.2 pg (ref 26.0–34.0)
MCHC: 35.2 g/dL (ref 30.0–36.0)
MCV: 83 fL (ref 80.0–100.0)
Monocytes Absolute: 1.2 10*3/uL — ABNORMAL HIGH (ref 0.1–1.0)
Monocytes Relative: 9 %
Neutro Abs: 9.4 10*3/uL — ABNORMAL HIGH (ref 1.7–7.7)
Neutrophils Relative %: 69 %
Platelets: 221 10*3/uL (ref 150–400)
RBC: 4.11 MIL/uL (ref 3.87–5.11)
RDW: 14.2 % (ref 11.5–15.5)
WBC: 13.5 10*3/uL — ABNORMAL HIGH (ref 4.0–10.5)
nRBC: 0 % (ref 0.0–0.2)

## 2023-09-05 LAB — URINALYSIS, W/ REFLEX TO CULTURE (INFECTION SUSPECTED)
Bilirubin Urine: NEGATIVE
Glucose, UA: NEGATIVE mg/dL
Hgb urine dipstick: NEGATIVE
Ketones, ur: NEGATIVE mg/dL
Nitrite: NEGATIVE
Protein, ur: NEGATIVE mg/dL
Specific Gravity, Urine: 1.015 (ref 1.005–1.030)
pH: 6 (ref 5.0–8.0)

## 2023-09-05 LAB — WET PREP, GENITAL
Clue Cells Wet Prep HPF POC: NONE SEEN
Sperm: NONE SEEN
Trich, Wet Prep: NONE SEEN
WBC, Wet Prep HPF POC: >=10 — AB (ref <10)
Yeast Wet Prep HPF POC: NONE SEEN

## 2023-09-05 LAB — TYPE AND SCREEN
ABO/RH(D): O POS
Antibody Screen: NEGATIVE

## 2023-09-05 LAB — GLUCOSE, CAPILLARY: Glucose-Capillary: 75 mg/dL (ref 70–99)

## 2023-09-05 MED ORDER — LACTATED RINGERS IV SOLN: INTRAVENOUS | Status: DC

## 2023-09-05 MED ORDER — ONDANSETRON 4 MG PO TBDP: 4.0000 mg | ORAL_TABLET | Freq: Four times a day (QID) | ORAL | Status: DC | PRN

## 2023-09-05 MED ORDER — LACTATED RINGERS IV BOLUS
1000.0000 mL | Freq: Once | INTRAVENOUS | Status: AC
  Administered 2023-09-05: 1000 mL via INTRAVENOUS

## 2023-09-05 MED ORDER — SODIUM CHLORIDE 0.9 % IV SOLN: 1.0000 g | Freq: Once | INTRAVENOUS | Status: DC

## 2023-09-05 MED ORDER — SODIUM CHLORIDE 0.9 % IV SOLN
2.0000 g | INTRAVENOUS | Status: DC
  Administered 2023-09-05 – 2023-09-07 (×3): 2 g via INTRAVENOUS
  Filled 2023-09-05 (×4): qty 20

## 2023-09-05 MED ORDER — CALCIUM CARBONATE ANTACID 500 MG PO CHEW: 2.0000 | CHEWABLE_TABLET | ORAL | Status: DC | PRN

## 2023-09-05 MED ORDER — ONDANSETRON HCL 4 MG/2ML IJ SOLN: 4.0000 mg | Freq: Four times a day (QID) | INTRAMUSCULAR | Status: DC | PRN

## 2023-09-05 MED ORDER — SODIUM CHLORIDE 0.9 % IV SOLN: INTRAVENOUS | Status: DC

## 2023-09-05 MED ORDER — SODIUM CHLORIDE 0.9 % IV SOLN: 2.0000 g | INTRAVENOUS | Status: DC

## 2023-09-05 MED ORDER — PRENATAL MULTIVITAMIN CH: 1.0000 | ORAL_TABLET | Freq: Every day | ORAL | Status: DC

## 2023-09-05 MED ORDER — DOCUSATE SODIUM 100 MG PO CAPS
100.0000 mg | ORAL_CAPSULE | Freq: Every day | ORAL | Status: DC
  Filled 2023-09-05: qty 1

## 2023-09-05 MED ORDER — ACETAMINOPHEN 325 MG PO TABS
650.0000 mg | ORAL_TABLET | ORAL | Status: DC | PRN
  Administered 2023-09-05: 650 mg via ORAL
  Filled 2023-09-05: qty 2

## 2023-09-05 MED ORDER — ZOLPIDEM TARTRATE 5 MG PO TABS: 5.0000 mg | ORAL_TABLET | Freq: Every evening | ORAL | Status: DC | PRN

## 2023-09-05 MED ORDER — LACTATED RINGERS IV BOLUS: 1000.0000 mL | Freq: Once | INTRAVENOUS | Status: DC

## 2023-09-05 NOTE — Progress Notes (Signed)
Spoke with Dr Despina Hidden about pt and informed that [facility] dopplered fhr but isnt able to obtain a tracing.  MD says to send pt to [facility] for assessment at this time.

## 2023-09-05 NOTE — MAU Note (Signed)
G3P1 at 24.6 week c/o back and lower abdominal on and off for the last week.  Reports that it is he same pain she had when she had a UTI.  It strted at pressure and in creased to pain.  Had CVA tenderness.    Reports good FM, no LOF and no VB.

## 2023-09-05 NOTE — H&P (Signed)
History     CSN: [id number]  Arrival date and time: 09/05/23 1634   None     Chief Complaint  Patient presents with   Contractions   Abdominal Pain   HPI Ms.Andrea Archer is a 22 y.o. female G3P1011 @ [redacted]w[redacted]d coming from [facility] ED with  urgency/frequency x 5 days, back pain/ flank, and lower abdominal pain.  She Had a UTI earlier in pregnancy and this feels similar. She over all feels unwell.  No fever or nausea or vomiting.  OB History     Gravida  3   Para  1   Term  1   Preterm  0   AB  1   Living  1      SAB  1   IAB  0   Ectopic  0   Multiple  0   Live Births  1           Past Medical History:  Diagnosis Date   Anemia    Anxiety    Bacterial vaginitis    Depression    Hypertension    with pregnancy   Pregnant    Sickle cell trait (HCC)    UTI (urinary tract infection)     Past Surgical History:  Procedure Laterality Date   FINGER SURGERY Left    Pinky Finger   FRACTURE SURGERY  01/09/2019   left pinky finger    Family History  Problem Relation Age of Onset   Cancer Mother        cervical ca 2023   Hypertension Mother    Hypertension Father    Heart disease Father    Sickle cell anemia Father    Sickle cell trait Father        has sickle cell   Diabetes Maternal Grandmother    Hypertension Maternal Grandmother    Heart disease Maternal Grandmother    Fibromyalgia Maternal Grandmother    Hypertension Maternal Grandfather    Pancreatic cancer Maternal Grandfather    Hypertension Paternal Grandmother    Hypertension Paternal Grandfather    Colon cancer Neg Hx    Stomach cancer Neg Hx    Esophageal cancer Neg Hx    Rectal cancer Neg Hx     Social History   Tobacco Use   Smoking status: Former    Types: Cigars    Passive exposure: Past   Smokeless tobacco: Never   Tobacco comments:    Quit 2022  Vaping Use   Vaping status: Never Used  Substance Use Topics   Alcohol use: Not Currently    Comment: occ, not since  confirmed pregnancy   Drug use: No    Allergies:  Allergies  Allergen Reactions   Other     Henna - rash   Pineapple Rash    Medications Prior to Admission  Medication Sig Dispense Refill Last Dose   Blood Pressure Monitoring (BLOOD PRESSURE KIT) DEVI 1 kit by Does not apply route once a week. 1 each 0    neomycin-polymyxin b-dexamethasone (MAXITROL) 3.5-10000-0.1 SUSP Place 1 drop into the right eye every 6 (six) hours. 5 mL 0    Prenatal Vit-Fe Phos-FA-Omega (VITAFOL GUMMIES) 3.33-0.333-34.8 MG CHEW Chew 1 tablet by mouth daily. 90 tablet 5    valACYclovir (VALTREX) 1000 MG tablet Take 1 tablet (1,000 mg total) by mouth 2 (two) times daily. Take 1 tablet twice a day for 10 days. 20 tablet 11    Results for orders placed or performed  during the hospital encounter of 09/05/23 (from the past 48 hour(s))  CBC with Differential/Platelet     Status: Abnormal   Collection Time: 09/05/23  4:55 PM  Result Value Ref Range   WBC 13.5 (H) 4.0 - 10.5 K/uL   RBC 4.11 3.87 - 5.11 MIL/uL   Hemoglobin 12.0 12.0 - 15.0 g/dL   HCT 13.0 (L) 86.5 - 78.4 %   MCV 83.0 80.0 - 100.0 fL   MCH 29.2 26.0 - 34.0 pg   MCHC 35.2 30.0 - 36.0 g/dL   RDW 69.6 29.5 - 28.4 %   Platelets 221 150 - 400 K/uL   nRBC 0.0 0.0 - 0.2 %   Neutrophils Relative % 69 %   Neutro Abs 9.4 (H) 1.7 - 7.7 K/uL   Lymphocytes Relative 15 %   Lymphs Abs 2.1 0.7 - 4.0 K/uL   Monocytes Relative 9 %   Monocytes Absolute 1.2 (H) 0.1 - 1.0 K/uL   Eosinophils Relative 4 %   Eosinophils Absolute 0.5 0.0 - 0.5 K/uL   Basophils Relative 1 %   Basophils Absolute 0.1 0.0 - 0.1 K/uL   Immature Granulocytes 2 %   Abs Immature Granulocytes 0.24 (H) 0.00 - 0.07 K/uL    Comment: Performed at [facility], [address] [place], [place] [address]  Basic metabolic panel     Status: Abnormal   Collection Time: 09/05/23  4:55 PM  Result Value Ref Range   Sodium 135 135 - 145 mmol/L   Potassium 3.7 3.5 - 5.1 mmol/L    Chloride 106 98 - 111 mmol/L   CO2 21 (L) 22 - 32 mmol/L   Glucose, Bld 48 (L) 70 - 99 mg/dL    Comment: Glucose reference range applies only to samples taken after fasting for at least 8 hours.   BUN 7 6 - 20 mg/dL   Creatinine, Ser 0.10 0.44 - 1.00 mg/dL   Calcium 8.3 (L) 8.9 - 10.3 mg/dL   GFR, Estimated >27 >25 mL/min    Comment: (NOTE) Calculated using the CKD-EPI Creatinine Equation (2021)    Anion gap 8 5 - 15    Comment: Performed at [facility], [address] [place], [place] [address]  Urinalysis, w/ Reflex to Culture (Infection Suspected) -Urine, Clean Catch     Status: Abnormal   Collection Time: 09/05/23  4:55 PM  Result Value Ref Range   Specimen Source URINE, CLEAN CATCH    Color, Urine YELLOW YELLOW   APPearance HAZY (A) CLEAR   Specific Gravity, Urine 1.015 1.005 - 1.030   pH 6.0 5.0 - 8.0   Glucose, UA NEGATIVE NEGATIVE mg/dL   Hgb urine dipstick NEGATIVE NEGATIVE   Bilirubin Urine NEGATIVE NEGATIVE   Ketones, ur NEGATIVE NEGATIVE mg/dL   Protein, ur NEGATIVE NEGATIVE mg/dL   Nitrite NEGATIVE NEGATIVE   Leukocytes,Ua MODERATE (A) NEGATIVE   RBC / HPF 0-5 0 - 5 RBC/hpf   WBC, UA 21-50 0 - 5 WBC/hpf    Comment:        Reflex urine culture not performed if WBC <=10, OR if Squamous epithelial cells >5. If Squamous epithelial cells >5 suggest recollection.    Bacteria, UA RARE (A) NONE SEEN   Squamous Epithelial / HPF 21-50 0 - 5 /HPF    Comment: Performed at [facility], [address], [place], [place] [address]    Review of Systems  Constitutional:  Negative for chills and fever.  Gastrointestinal:  Positive for  abdominal pain.  Genitourinary:  Positive for flank pain, frequency and urgency.  Musculoskeletal:  Positive for back pain.   Physical Exam   Blood pressure 118/68, pulse 83, temperature 98.3 F (36.8 C), temperature source Oral, resp. rate 17, weight 78.9 kg, last menstrual period 03/15/2023, SpO2  100%.  Physical Exam Vitals and nursing note reviewed.  Constitutional:      General: She is not in acute distress.    Appearance: She is well-developed. She is ill-appearing. She is not toxic-appearing or diaphoretic.  HENT:     Head: Normocephalic.  Abdominal:     Tenderness: There is generalized abdominal tenderness. There is right CVA tenderness and left CVA tenderness.     Comments: Rt CVA > Left CVA tenderness   Genitourinary:    Comments: Cervix closed, thick, posterior.  Exam by Venia Carbon, NP  Neurological:     Mental Status: She is alert.   Fetal Tracing: Baseline: 140 bpm Variability: Moderate  Accelerations: 15x15 Decelerations: None Toco:  None  Assessment and Plan   A:  1. Pyelonephritis affecting pregnancy in second trimester   2. Lower abdominal pain   3. Acute cystitis without hematuria   4. [redacted] weeks gestation of pregnancy       P:  Admit to [facility] Urine culture pending Rocephin ordered Tylenol for pain.  Duane Lope, NP 09/05/2023 7:37 PM

## 2023-09-05 NOTE — ED Provider Notes (Addendum)
[facility] AT [facility] Provider Note   CSN: [id number] Arrival date & time: 09/05/23  1634     History  Chief Complaint  Patient presents with   Contractions    Andrea Archer is a 22 y.o. female.  Patient is a 22 year old female who is a G2 P1 who is currently 24 weeks 6 days pregnant presenting today with complaints of lower pelvic pain, pressure and feelings of contractions.  Symptoms started about 5 days ago and is getting worse.  She reports when she urinates it then feels shortly after that she has to urinate again.  She has had no vaginal bleeding or discharge.  She has noticed her urine being cloudy but denies any vomiting, no fever.  With this pregnancy she has already had a urinary tract infection but no other significant complications with this pregnancy.  She is feeling baby move regularly.  The history is provided by the patient and medical records.       Home Medications Prior to Admission medications   Medication Sig Start Date End Date Taking? Authorizing Provider  Blood Pressure Monitoring (BLOOD PRESSURE KIT) DEVI 1 kit by Does not apply route once a week. 05/10/23   Warden Fillers, MD  neomycin-polymyxin b-dexamethasone (MAXITROL) 3.5-10000-0.1 SUSP Place 1 drop into the right eye every 6 (six) hours. 08/31/23   Renne Crigler, PA-C  Prenatal Vit-Fe Phos-FA-Omega (VITAFOL GUMMIES) 3.33-0.333-34.8 MG CHEW Chew 1 tablet by mouth daily. 06/14/23   Brock Bad, MD  valACYclovir (VALTREX) 1000 MG tablet Take 1 tablet (1,000 mg total) by mouth 2 (two) times daily. Take 1 tablet twice a day for 10 days. 03/18/23   Constant, Peggy, MD      Allergies    Other and Pineapple    Review of Systems   Review of Systems  Physical Exam Updated Vital Signs BP 121/69   Pulse 96   Temp (!) 97.4 F (36.3 C)   Resp 18   Wt 78.9 kg   LMP 03/15/2023 (Exact Date)   SpO2 100%   BMI 27.25 kg/m  Physical Exam Vitals and nursing note  reviewed.  Constitutional:      General: She is not in acute distress.    Appearance: She is well-developed.  HENT:     Head: Normocephalic and atraumatic.  Eyes:     Pupils: Pupils are equal, round, and reactive to light.  Cardiovascular:     Rate and Rhythm: Normal rate and regular rhythm.     Heart sounds: Normal heart sounds. No murmur heard.    No friction rub.  Pulmonary:     Effort: Pulmonary effort is normal.     Breath sounds: Normal breath sounds. No wheezing or rales.  Abdominal:     General: Bowel sounds are normal. There is no distension.     Palpations: Abdomen is soft.     Tenderness: There is abdominal tenderness in the suprapubic area. There is right CVA tenderness and left CVA tenderness. There is no guarding or rebound.     Comments: Gravid to above the umbilicus.  Mild tenderness with palpation in the prepubic region  Musculoskeletal:        General: No tenderness. Normal range of motion.     Comments: No edema  Skin:    General: Skin is warm and dry.     Findings: No rash.  Neurological:     Mental Status: She is alert and oriented to person, place, and time.  Cranial Nerves: No cranial nerve deficit.  Psychiatric:        Behavior: Behavior normal.     ED Results / Procedures / Treatments   Labs (all labs ordered are listed, but only abnormal results are displayed) Labs Reviewed  CBC WITH DIFFERENTIAL/PLATELET - Abnormal; Notable for the following components:      Result Value   WBC 13.5 (*)    HCT 34.1 (*)    Neutro Abs 9.4 (*)    Monocytes Absolute 1.2 (*)    Abs Immature Granulocytes 0.24 (*)    All other components within normal limits  URINALYSIS, W/ REFLEX TO CULTURE (INFECTION SUSPECTED) - Abnormal; Notable for the following components:   APPearance HAZY (*)    Leukocytes,Ua MODERATE (*)    Bacteria, UA RARE (*)    All other components within normal limits  BASIC METABOLIC PANEL    EKG None  Radiology No results  found.  Procedures Procedures    Medications Ordered in ED Medications  cefTRIAXone (ROCEPHIN) 1 g in sodium chloride 0.9 % 100 mL IVPB (has no administration in time range)  lactated ringers bolus 1,000 mL (1,000 mLs Intravenous New Bag/Given 09/05/23 1744)    ED Course/ Medical Decision Making/ A&P Clinical Course as of 09/05/23 1759  Thu Sep 05, 2023  1734 Urinalysis, w/ Reflex to Culture (Infection Suspected) -Urine, Clean Catch [PA]    Clinical Course User Index [PA] Kathleen Lime, MD                                 Medical Decision Making Amount and/or Complexity of Data Reviewed Labs: ordered. Decision-making details documented in ED Course.  Risk Decision regarding hospitalization.   Pt with multiple medical problems and comorbidities and presenting today with a complaint that caries a high risk for morbidity and mortality.  Here today with concern for UTI and pregnancy.  Also concern for possible premature contractions preterm labor.  Patient is well-appearing and no definitive tensing of the abdomen on exam.  This has been slowly building in the last 5 days.  Hand-held Doppler showed fetal heart rate of 145.  Unfortunately toco does not seem to be picking up well and it is difficult to discern if she is having any contractions.  Patient was given IV fluids, labs are pending.  Did discuss with Dr. Omelia Blackwater with OB/GYN at this time they have excepted patient over to [facility] will monitor her and follow-up on her lab work.   I independently interpreted patient's labs and UA today with findings concerning for UTI with moderate leukocytes 21-50 white cells with rare bacteria.  There is some contamination but culture was sent.  Patient given a dose of Rocephin given her symptoms.  CBC with mild leukocytosis of 13.      Final Clinical Impression(s) / ED Diagnoses Final diagnoses:  Lower abdominal pain  Second trimester pregnancy  Acute cystitis without hematuria    Rx /  DC Orders ED Discharge Orders     None         Gwyneth Sprout, MD 09/05/23 1731    Gwyneth Sprout, MD 09/05/23 1800

## 2023-09-05 NOTE — ED Notes (Signed)
Handheld doppler fetal HR found to be 145.

## 2023-09-05 NOTE — Progress Notes (Signed)
[facility] called and informed of pt at [facility] who is G3P1 at 24.[redacted]wks pregnant and complains of lower abdominal pain since Aug 31st.  She says currently she is having contraction type pain every five minutes.  Pt denies LOF or vaginal bleeding and reports positive fetal movement.  Monitors being applied.  [facility] to monitor remotely.

## 2023-09-05 NOTE — MAU Provider Note (Signed)
History     CSN: [id number]  Arrival date and time: 09/05/23 1634   None     Chief Complaint  Patient presents with   Contractions   Abdominal Pain   HPI Andrea Archer is a 22 y.o. female G3P1011 @ [redacted]w[redacted]d coming from [facility] ED with  urgency/frequency x 5 days, back pain/ flank, and lower abdominal pain.  She Had a UTI earlier in pregnancy and this feels similar. She over all feels unwell.  No fever or nausea or vomiting.  OB History     Gravida  3   Para  1   Term  1   Preterm  0   AB  1   Living  1      SAB  1   IAB  0   Ectopic  0   Multiple  0   Live Births  1           Past Medical History:  Diagnosis Date   Anemia    Anxiety    Bacterial vaginitis    Depression    Hypertension    with pregnancy   Pregnant    Sickle cell trait (HCC)    UTI (urinary tract infection)     Past Surgical History:  Procedure Laterality Date   FINGER SURGERY Left    Pinky Finger   FRACTURE SURGERY  01/09/2019   left pinky finger    Family History  Problem Relation Age of Onset   Cancer Mother        cervical ca 2023   Hypertension Mother    Hypertension Father    Heart disease Father    Sickle cell anemia Father    Sickle cell trait Father        has sickle cell   Diabetes Maternal Grandmother    Hypertension Maternal Grandmother    Heart disease Maternal Grandmother    Fibromyalgia Maternal Grandmother    Hypertension Maternal Grandfather    Pancreatic cancer Maternal Grandfather    Hypertension Paternal Grandmother    Hypertension Paternal Grandfather    Colon cancer Neg Hx    Stomach cancer Neg Hx    Esophageal cancer Neg Hx    Rectal cancer Neg Hx     Social History   Tobacco Use   Smoking status: Former    Types: Cigars    Passive exposure: Past   Smokeless tobacco: Never   Tobacco comments:    Quit 2022  Vaping Use   Vaping status: Never Used  Substance Use Topics   Alcohol use: Not Currently    Comment: occ, not since  confirmed pregnancy   Drug use: No    Allergies:  Allergies  Allergen Reactions   Other     Henna - rash   Pineapple Rash    Medications Prior to Admission  Medication Sig Dispense Refill Last Dose   Blood Pressure Monitoring (BLOOD PRESSURE KIT) DEVI 1 kit by Does not apply route once a week. 1 each 0    neomycin-polymyxin b-dexamethasone (MAXITROL) 3.5-10000-0.1 SUSP Place 1 drop into the right eye every 6 (six) hours. 5 mL 0    Prenatal Vit-Fe Phos-FA-Omega (VITAFOL GUMMIES) 3.33-0.333-34.8 MG CHEW Chew 1 tablet by mouth daily. 90 tablet 5    valACYclovir (VALTREX) 1000 MG tablet Take 1 tablet (1,000 mg total) by mouth 2 (two) times daily. Take 1 tablet twice a day for 10 days. 20 tablet 11    Results for orders placed or performed during  the hospital encounter of 09/05/23 (from the past 48 hour(s))  CBC with Differential/Platelet     Status: Abnormal   Collection Time: 09/05/23  4:55 PM  Result Value Ref Range   WBC 13.5 (H) 4.0 - 10.5 K/uL   RBC 4.11 3.87 - 5.11 MIL/uL   Hemoglobin 12.0 12.0 - 15.0 g/dL   HCT 16.1 (L) 09.6 - 04.5 %   MCV 83.0 80.0 - 100.0 fL   MCH 29.2 26.0 - 34.0 pg   MCHC 35.2 30.0 - 36.0 g/dL   RDW 40.9 81.1 - 91.4 %   Platelets 221 150 - 400 K/uL   nRBC 0.0 0.0 - 0.2 %   Neutrophils Relative % 69 %   Neutro Abs 9.4 (H) 1.7 - 7.7 K/uL   Lymphocytes Relative 15 %   Lymphs Abs 2.1 0.7 - 4.0 K/uL   Monocytes Relative 9 %   Monocytes Absolute 1.2 (H) 0.1 - 1.0 K/uL   Eosinophils Relative 4 %   Eosinophils Absolute 0.5 0.0 - 0.5 K/uL   Basophils Relative 1 %   Basophils Absolute 0.1 0.0 - 0.1 K/uL   Immature Granulocytes 2 %   Abs Immature Granulocytes 0.24 (H) 0.00 - 0.07 K/uL    Comment: Performed at [facility], [address], [place], [place] [address]  Basic metabolic panel     Status: Abnormal   Collection Time: 09/05/23  4:55 PM  Result Value Ref Range   Sodium 135 135 - 145 mmol/L   Potassium 3.7 3.5 - 5.1 mmol/L    Chloride 106 98 - 111 mmol/L   CO2 21 (L) 22 - 32 mmol/L   Glucose, Bld 48 (L) 70 - 99 mg/dL    Comment: Glucose reference range applies only to samples taken after fasting for at least 8 hours.   BUN 7 6 - 20 mg/dL   Creatinine, Ser 6.21 0.44 - 1.00 mg/dL   Calcium 8.3 (L) 8.9 - 10.3 mg/dL   GFR, Estimated >30 >86 mL/min    Comment: (NOTE) Calculated using the CKD-EPI Creatinine Equation (2021)    Anion gap 8 5 - 15    Comment: Performed at [facility], [address], [place], [place] [address]  Urinalysis, w/ Reflex to Culture (Infection Suspected) -Urine, Clean Catch     Status: Abnormal   Collection Time: 09/05/23  4:55 PM  Result Value Ref Range   Specimen Source URINE, CLEAN CATCH    Color, Urine YELLOW YELLOW   APPearance HAZY (A) CLEAR   Specific Gravity, Urine 1.015 1.005 - 1.030   pH 6.0 5.0 - 8.0   Glucose, UA NEGATIVE NEGATIVE mg/dL   Hgb urine dipstick NEGATIVE NEGATIVE   Bilirubin Urine NEGATIVE NEGATIVE   Ketones, ur NEGATIVE NEGATIVE mg/dL   Protein, ur NEGATIVE NEGATIVE mg/dL   Nitrite NEGATIVE NEGATIVE   Leukocytes,Ua MODERATE (A) NEGATIVE   RBC / HPF 0-5 0 - 5 RBC/hpf   WBC, UA 21-50 0 - 5 WBC/hpf    Comment:        Reflex urine culture not performed if WBC <=10, OR if Squamous epithelial cells >5. If Squamous epithelial cells >5 suggest recollection.    Bacteria, UA RARE (A) NONE SEEN   Squamous Epithelial / HPF 21-50 0 - 5 /HPF    Comment: Performed at [facility], [address], [place], [place] [address]    Review of Systems  Constitutional:  Negative for chills and fever.  Gastrointestinal:  Positive for abdominal  pain.  Genitourinary:  Positive for flank pain, frequency and urgency.  Musculoskeletal:  Positive for back pain.   Physical Exam   Blood pressure 118/68, pulse 83, temperature 98.3 F (36.8 C), temperature source Oral, resp. rate 17, weight 78.9 kg, last menstrual period 03/15/2023, SpO2  100%.  Physical Exam Vitals and nursing note reviewed.  Constitutional:      General: She is not in acute distress.    Appearance: She is well-developed. She is ill-appearing. She is not toxic-appearing or diaphoretic.  HENT:     Head: Normocephalic.  Abdominal:     Tenderness: There is generalized abdominal tenderness. There is right CVA tenderness and left CVA tenderness.     Comments: Rt CVA > Left CVA tenderness   Genitourinary:    Comments: Cervix closed, thick, posterior.  Exam by Venia Carbon, NP  Neurological:     Mental Status: She is alert.   Fetal Tracing: Baseline: 140 bpm Variability: Moderate  Accelerations: 15x15 Decelerations: None Toco:  None  MAU Course  Procedures  MDM  Urine culture ordered from MAU Rocephin 2 grams  Discussed patient with Dr. Alysia Penna who agrees with admission for the treatment of pyelonephritis.   Assessment and Plan   A:  1. Lower abdominal pain   2. Second trimester pregnancy   3. Acute cystitis without hematuria   4. Pyelonephritis affecting pregnancy in second trimester   5. [redacted] weeks gestation of pregnancy      P:  Admit to [facility] Urine culture pending Rocephin ordered Tylenol for pain.  Duane Lope, NP 09/05/2023 7:37 PM

## 2023-09-05 NOTE — ED Notes (Signed)
Kiana with cl called for transport 

## 2023-09-05 NOTE — ED Notes (Signed)
[facility] called  

## 2023-09-05 NOTE — ED Triage Notes (Signed)
C/O lower abdominal cramping that occurs about every 5 mins; denies painful urination but has been having increased urinary freq. States she is 24 w 6 days pregnant, G3P1.

## 2023-09-06 DIAGNOSIS — D573 Sickle-cell trait: Secondary | ICD-10-CM | POA: Diagnosis present

## 2023-09-06 DIAGNOSIS — R1030 Lower abdominal pain, unspecified: Secondary | ICD-10-CM | POA: Diagnosis present

## 2023-09-06 DIAGNOSIS — Z3A24 24 weeks gestation of pregnancy: Secondary | ICD-10-CM | POA: Diagnosis not present

## 2023-09-06 DIAGNOSIS — O2302 Infections of kidney in pregnancy, second trimester: Secondary | ICD-10-CM | POA: Diagnosis present

## 2023-09-06 DIAGNOSIS — Z3A25 25 weeks gestation of pregnancy: Secondary | ICD-10-CM

## 2023-09-06 DIAGNOSIS — M62830 Muscle spasm of back: Secondary | ICD-10-CM | POA: Diagnosis not present

## 2023-09-06 DIAGNOSIS — A6000 Herpesviral infection of urogenital system, unspecified: Secondary | ICD-10-CM | POA: Diagnosis present

## 2023-09-06 DIAGNOSIS — O99012 Anemia complicating pregnancy, second trimester: Secondary | ICD-10-CM | POA: Diagnosis present

## 2023-09-06 DIAGNOSIS — N3000 Acute cystitis without hematuria: Secondary | ICD-10-CM | POA: Diagnosis present

## 2023-09-06 DIAGNOSIS — O98312 Other infections with a predominantly sexual mode of transmission complicating pregnancy, second trimester: Secondary | ICD-10-CM | POA: Diagnosis present

## 2023-09-06 DIAGNOSIS — O2312 Infections of bladder in pregnancy, second trimester: Secondary | ICD-10-CM | POA: Diagnosis present

## 2023-09-06 DIAGNOSIS — Z87891 Personal history of nicotine dependence: Secondary | ICD-10-CM | POA: Diagnosis not present

## 2023-09-06 LAB — GC/CHLAMYDIA PROBE AMP (~~LOC~~) NOT AT ARMC
Chlamydia: NEGATIVE
Comment: NEGATIVE
Comment: NORMAL
Neisseria Gonorrhea: NEGATIVE

## 2023-09-06 LAB — CULTURE, OB URINE

## 2023-09-06 MED ORDER — OXYCODONE HCL 5 MG/5ML PO SOLN
5.0000 mg | ORAL | Status: DC | PRN
  Administered 2023-09-06 – 2023-09-07 (×5): 5 mg via ORAL
  Filled 2023-09-06 (×5): qty 5

## 2023-09-06 NOTE — Progress Notes (Signed)
Patient ID: Andrea Archer, female   DOB: 2001/04/29, 22 y.o.   MRN: [id number] ACULTY PRACTICE ANTEPARTUM COMPREHENSIVE PROGRESS NOTE  Andrea Archer is a 22 y.o. G3P1011 at [redacted]w[redacted]d  who is admitted for pyelonephritis.   Fetal presentation is unsure. Length of Stay:  0  Days  Subjective: Pt reports feeling so better this more. Less back pain Patient reports good fetal movement.  She reports no uterine contractions, no bleeding and no loss of fluid per vagina.  Vitals:  Blood pressure 113/66, pulse 75, temperature 98.3 F (36.8 C), temperature source Oral, resp. rate 18, height 5\' 6"  (1.676 m), weight 78.9 kg, last menstrual period 03/15/2023, SpO2 100%.  Physical Examination: Lungs clear Heart RRR Abd soft + BS gravid Left CVA tenderness Ext non tender  Fetal Monitoring:   140-150's  Labs:  Results for orders placed or performed during the hospital encounter of 09/05/23 (from the past 24 hour(s))  CBC with Differential/Platelet   Collection Time: 09/05/23  4:55 PM  Result Value Ref Range   WBC 13.5 (H) 4.0 - 10.5 K/uL   RBC 4.11 3.87 - 5.11 MIL/uL   Hemoglobin 12.0 12.0 - 15.0 g/dL   HCT 40.9 (L) 81.1 - 91.4 %   MCV 83.0 80.0 - 100.0 fL   MCH 29.2 26.0 - 34.0 pg   MCHC 35.2 30.0 - 36.0 g/dL   RDW 78.2 95.6 - 21.3 %   Platelets 221 150 - 400 K/uL   nRBC 0.0 0.0 - 0.2 %   Neutrophils Relative % 69 %   Neutro Abs 9.4 (H) 1.7 - 7.7 K/uL   Lymphocytes Relative 15 %   Lymphs Abs 2.1 0.7 - 4.0 K/uL   Monocytes Relative 9 %   Monocytes Absolute 1.2 (H) 0.1 - 1.0 K/uL   Eosinophils Relative 4 %   Eosinophils Absolute 0.5 0.0 - 0.5 K/uL   Basophils Relative 1 %   Basophils Absolute 0.1 0.0 - 0.1 K/uL   Immature Granulocytes 2 %   Abs Immature Granulocytes 0.24 (H) 0.00 - 0.07 K/uL  Basic metabolic panel   Collection Time: 09/05/23  4:55 PM  Result Value Ref Range   Sodium 135 135 - 145 mmol/L   Potassium 3.7 3.5 - 5.1 mmol/L   Chloride 106 98 - 111 mmol/L   CO2 21 (L) 22 - 32  mmol/L   Glucose, Bld 48 (L) 70 - 99 mg/dL   BUN 7 6 - 20 mg/dL   Creatinine, Ser 0.86 0.44 - 1.00 mg/dL   Calcium 8.3 (L) 8.9 - 10.3 mg/dL   GFR, Estimated >57 >84 mL/min   Anion gap 8 5 - 15  Urinalysis, w/ Reflex to Culture (Infection Suspected) -Urine, Clean Catch   Collection Time: 09/05/23  4:55 PM  Result Value Ref Range   Specimen Source URINE, CLEAN CATCH    Color, Urine YELLOW YELLOW   APPearance HAZY (A) CLEAR   Specific Gravity, Urine 1.015 1.005 - 1.030   pH 6.0 5.0 - 8.0   Glucose, UA NEGATIVE NEGATIVE mg/dL   Hgb urine dipstick NEGATIVE NEGATIVE   Bilirubin Urine NEGATIVE NEGATIVE   Ketones, ur NEGATIVE NEGATIVE mg/dL   Protein, ur NEGATIVE NEGATIVE mg/dL   Nitrite NEGATIVE NEGATIVE   Leukocytes,Ua MODERATE (A) NEGATIVE   RBC / HPF 0-5 0 - 5 RBC/hpf   WBC, UA 21-50 0 - 5 WBC/hpf   Bacteria, UA RARE (A) NONE SEEN   Squamous Epithelial / HPF 21-50 0 - 5 /HPF  Wet  prep, genital   Collection Time: 09/05/23  7:36 PM  Result Value Ref Range   Yeast Wet Prep HPF POC NONE SEEN NONE SEEN   Trich, Wet Prep NONE SEEN NONE SEEN   Clue Cells Wet Prep HPF POC NONE SEEN NONE SEEN   WBC, Wet Prep HPF POC >=10 (A) <10   Sperm NONE SEEN   Type and screen [facility]   Collection Time: 09/05/23  7:50 PM  Result Value Ref Range   ABO/RH(D) O POS    Antibody Screen NEG    Sample Expiration      09/08/2023,2359 Performed at [facility], [address], [place], [place] [address]   Glucose, capillary   Collection Time: 09/05/23  7:53 PM  Result Value Ref Range   Glucose-Capillary 75 70 - 99 mg/dL    Imaging Studies:    NA   Medications:  Scheduled  docusate sodium  100 mg Oral Daily   prenatal multivitamin  1 tablet Oral Q1200   I have reviewed the patient's current medications.  ASSESSMENT: IUP 25 0/7 weeks Pyelonephritis   PLAN: Stable. Continue with IV antibiotics Continue routine antenatal care.   Hermina Staggers 09/06/2023,7:35  AM

## 2023-09-07 MED ORDER — CYCLOBENZAPRINE HCL 10 MG PO TABS
10.0000 mg | ORAL_TABLET | Freq: Three times a day (TID) | ORAL | Status: DC | PRN
  Administered 2023-09-07: 10 mg via ORAL
  Filled 2023-09-07: qty 1

## 2023-09-07 MED ORDER — MUSCLE RUB 10-15 % EX CREA
TOPICAL_CREAM | CUTANEOUS | Status: DC | PRN
  Filled 2023-09-07: qty 85

## 2023-09-07 NOTE — Plan of Care (Signed)

## 2023-09-07 NOTE — Progress Notes (Signed)
Patient ID: Andrea Archer, female   DOB: 09-Sep-2001, 22 y.o.   MRN: [id number] FACULTY PRACTICE ANTEPARTUM(COMPREHENSIVE) NOTE  Andrea Archer is a 22 y.o. G3P1011 with Estimated Date of Delivery: 12/20/23   By  early ultrasound [redacted]w[redacted]d  who is admitted for pyelonephritis.    Fetal presentation is unsure. Length of Stay:  1  Days  Date of admission:09/05/2023  Subjective: Pt with lower right back pain, exam consistent with MS spasm of the right paraspinous muscles Patient reports the fetal movement as active. Patient reports uterine contraction  activity as none. Patient reports  vaginal bleeding as none. Patient describes fluid per vagina as None.  Vitals:  Blood pressure (!) 105/59, pulse 78, temperature 97.8 F (36.6 C), temperature source Oral, resp. rate 20, height 5\' 6"  (1.676 m), weight 78.9 kg, last menstrual period 03/15/2023, SpO2 100%. Vitals:   09/06/23 1957 09/07/23 0448 09/07/23 0807 09/07/23 0824  BP: 120/63 (!) 102/59 (!) 105/59   Pulse: 90 68 78   Resp: 16 16 20    Temp: 98 F (36.7 C) 98.6 F (37 C) 97.8 F (36.6 C)   TempSrc: Oral Oral Oral   SpO2: 98% 100% 100% 100%  Weight:      Height:       Physical Examination:  General appearance - alert, well appearing, and in no distress Abdomen - soft, nontender, nondistended, no masses or organomegaly Back exam - tender right paraspinous from thoracic to sacrum Fundal Height:  size equals dates Pelvic Exam:  examination not indicated Cervical Exam: Not evaluated.  Extremities: extremities normal, atraumatic, no cyanosis or edema with DTRs 2+ bilaterally Membranes:intact  Fetal Monitoring:  Baseline: 150 bpm, Variability: Good {> 6 bpm), Accelerations: Non-reactive but appropriate for gestational age, and Decelerations: Absent   appropriate for 25 weeks  Labs:  No results found for this or any previous visit (from the past 24 hour(s)).  Imaging Studies:    No results found.   Medications:  Scheduled  docusate sodium   100 mg Oral Daily   prenatal multivitamin  1 tablet Oral Q1200   I have reviewed the patient's current medications.  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

## 2023-09-08 DIAGNOSIS — O2302 Infections of kidney in pregnancy, second trimester: Secondary | ICD-10-CM | POA: Diagnosis not present

## 2023-09-08 DIAGNOSIS — Z3A25 25 weeks gestation of pregnancy: Secondary | ICD-10-CM | POA: Diagnosis not present

## 2023-09-08 LAB — CBC WITH DIFFERENTIAL/PLATELET
Abs Immature Granulocytes: 0.38 10*3/uL — ABNORMAL HIGH (ref 0.00–0.07)
Basophils Absolute: 0.1 10*3/uL (ref 0.0–0.1)
Basophils Relative: 1 %
Eosinophils Absolute: 0.6 10*3/uL — ABNORMAL HIGH (ref 0.0–0.5)
Eosinophils Relative: 4 %
HCT: 32.8 % — ABNORMAL LOW (ref 36.0–46.0)
Hemoglobin: 11.4 g/dL — ABNORMAL LOW (ref 12.0–15.0)
Immature Granulocytes: 3 %
Lymphocytes Relative: 19 %
Lymphs Abs: 2.6 10*3/uL (ref 0.7–4.0)
MCH: 28.6 pg (ref 26.0–34.0)
MCHC: 34.8 g/dL (ref 30.0–36.0)
MCV: 82.2 fL (ref 80.0–100.0)
Monocytes Absolute: 1.3 10*3/uL — ABNORMAL HIGH (ref 0.1–1.0)
Monocytes Relative: 9 %
Neutro Abs: 9 10*3/uL — ABNORMAL HIGH (ref 1.7–7.7)
Neutrophils Relative %: 64 %
Platelets: 210 10*3/uL (ref 150–400)
RBC: 3.99 MIL/uL (ref 3.87–5.11)
RDW: 13.9 % (ref 11.5–15.5)
WBC: 13.9 10*3/uL — ABNORMAL HIGH (ref 4.0–10.5)
nRBC: 0 % (ref 0.0–0.2)

## 2023-09-08 MED ORDER — CYCLOBENZAPRINE HCL 10 MG PO TABS: 10.0000 mg | ORAL_TABLET | Freq: Three times a day (TID) | ORAL | 0 refills | Status: DC | PRN

## 2023-09-08 MED ORDER — CEPHALEXIN 500 MG PO CAPS: 500.0000 mg | ORAL_CAPSULE | Freq: Four times a day (QID) | ORAL | 0 refills | Status: DC

## 2023-09-08 NOTE — Plan of Care (Signed)
  Problem: Education: Goal: Knowledge of General Education information will improve Description: Including pain rating scale, medication(s)/side effects and non-pharmacologic comfort measures 09/08/2023 0933 by Samuella Cota, RN Outcome: Completed/Met 09/08/2023 0933 by Samuella Cota, RN Outcome: Progressing   Problem: Health Behavior/Discharge Planning: Goal: Ability to manage health-related needs will improve 09/08/2023 0933 by Samuella Cota, RN Outcome: Completed/Met 09/08/2023 0933 by Samuella Cota, RN Outcome: Progressing   Problem: Clinical Measurements: Goal: Ability to maintain clinical measurements within normal limits will improve 09/08/2023 0933 by Samuella Cota, RN Outcome: Completed/Met 09/08/2023 0933 by Samuella Cota, RN Outcome: Progressing Goal: Will remain free from infection 09/08/2023 0933 by Samuella Cota, RN Outcome: Completed/Met 09/08/2023 0933 by Samuella Cota, RN Outcome: Progressing Goal: Diagnostic test results will improve 09/08/2023 0933 by Samuella Cota, RN Outcome: Completed/Met 09/08/2023 0933 by Samuella Cota, RN Outcome: Progressing Goal: Respiratory complications will improve 09/08/2023 0933 by Samuella Cota, RN Outcome: Completed/Met 09/08/2023 0933 by Samuella Cota, RN Outcome: Progressing Goal: Cardiovascular complication will be avoided 09/08/2023 0933 by Samuella Cota, RN Outcome: Completed/Met 09/08/2023 0933 by Samuella Cota, RN Outcome: Progressing   Problem: Activity: Goal: Risk for activity intolerance will decrease 09/08/2023 0933 by Samuella Cota, RN Outcome: Completed/Met 09/08/2023 0933 by Samuella Cota, RN Outcome: Progressing   Problem: Nutrition: Goal: Adequate nutrition will be maintained 09/08/2023 0933 by Samuella Cota, RN Outcome: Completed/Met 09/08/2023 0933 by Samuella Cota, RN Outcome: Progressing   Problem: Coping: Goal: Level of anxiety will decrease 09/08/2023 0933 by Samuella Cota, RN Outcome: Completed/Met 09/08/2023 0933 by Samuella Cota, RN Outcome: Progressing   Problem: Elimination: Goal: Will not experience complications related to bowel motility 09/08/2023 0933 by Samuella Cota, RN Outcome: Completed/Met 09/08/2023 0933 by Samuella Cota, RN Outcome: Progressing Goal: Will not experience complications related to urinary retention 09/08/2023 0933 by Samuella Cota, RN Outcome: Completed/Met 09/08/2023 0933 by Samuella Cota, RN Outcome: Progressing   Problem: Pain Managment: Goal: General experience of comfort will improve 09/08/2023 0933 by Samuella Cota, RN Outcome: Completed/Met 09/08/2023 0933 by Samuella Cota, RN Outcome: Progressing   Problem: Safety: Goal: Ability to remain free from injury will improve 09/08/2023 0933 by Samuella Cota, RN Outcome: Completed/Met 09/08/2023 0933 by Samuella Cota, RN Outcome: Progressing   Problem: Skin Integrity: Goal: Risk for impaired skin integrity will decrease 09/08/2023 0933 by Samuella Cota, RN Outcome: Completed/Met 09/08/2023 0933 by Samuella Cota, RN Outcome: Progressing   Problem: Education: Goal: Knowledge of disease or condition will improve 09/08/2023 0933 by Samuella Cota, RN Outcome: Completed/Met 09/08/2023 0933 by Samuella Cota, RN Outcome: Progressing Goal: Knowledge of the prescribed therapeutic regimen will improve 09/08/2023 0933 by Samuella Cota, RN Outcome: Completed/Met 09/08/2023 0933 by Samuella Cota, RN Outcome: Progressing Goal: Individualized Educational Video(s) 09/08/2023 0933 by Samuella Cota, RN Outcome: Completed/Met 09/08/2023 0933 by Samuella Cota, RN Outcome: Progressing   Problem: Clinical Measurements: Goal: Complications related to the disease process, condition or treatment will be avoided or minimized 09/08/2023 0933 by Samuella Cota, RN Outcome: Completed/Met 09/08/2023 0933 by Samuella Cota, RN Outcome: Progressing

## 2023-09-08 NOTE — Discharge Summary (Signed)
Physician Discharge Summary  Patient ID: Andrea Archer MRN: [id number] DOB/AGE: [birth year] 22 y.o.  Admit date: 09/05/2023 Discharge date: 09/08/2023  Admission Diagnoses: Pyelonephritis   Discharge Diagnoses:  Principal Problem:   Pyelonephritis affecting pregnancy Active Problems:   History of gestational hypertension Back spasm Round ligament pain  Discharged Condition: stable  Hospital Course:  Pt was admitted with presumptive diagnosis of pyelonephritis clinically but urine culture ultimately returned as mixed flora She did improve but she also was having paraspinous spasm and round ligament pain She did receive 3 doses of rocephin and was discharged on oral keflex 500 QID as well as flexeril prn, to be seen at [facility] in 1 week (She has a scheduled appt tomorrow that I told her to cancel and reschedule for 09/16/23)  Consults:   Significant Diagnostic Studies:   Treatments: antibiotics: ceftriaxone  Discharge Exam: Blood pressure (!) 104/59, pulse 99, temperature 98.1 F (36.7 C), temperature source Oral, resp. rate 16, height 5\' 6"  (1.676 m), weight 78.9 kg, last menstrual period 03/15/2023, SpO2 100%. General appearance: alert, cooperative, and no distress Back: +para spinous tenderness/triggers on the right GI: soft, non-tender; bowel sounds normal; no masses,  no organomegaly  Disposition: Discharge disposition: 01-Home or Self Care       Discharge Instructions     Diet - low sodium heart healthy   Complete by: As directed    Increase activity slowly   Complete by: As directed       Allergies as of 09/08/2023       Reactions   Other    Henna - rash   Pineapple Rash        Medication List     TAKE these medications    Blood Pressure Kit Devi 1 kit by Does not apply route once a week.   cephALEXin 500 MG capsule Commonly known as: Keflex Take 1 capsule (500 mg total) by mouth 4 (four) times daily.   cyclobenzaprine 10 MG tablet Commonly  known as: FLEXERIL Take 1 tablet (10 mg total) by mouth 3 (three) times daily as needed for muscle spasms.   neomycin-polymyxin b-dexamethasone 3.5-10000-0.1 Susp Commonly known as: MAXITROL Place 1 drop into the right eye every 6 (six) hours.   valACYclovir 1000 MG tablet Commonly known as: Valtrex Take 1 tablet (1,000 mg total) by mouth 2 (two) times daily. Take 1 tablet twice a day for 10 days.   Vitafol Gummies 3.33-0.333-34.8 MG Chew Chew 1 tablet by mouth daily.        Follow-up Information     [facility] for [facility] Follow up on 09/16/2023.   Specialty: Obstetrics and Gynecology Why: Prenatal visit Contact information: [address], [address] [place] [address] [phone]                Signed: Lazaro Arms 09/08/2023, 9:28 AM

## 2023-09-10 ENCOUNTER — Ambulatory Visit: Payer: Medicaid Other

## 2023-09-12 ENCOUNTER — Ambulatory Visit: Payer: Medicaid Other | Admitting: Physical Therapy

## 2023-09-12 DIAGNOSIS — M5459 Other low back pain: Secondary | ICD-10-CM

## 2023-09-12 DIAGNOSIS — M6281 Muscle weakness (generalized): Secondary | ICD-10-CM
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
## 2023-09-17 ENCOUNTER — Ambulatory Visit (INDEPENDENT_AMBULATORY_CARE_PROVIDER_SITE_OTHER): Payer: Medicaid Other

## 2023-09-17 ENCOUNTER — Other Ambulatory Visit (HOSPITAL_COMMUNITY)
Admission: RE | Admit: 2023-09-17 | Discharge: 2023-09-17 | Disposition: A | Discharge status: Home / Self Care | Payer: Medicaid Other | Source: Ambulatory Visit | Attending: Obstetrics and Gynecology | Admitting: Obstetrics and Gynecology

## 2023-09-17 VITALS — BP 130/83 | HR 85

## 2023-09-17 DIAGNOSIS — Z113 Encounter for screening for infections with a predominantly sexual mode of transmission: Secondary | ICD-10-CM | POA: Diagnosis present

## 2023-09-17 DIAGNOSIS — N898 Other specified noninflammatory disorders of vagina: Secondary | ICD-10-CM | POA: Insufficient documentation

## 2023-09-17 NOTE — Progress Notes (Signed)
Patient was assessed and managed by nursing staff during this encounter. I have reviewed the chart and agree with the documentation and plan. I have also made any necessary editorial changes.  Warden Fillers, MD 09/17/2023 2:37 PM

## 2023-09-17 NOTE — Progress Notes (Signed)
SUBJECTIVE:  22 y.o. female complains of vaginal discharge for 3 days. Denies abnormal vaginal bleeding or significant pelvic pain or fever. No UTI symptoms. Denies history of known exposure to STD.  Patient's last menstrual period was 03/15/2023 (exact date).  OBJECTIVE:  She appears well, afebrile. Urine dipstick: not done.  ASSESSMENT:  Vaginal Discharge  Screening for STD's   PLAN:  GC, chlamydia, trichomonas, BVAG, CVAG probe sent to lab. Treatment: To be determined once lab results are received ROV prn if symptoms persist or worsen.

## 2023-09-18 ENCOUNTER — Encounter: Payer: Self-pay | Admitting: Obstetrics

## 2023-09-19 ENCOUNTER — Ambulatory Visit: Payer: Medicaid Other | Admitting: Physical Therapy

## 2023-09-20 ENCOUNTER — Ambulatory Visit: Payer: Medicaid Other | Admitting: Rehabilitative and Restorative Service Providers"

## 2023-09-25 ENCOUNTER — Ambulatory Visit: Payer: Medicaid Other | Admitting: Physical Therapy

## 2023-09-26 ENCOUNTER — Ambulatory Visit (INDEPENDENT_AMBULATORY_CARE_PROVIDER_SITE_OTHER): Payer: Medicaid Other | Admitting: Obstetrics and Gynecology

## 2023-09-26 ENCOUNTER — Encounter: Payer: Self-pay | Admitting: Obstetrics and Gynecology

## 2023-09-26 ENCOUNTER — Other Ambulatory Visit: Payer: Medicaid Other

## 2023-09-26 VITALS — BP 119/75 | HR 91 | Wt 183.6 lb

## 2023-09-26 DIAGNOSIS — Z3A27 27 weeks gestation of pregnancy: Secondary | ICD-10-CM | POA: Diagnosis not present

## 2023-09-26 DIAGNOSIS — Z3480 Encounter for supervision of other normal pregnancy, unspecified trimester: Secondary | ICD-10-CM

## 2023-09-26 DIAGNOSIS — O2302 Infections of kidney in pregnancy, second trimester: Secondary | ICD-10-CM | POA: Diagnosis not present

## 2023-09-26 DIAGNOSIS — O99613 Diseases of the digestive system complicating pregnancy, third trimester: Secondary | ICD-10-CM

## 2023-09-26 DIAGNOSIS — Z23 Encounter for immunization: Secondary | ICD-10-CM | POA: Diagnosis not present

## 2023-09-26 DIAGNOSIS — O99612 Diseases of the digestive system complicating pregnancy, second trimester: Secondary | ICD-10-CM | POA: Diagnosis not present

## 2023-09-26 DIAGNOSIS — K5900 Constipation, unspecified: Secondary | ICD-10-CM

## 2023-09-26 MED ORDER — MAGNESIUM 200 MG PO TABS
400.0000 mg | ORAL_TABLET | Freq: Every day | ORAL | 1 refills | Status: DC
Start: 2023-09-26 — End: 2024-01-30

## 2023-09-26 NOTE — Progress Notes (Signed)
Pt presents for ROB visit. Pt kidney infection 09-08-23, wants TOC today. Requesting stool softener. No other concerns

## 2023-09-26 NOTE — Patient Instructions (Signed)
Fetal Movement Counts Pregnant people often feel their baby's first movements about halfway through the pregnancy. These movements may feel like flutters, rolls, or swishes at first. As the baby grows, pregnant people start noticing more kicks and jabs. Around week 28 of your pregnancy, most health care providers recommend counting how often the baby moves (fetal movement counts). Counting fetal movements is vital for high-risk pregnancies, aiming to prevent stillbirths, but counting benefits all pregnancies. What is a fetal movement count?  A fetal movement count is the number of times that you feel your baby move during a certain amount of time. This may also be called a fetal kick count. Pay attention to when your baby is most active. You may notice your baby's sleep and wake cycles. You may also notice things that make your baby move more. You should do a fetal movement count: When your baby is normally most active. At the same time each day. A good time to count movements is while resting after eating and drinking. How do I count fetal movements? Find a quiet, comfortable area. Sit or lie down on your left side. Write down the date, the start and stop times, and the number of movements that you felt between those two times. Take this information with you to your health care visits. Write down your start time when you feel the first movement. Count kicks, flutters, swishes, rolls, and jabs. You should feel at least 10 movements within a 2 hour period. You may stop counting after you have felt 10 movements, or if you have been counting for 2 hours. Write down the stop time. If you do not feel 10 movements in 2 hours, you should repeat the count after waiting a few hours. After two attempts without 10 movements, contact your provider for further instructions. Your provider may want to do additional tests to assess your baby's well-being. Contact a health care provider if: You feel fewer than 10  movements in 2 hours after two count attempts a few hours apart. Your baby is not moving as usual, or not at all. This information is not intended to replace advice given to you by your health care provider. Make sure you discuss any questions you have with your health care provider. Document Revised: 01/02/2023 Document Reviewed: 01/02/2023 Elsevier Patient Education  2024 [employer] Iron-Rich Diet  Iron is a mineral that helps your body produce hemoglobin. Hemoglobin is a protein in red blood cells that carries oxygen to your body's tissues. Eating too little iron may cause you to feel weak and tired, and it can increase your risk of infection. Iron is naturally found in many foods, and many foods have iron added to them (are iron-fortified). You may need to follow an iron-rich diet if you do not have enough iron in your body due to certain medical conditions. The amount of iron that you need each day depends on your age, your sex, and any medical conditions you have. Follow instructions from your health care provider or a dietitian about how much iron you should eat each day. What are tips for following this plan? Reading food labels Check food labels to see how many milligrams (mg) of iron are in each serving. Cooking Cook foods in pots and pans that are made from iron. Take these steps to make it easier for your body to absorb iron from certain foods: Soak beans overnight before cooking. Soak whole grains overnight and drain them before using. Ferment flours before baking,  such as by using yeast in bread dough. Meal planning When you eat foods that contain iron, you should eat them with foods that are high in vitamin C. These include oranges, peppers, tomatoes, potatoes, and mangoes. Vitamin C helps your body absorb iron. Certain foods and drinks prevent your body from absorbing iron properly. Avoid eating these foods in the same meal as iron-rich foods or with iron supplements. These  foods include: Coffee, black tea, and red wine. Milk, dairy products, and foods that are high in calcium. Beans and soybeans. Whole grains. General information Take iron supplements only as told by your health care provider. An overdose of iron can be life-threatening. If you were prescribed iron supplements, take them with orange juice or a vitamin C supplement. When you eat iron-fortified foods or take an iron supplement, you should also eat foods that naturally contain iron, such as meat, poultry, and fish. Eating naturally iron-rich foods helps your body absorb the iron that is added to other foods or contained in a supplement. Iron from animal sources is better absorbed than iron from plant sources. What foods should I eat? Fruits Prunes. Raisins. Eat fruits high in vitamin C, such as oranges, grapefruits, and strawberries, with iron-rich foods. Vegetables Spinach (cooked). Green peas. Broccoli. Fermented vegetables. Eat vegetables high in vitamin C, such as leafy greens, potatoes, bell peppers, and tomatoes, with iron-rich foods. Grains Iron-fortified breakfast cereal. Iron-fortified whole-wheat bread. Enriched rice. Sprouted grains. Meats and other proteins Beef liver. Beef. [place]. Chicken. Oysters. Shrimp. Tuna. Sardines. Chickpeas. Nuts. Tofu. Pumpkin seeds. Beverages Tomato juice. Fresh orange juice. Prune juice. Hibiscus tea. Iron-fortified instant breakfast shakes. Sweets and desserts Blackstrap molasses. Seasonings and condiments Tahini. Fermented soy sauce. Other foods Wheat germ. The items listed above may not be a complete list of recommended foods and beverages. Contact a dietitian for more information. What foods should I limit? These are foods that should be limited while eating iron-rich foods as they can reduce the absorption of iron in your body. Grains Whole grains. Bran cereal. Bran flour. Meats and other proteins Soybeans. Products made from soy protein.  Black beans. Lentils. Mung beans. Split peas. Dairy Milk. Cream. Cheese. Yogurt. Cottage cheese. Beverages Coffee. Black tea. Red wine. Sweets and desserts Cocoa. Chocolate. Ice cream. Seasonings and condiments Basil. Oregano. Large amounts of parsley. The items listed above may not be a complete list of foods and beverages you should limit. Contact a dietitian for more information. Summary Iron is a mineral that helps your body produce hemoglobin. Hemoglobin is a protein in red blood cells that carries oxygen to your body's tissues. Iron is naturally found in many foods, and many foods have iron added to them (are iron-fortified). When you eat foods that contain iron, you should eat them with foods that are high in vitamin C. Vitamin C helps your body absorb iron. Certain foods and drinks prevent your body from absorbing iron properly, such as whole grains and dairy products. You should avoid eating these foods in the same meal as iron-rich foods or with iron supplements. This information is not intended to replace advice given to you by your health care provider. Make sure you discuss any questions you have with your health care provider. Document Revised: 11/28/2020 Document Reviewed: 11/28/2020 Elsevier Patient Education  2024 [employer].

## 2023-09-27 ENCOUNTER — Ambulatory Visit: Payer: Medicaid Other | Admitting: *Deleted

## 2023-09-27 ENCOUNTER — Ambulatory Visit: Payer: Medicaid Other | Attending: Maternal & Fetal Medicine

## 2023-09-27 ENCOUNTER — Other Ambulatory Visit: Payer: Self-pay | Admitting: *Deleted

## 2023-09-27 VITALS — BP 132/78 | HR 97

## 2023-09-27 DIAGNOSIS — Z3A28 28 weeks gestation of pregnancy: Secondary | ICD-10-CM | POA: Diagnosis not present

## 2023-09-27 DIAGNOSIS — O285 Abnormal chromosomal and genetic finding on antenatal screening of mother: Secondary | ICD-10-CM | POA: Diagnosis not present

## 2023-09-27 DIAGNOSIS — O99019 Anemia complicating pregnancy, unspecified trimester: Secondary | ICD-10-CM | POA: Insufficient documentation

## 2023-09-27 DIAGNOSIS — O35EXX0 Maternal care for other (suspected) fetal abnormality and damage, fetal genitourinary anomalies, not applicable or unspecified: Secondary | ICD-10-CM | POA: Insufficient documentation

## 2023-09-27 DIAGNOSIS — O09293 Supervision of pregnancy with other poor reproductive or obstetric history, third trimester: Secondary | ICD-10-CM

## 2023-09-27 DIAGNOSIS — Z8759 Personal history of other complications of pregnancy, childbirth and the puerperium: Secondary | ICD-10-CM

## 2023-09-27 DIAGNOSIS — D573 Sickle-cell trait: Secondary | ICD-10-CM | POA: Insufficient documentation

## 2023-09-27 LAB — CBC
Hematocrit: 35.4 % (ref 34.0–46.6)
Hemoglobin: 11.8 g/dL (ref 11.1–15.9)
MCH: 28.9 pg (ref 26.6–33.0)
MCHC: 33.3 g/dL (ref 31.5–35.7)
MCV: 87 fL (ref 79–97)
Platelets: 231 10*3/uL (ref 150–450)
RBC: 4.09 x10E6/uL (ref 3.77–5.28)
RDW: 13.5 % (ref 11.7–15.4)
WBC: 13.3 10*3/uL — ABNORMAL HIGH (ref 3.4–10.8)

## 2023-09-27 LAB — GLUCOSE TOLERANCE, 2 HOURS W/ 1HR
Glucose, 1 hour: 88 mg/dL (ref 70–179)
Glucose, 2 hour: 99 mg/dL (ref 70–152)
Glucose, Fasting: 69 mg/dL — ABNORMAL LOW (ref 70–91)

## 2023-09-27 LAB — RPR: RPR Ser Ql: NONREACTIVE

## 2023-09-27 LAB — HIV ANTIBODY (ROUTINE TESTING W REFLEX): HIV Screen 4th Generation wRfx: NONREACTIVE

## 2023-09-28 LAB — URINE CULTURE, OB REFLEX

## 2023-09-28 LAB — CULTURE, OB URINE

## 2023-09-30 ENCOUNTER — Encounter: Payer: Self-pay | Admitting: Obstetrics and Gynecology

## 2023-09-30 NOTE — Progress Notes (Signed)
   LOW-RISK PREGNANCY OFFICE VISIT Patient name: Andrea Archer MRN [id number]  Date of birth: August 18, 2001 Chief Complaint:   Routine Prenatal Visit  History of Present Illness:   Andrea Archer is a 22 y.o. G21P1011 female at [redacted]w[redacted]d with an Estimated Date of Delivery: 12/20/23 being seen today for ongoing management of a low-risk pregnancy.  Today she reports  wanting to know if urine cleared form pyelonephritis. She was recently hospitalized for pyelo . Contractions: Irritability. Vag. Bleeding: None.  Movement: Present. denies leaking of fluid. Review of Systems:   Pertinent items are noted in HPI Denies abnormal vaginal discharge w/ itching/odor/irritation, headaches, visual changes, shortness of breath, chest pain, abdominal pain, severe nausea/vomiting, or problems with urination or bowel movements unless otherwise stated above. Pertinent History Reviewed:  Reviewed past medical,surgical, social, obstetrical and family history.  Reviewed problem list, medications and allergies. Physical Assessment:   Vitals:   09/26/23 0922  BP: 119/75  Pulse: 91  Weight: 183 lb 9.6 oz (83.3 kg)  Body mass index is 29.63 kg/m.        Physical Examination:   General appearance: Well appearing, and in no distress  Mental status: Alert, oriented to person, place, and time  Skin: Warm & dry  Cardiovascular: Normal heart rate noted  Respiratory: Normal respiratory effort, no distress  Abdomen: Soft, gravid, nontender  Pelvic: Cervical exam deferred         Extremities: Edema: None  Fetal Status: Fetal Heart Rate (bpm): 130   Movement: Present    No results found for this or any previous visit (from the past 24 hour(s)).  Assessment & Plan:  1) Low-risk pregnancy G3P1011 at [redacted]w[redacted]d with an Estimated Date of Delivery: 12/20/23   2) Supervision of other normal pregnancy, antepartum - HIV antibody (with reflex) - RPR - CBC - Glucose Tolerance, 2 Hours w/1 Hour - Tdap vaccine greater than or equal to  7yo IM - Flu vaccine trivalent PF, 6mos and older(Flulaval,Afluria,Fluarix,Fluzone)  3) Constipation during pregnancy in third trimester - Rx: Magnesium 200 MG TABS; Take 2 tablets (400 mg total) by mouth at bedtime.  Dispense: 30 tablet; Refill: 1  4) Pyelonephritis affecting pregnancy in second trimester - Culture, OB Urine  5) [redacted] weeks gestation of pregnancy    Meds:  Meds ordered this encounter  Medications   Magnesium 200 MG TABS    Sig: Take 2 tablets (400 mg total) by mouth at bedtime.    Dispense:  30 tablet    Refill:  1    Order Specific Question:   Supervising Provider    Answer:   Reva Bores [[id number]]   Labs/procedures today: UCx -- Results pending   Plan:  Continue routine obstetrical care   Reviewed: Preterm labor symptoms and general obstetric precautions including but not limited to vaginal bleeding, contractions, leaking of fluid and fetal movement were reviewed in detail with the patient.  All questions were answered. Has home bp cuff. Check bp weekly, let us know if >140/90.   Follow-up: Return in about 2 weeks (around 10/10/2023) for Return OB visit.  Orders Placed This Encounter  Procedures   Culture, OB Urine   Urine Culture, OB Reflex   Tdap vaccine greater than or equal to 7yo IM   Flu vaccine trivalent PF, 6mos and older(Flulaval,Afluria,Fluarix,Fluzone)   HIV antibody (with reflex)   RPR   CBC   Glucose Tolerance, 2 Hours w/1 Hour   Raelyn Mora MSN, CNM 09/26/2023

## 2023-10-01 ENCOUNTER — Encounter: Payer: Self-pay | Admitting: Obstetrics and Gynecology

## 2023-10-01 ENCOUNTER — Other Ambulatory Visit: Payer: Self-pay | Admitting: *Deleted

## 2023-10-01 DIAGNOSIS — Z8759 Personal history of other complications of pregnancy, childbirth and the puerperium: Secondary | ICD-10-CM

## 2023-10-02 ENCOUNTER — Ambulatory Visit: Payer: Medicaid Other | Admitting: Physical Therapy

## 2023-10-02 ENCOUNTER — Ambulatory Visit: Payer: Medicaid Other | Attending: Student | Admitting: Physical Therapy

## 2023-10-02 DIAGNOSIS — M5459 Other low back pain: Secondary | ICD-10-CM | POA: Diagnosis present

## 2023-10-02 DIAGNOSIS — M6281 Muscle weakness (generalized): Secondary | ICD-10-CM | POA: Insufficient documentation

## 2023-10-02 DIAGNOSIS — M5442 Lumbago with sciatica, left side: Secondary | ICD-10-CM | POA: Insufficient documentation
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
## 2023-10-03 ENCOUNTER — Ambulatory Visit (INDEPENDENT_AMBULATORY_CARE_PROVIDER_SITE_OTHER): Payer: Medicaid Other | Admitting: Obstetrics and Gynecology

## 2023-10-03 VITALS — BP 128/78 | HR 85 | Wt 182.0 lb

## 2023-10-03 DIAGNOSIS — N898 Other specified noninflammatory disorders of vagina: Secondary | ICD-10-CM

## 2023-10-03 DIAGNOSIS — Z3A28 28 weeks gestation of pregnancy: Secondary | ICD-10-CM

## 2023-10-03 DIAGNOSIS — Z3480 Encounter for supervision of other normal pregnancy, unspecified trimester: Secondary | ICD-10-CM

## 2023-10-03 NOTE — Progress Notes (Signed)
   LOW-RISK PREGNANCY OFFICE VISIT Patient name: Andrea Archer MRN [id number]  Date of birth: 01/19/2001 Chief Complaint:   Routine Prenatal Visit  History of Present Illness:   Andrea Archer is a 22 y.o. G4P1011 female at [redacted]w[redacted]d with an Estimated Date of Delivery: 12/20/23 being seen today for ongoing management of a low-risk pregnancy.  Today she reports  lost her mucous plug . Contractions: Irritability. Vag. Bleeding: None.  Movement: Present. denies leaking of fluid. Review of Systems:   Pertinent items are noted in HPI Denies abnormal vaginal discharge w/ itching/odor/irritation, headaches, visual changes, shortness of breath, chest pain, abdominal pain, severe nausea/vomiting, or problems with urination or bowel movements unless otherwise stated above. Pertinent History Reviewed:  Reviewed past medical,surgical, social, obstetrical and family history.  Reviewed problem list, medications and allergies. Physical Assessment:   Vitals:   10/03/23 1455  BP: 128/78  Pulse: 85  Weight: 182 lb (82.6 kg)  Body mass index is 29.38 kg/m.        Physical Examination:   General appearance: Well appearing, and in no distress  Mental status: Alert, oriented to person, place, and time  Skin: Warm & dry  Cardiovascular: Normal heart rate noted  Respiratory: Normal respiratory effort, no distress  Abdomen: Soft, gravid, nontender  Pelvic: Cervical exam performed  Dilation: Closed Effacement (%): Thick Station: Ballotable  Extremities: Edema: None  Fetal Status: Fetal Heart Rate (bpm): 145   Movement: Present    No results found for this or any previous visit (from the past 24 hour(s)).  Assessment & Plan:  1) Low-risk pregnancy G3P1011 at [redacted]w[redacted]d with an Estimated Date of Delivery: 12/20/23   2) Supervision of other normal pregnancy, antepartum - Normal OB  3) Vaginal discharge - Advised it is normal variation to lose some mucous plug at this gestation, but there was none seen on  exam  4) [redacted] weeks gestation of pregnancy     Meds: No orders of the defined types were placed in this encounter.  Labs/procedures today: cervical exam  Plan:  Continue routine obstetrical care   Reviewed: Preterm labor symptoms and general obstetric precautions including but not limited to vaginal bleeding, contractions, leaking of fluid and fetal movement were reviewed in detail with the patient.  All questions were answered. Has home bp cuff. Check bp weekly, let us know if >140/90.   Follow-up: 10/10/2023  No orders of the defined types were placed in this encounter.  Raelyn Mora MSN, CNM 10/03/2023 3:20 PM

## 2023-10-09 ENCOUNTER — Ambulatory Visit: Payer: Medicaid Other | Admitting: Rehabilitative and Restorative Service Providers"

## 2023-10-09 ENCOUNTER — Telehealth: Payer: Self-pay | Admitting: Rehabilitative and Restorative Service Providers"

## 2023-10-09 NOTE — Telephone Encounter (Signed)
Contacted patient secondary to missed visit.  She states that she thought that she cancelled it via My Chart, but she has been really busy, so she did not call to follow up.  Patient states that she is compliant with her HEP and obtained a yoga ball.  Patient advised that she was at the end of her certification period, recommend discharging at this time.  Patient verbalizes that she is independent with HEP and is good with discharge from PT at this time to continue independently.

## 2023-10-10 ENCOUNTER — Ambulatory Visit: Payer: Medicaid Other | Admitting: Obstetrics and Gynecology

## 2023-10-10 VITALS — BP 120/75 | HR 94 | Wt 187.4 lb

## 2023-10-10 DIAGNOSIS — Z8759 Personal history of other complications of pregnancy, childbirth and the puerperium: Secondary | ICD-10-CM

## 2023-10-10 DIAGNOSIS — Z3480 Encounter for supervision of other normal pregnancy, unspecified trimester: Secondary | ICD-10-CM

## 2023-10-10 DIAGNOSIS — Z3A29 29 weeks gestation of pregnancy: Secondary | ICD-10-CM

## 2023-10-10 NOTE — Progress Notes (Signed)
   PRENATAL VISIT NOTE  Subjective:  Andrea Archer is a 22 y.o. G3P1011 at [redacted]w[redacted]d being seen today for ongoing prenatal care.  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  Patient reports  occasional pelvic pressure . Finished PT, back pain is a little better with continued exercises/stretches  Contractions: Irritability (cramps while going to the bathroom). Vag. Bleeding: None.  Movement: Present. Denies leaking of fluid.   The following portions of the patient's history were reviewed and updated as appropriate: allergies, current medications, past family history, past medical history, past social history, past surgical history and problem list.   Objective:   Vitals:   10/10/23 1045  BP: 120/75  Pulse: 94  Weight: 187 lb 6.4 oz (85 kg)    Fetal Status: Fetal Heart Rate (bpm): 143 Fundal Height: 29 cm Movement: Present     General:  Alert, oriented and cooperative. Patient is in no acute distress.  Skin: Skin is warm and dry. No rash noted.   Cardiovascular: Normal heart rate noted  Respiratory: Normal respiratory effort, no problems with respiration noted  Abdomen: Soft, gravid, appropriate for gestational age.  Pain/Pressure: Present (hurts all over)     Pelvic: Cervical exam deferred        Extremities: Normal range of motion.  Edema: None  Mental Status: Normal mood and affect. Normal behavior. Normal judgment and thought content.   Assessment and Plan:  Pregnancy: G3P1011 at [redacted]w[redacted]d 1. Supervision of other normal pregnancy, antepartum BP and FHR normal Doing well, feeling regular movement  FH appropriate  2. [redacted] weeks gestation of pregnancy Supportive  measures for pelvic pressure Birth plan and water birth certificate in media   3. History of gestational hypertension Normotensive Follow up growth 11/8  Preterm labor symptoms and general obstetric precautions including but not limited to vaginal bleeding, contractions, leaking of fluid and fetal movement were reviewed in detail with the patient. Please refer to After Visit Summary for other counseling recommendations.   Return in about 2 weeks (around 10/24/2023) for OB VISIT (MD or APP).  Future Appointments  Date Time Provider Department Center  10/24/2023 10:35 AM Adam Phenix, MD [facility] None  11/07/2023 10:35 AM Raelyn Mora, CNM [facility] None  11/08/2023 10:30 AM [facility]  11/21/2023 10:55 AM Raelyn Mora, CNM [facility] None    Albertine Grates, FNP

## 2023-10-16 ENCOUNTER — Other Ambulatory Visit: Payer: Self-pay

## 2023-10-16 ENCOUNTER — Inpatient Hospital Stay (HOSPITAL_COMMUNITY)
Admission: AD | Admit: 2023-10-16 | Discharge: 2023-10-16 | Disposition: A | Discharge status: Home / Self Care | Payer: Medicaid Other | Attending: Obstetrics & Gynecology | Admitting: Obstetrics & Gynecology

## 2023-10-16 ENCOUNTER — Encounter (HOSPITAL_COMMUNITY): Payer: Self-pay | Admitting: Obstetrics & Gynecology

## 2023-10-16 ENCOUNTER — Inpatient Hospital Stay (HOSPITAL_COMMUNITY): Payer: Medicaid Other

## 2023-10-16 DIAGNOSIS — M545 Low back pain, unspecified: Secondary | ICD-10-CM | POA: Diagnosis not present

## 2023-10-16 DIAGNOSIS — O26893 Other specified pregnancy related conditions, third trimester: Secondary | ICD-10-CM | POA: Insufficient documentation

## 2023-10-16 DIAGNOSIS — R102 Pelvic and perineal pain: Secondary | ICD-10-CM | POA: Insufficient documentation

## 2023-10-16 DIAGNOSIS — R519 Headache, unspecified: Secondary | ICD-10-CM | POA: Diagnosis not present

## 2023-10-16 DIAGNOSIS — Z3A30 30 weeks gestation of pregnancy: Secondary | ICD-10-CM | POA: Diagnosis not present

## 2023-10-16 DIAGNOSIS — Z3480 Encounter for supervision of other normal pregnancy, unspecified trimester: Secondary | ICD-10-CM

## 2023-10-16 DIAGNOSIS — R109 Unspecified abdominal pain: Secondary | ICD-10-CM

## 2023-10-16 LAB — URINALYSIS, ROUTINE W REFLEX MICROSCOPIC
Bilirubin Urine: NEGATIVE
Glucose, UA: NEGATIVE mg/dL
Hgb urine dipstick: NEGATIVE
Ketones, ur: NEGATIVE mg/dL
Leukocytes,Ua: NEGATIVE
Nitrite: NEGATIVE
Protein, ur: NEGATIVE mg/dL
Specific Gravity, Urine: 1.01 (ref 1.005–1.030)
pH: 6 (ref 5.0–8.0)

## 2023-10-16 LAB — WET PREP, GENITAL
Clue Cells Wet Prep HPF POC: NONE SEEN
Sperm: NONE SEEN
Trich, Wet Prep: NONE SEEN
WBC, Wet Prep HPF POC: <10 (ref <10)
Yeast Wet Prep HPF POC: NONE SEEN

## 2023-10-16 MED ORDER — CYCLOBENZAPRINE HCL 5 MG PO TABS
10.0000 mg | ORAL_TABLET | Freq: Once | ORAL | Status: AC
  Administered 2023-10-16: 10 mg via ORAL
  Filled 2023-10-16: qty 2

## 2023-10-16 MED ORDER — CYCLOBENZAPRINE HCL 10 MG PO TABS: 10.0000 mg | ORAL_TABLET | Freq: Three times a day (TID) | ORAL | 0 refills | Status: DC | PRN

## 2023-10-16 MED ORDER — ACETAMINOPHEN-CAFFEINE 500-65 MG PO TABS
2.0000 | ORAL_TABLET | Freq: Once | ORAL | Status: AC
  Administered 2023-10-16: 2 via ORAL
  Filled 2023-10-16: qty 2

## 2023-10-16 NOTE — MAU Note (Signed)
Andrea Archer is a 22 y.o. at [redacted]w[redacted]d here in MAU reporting: she's having right sided lower back abdominal pain that began 3 days ago, reports pain is worsening.  States RLQ pain is sharp, tightening pressure and back pain is sore and aching.  States had kidney infection 1 month ago and pain is similar.  Also reports has HA located on right side behind right eye.  Reports hasn't taken any meds to treat pain.  Denies VB, unsure if LOF.  Endorses +FM. LMP: NA Onset of complaint: 3 days Pain score: 8 HA & 9 abdomen & back Vitals:   10/16/23 1113  BP: 113/73  Pulse: (!) 105  Resp: 18  Temp: 97.9 F (36.6 C)  SpO2: 100%     FHT:138 bpm Lab orders placed from triage:   UA

## 2023-10-16 NOTE — MAU Provider Note (Cosign Needed Addendum)
History     CSN: [id number]  Arrival date and time: 10/16/23 1023   Event Date/Time   First Provider Initiated Contact with Patient 10/16/23 1232      Chief Complaint  Patient presents with   Abdominal Pain   Back Pain   HPI [name] is a 22 y.o. year old G17P1011 female at [redacted]w[redacted]d weeks gestation who presents to MAU reporting right sided lower back pain lower abdominal pain that began 3 days ago.  She reports the pain is worsening; pain rated 9/10.  She describes the pain in her lower back as sharp, tightening, pressure, sore, and aching.  She reports she had a kidney infection 1 month ago and the pain is similar to that.  She also reports having a right sided behind her right eye headache but has taken no medications for it that headache; rated 8/10.  She denies any vaginal bleeding. She receives [facility] with [facility]; next appt is 10/24/2023.   OB History     Gravida  3   Para  1   Term  1   Preterm  0   AB  1   Living  1      SAB  1   IAB  0   Ectopic  0   Multiple  0   Live Births  1           Past Medical History:  Diagnosis Date   Anemia    Anxiety    Bacterial vaginitis    Depression    Hypertension    with pregnancy   Pregnant    Sickle cell trait (HCC)    UTI (urinary tract infection)     Past Surgical History:  Procedure Laterality Date   FINGER SURGERY Left    Pinky Finger   FRACTURE SURGERY  01/09/2019   left pinky finger    Family History  Problem Relation Age of Onset   Cancer Mother        cervical ca 2023   Hypertension Mother    Hypertension Father    Heart disease Father    Sickle cell anemia Father    Sickle cell trait Father        has sickle cell   Diabetes Maternal Grandmother    Hypertension Maternal Grandmother    Heart disease Maternal Grandmother    Fibromyalgia Maternal Grandmother    Hypertension Maternal Grandfather    Pancreatic cancer Maternal Grandfather    Hypertension Paternal Grandmother     Hypertension Paternal Grandfather    Colon cancer Neg Hx    Stomach cancer Neg Hx    Esophageal cancer Neg Hx    Rectal cancer Neg Hx     Social History   Tobacco Use   Smoking status: Former    Types: Cigars    Passive exposure: Past   Smokeless tobacco: Never   Tobacco comments:    Quit 2022  Vaping Use   Vaping status: Never Used  Substance Use Topics   Alcohol use: Not Currently    Comment: occ, not since confirmed pregnancy   Drug use: No    Allergies:  Allergies  Allergen Reactions   Other     Henna - rash   Pineapple Rash    Medications Prior to Admission  Medication Sig Dispense Refill Last Dose   cyclobenzaprine (FLEXERIL) 10 MG tablet Take 1 tablet (10 mg total) by mouth 3 (three) times daily as needed for muscle spasms. 30 tablet 0  Past Month   Prenatal Vit-Fe Phos-FA-Omega (VITAFOL GUMMIES) 3.33-0.333-34.8 MG CHEW Chew 1 tablet by mouth daily. 90 tablet 5 10/15/2023   Blood Pressure Monitoring (BLOOD PRESSURE KIT) DEVI 1 kit by Does not apply route once a week. (Patient not taking: Reported on 09/17/2023) 1 each 0    cephALEXin (KEFLEX) 500 MG capsule Take 1 capsule (500 mg total) by mouth 4 (four) times daily. (Patient not taking: Reported on 09/17/2023) 28 capsule 0    Magnesium 200 MG TABS Take 2 tablets (400 mg total) by mouth at bedtime. 30 tablet 1    neomycin-polymyxin b-dexamethasone (MAXITROL) 3.5-10000-0.1 SUSP Place 1 drop into the right eye every 6 (six) hours. (Patient not taking: Reported on 09/17/2023) 5 mL 0    valACYclovir (VALTREX) 1000 MG tablet Take 1 tablet (1,000 mg total) by mouth 2 (two) times daily. Take 1 tablet twice a day for 10 days. 20 tablet 11 Unknown    Review of Systems  Constitutional: Negative.   HENT: Negative.    Eyes: Negative.   Respiratory: Negative.    Cardiovascular: Negative.   Gastrointestinal: Negative.   Endocrine: Negative.   Genitourinary:  Positive for flank pain and pelvic pain.  Musculoskeletal:  Positive  for back pain.  Skin: Negative.   Allergic/Immunologic: Negative.   Neurological: Negative.   Hematological: Negative.   Psychiatric/Behavioral: Negative.     Physical Exam   Blood pressure 114/71, pulse 92, temperature 97.9 F (36.6 C), temperature source Oral, resp. rate 20, height 5\' 6"  (1.676 m), weight 86.9 kg, last menstrual period 03/15/2023, SpO2 99%.  Physical Exam Vitals and nursing note reviewed. Exam conducted with a chaperone present.  Constitutional:      Appearance: Normal appearance. She is normal weight.  Cardiovascular:     Rate and Rhythm: Normal rate.  Pulmonary:     Effort: Pulmonary effort is normal.  Abdominal:     Palpations: Abdomen is soft.  Genitourinary:    General: Normal vulva.  Musculoskeletal:        General: Normal range of motion.  Skin:    General: Skin is warm and dry.  Neurological:     Mental Status: She is alert and oriented to person, place, and time.  Psychiatric:        Mood and Affect: Mood normal.        Behavior: Behavior normal.        Thought Content: Thought content normal.        Judgment: Judgment normal.    REACTIVE NST - FHR: 135 bpm / moderate variability / accels present / decels absent / TOCO: UI noted  MAU Course  Procedures  MDM CCUA UCx-- Results pending  Flexeril 10 mg po -- reduced pain Excedrin Tension H/A 2 caplets -- resolved H/A  Results for orders placed or performed during the hospital encounter of 10/16/23 (from the past 24 hour(s))  Urinalysis, Routine w reflex microscopic -Urine, Clean Catch     Status: Abnormal   Collection Time: 10/16/23 12:14 PM  Result Value Ref Range   Color, Urine YELLOW YELLOW   APPearance HAZY (A) CLEAR   Specific Gravity, Urine 1.010 1.005 - 1.030   pH 6.0 5.0 - 8.0   Glucose, UA NEGATIVE NEGATIVE mg/dL   Hgb urine dipstick NEGATIVE NEGATIVE   Bilirubin Urine NEGATIVE NEGATIVE   Ketones, ur NEGATIVE NEGATIVE mg/dL   Protein, ur NEGATIVE NEGATIVE mg/dL   Nitrite  NEGATIVE NEGATIVE   Leukocytes,Ua NEGATIVE NEGATIVE  Wet prep, genital  Status: None   Collection Time: 10/16/23 12:55 PM   Specimen: Vaginal  Result Value Ref Range   Yeast Wet Prep HPF POC NONE SEEN NONE SEEN   Trich, Wet Prep NONE SEEN NONE SEEN   Clue Cells Wet Prep HPF POC NONE SEEN NONE SEEN   WBC, Wet Prep HPF POC <10 <10   Sperm NONE SEEN     CT Renal Stone Study  Result Date: 10/16/2023 CLINICAL DATA:  Thirty weeks 5 days pregnant with right-sided lower back and abdominal pain that began 3 days ago. Pain is worsening. EXAM: CT ABDOMEN AND PELVIS WITHOUT CONTRAST TECHNIQUE: Multidetector CT imaging of the abdomen and pelvis was performed following the standard protocol without IV contrast. RADIATION DOSE REDUCTION: This exam was performed according to the departmental dose-optimization program which includes automated exposure control, adjustment of the mA and/or kV according to patient size and/or use of iterative reconstruction technique. COMPARISON:  CT 12/28/2022 FINDINGS: Lower chest: No acute abnormality. Hepatobiliary: Unremarkable liver. Normal gallbladder. No biliary dilation. Pancreas: Unremarkable. Spleen: Unremarkable. Adrenals/Urinary Tract: Normal adrenal glands. No urinary calculi or hydronephrosis. Bladder is unremarkable. Stomach/Bowel: Normal caliber large and small bowel. No bowel wall thickening. The appendix is normal.Stomach is within normal limits. Vascular/Lymphatic: No significant vascular findings are present. No enlarged abdominal or pelvic lymph nodes. Reproductive: Gravid uterus with fetus in cephalic position. The ovaries are not well visualized. Other: No free intraperitoneal fluid or air. Musculoskeletal: No acute fracture. IMPRESSION: 1. No CT correlate for right-sided back or abdominal pain. Normal appendix. No urinary calculi or hydronephrosis. 2. Gravid uterus with fetus in cephalic position. Electronically Signed   By: Minerva Fester M.D.   On:  10/16/2023 18:04     Assessment and Plan  1. Right flank pain - Rx: Flexeril 10 mg po TID prn pain - Ok to take with Tylenol 1000 mg for extra pain management  2. Pelvic pain affecting pregnancy in third trimester, antepartum - Information provided on Abdominal pain in pregnancy   3. Pelvic pressure in pregnancy, antepartum, third trimester - Information provided on Abdominal pain in pregnancy   4. [redacted] weeks gestation of pregnancy   - Discharge patient - Keep scheduled appt with [facility] on 10/24/2023 - Patient verbalized an understanding of the plan of care and agrees.   Raelyn Mora, CNM 10/16/2023, 12:32 PM

## 2023-10-17 LAB — GC/CHLAMYDIA PROBE AMP (~~LOC~~) NOT AT ARMC
Chlamydia: NEGATIVE
Comment: NEGATIVE
Comment: NORMAL
Neisseria Gonorrhea: NEGATIVE

## 2023-10-17 LAB — CULTURE, OB URINE: Culture: <10000 — AB

## 2023-10-24 ENCOUNTER — Ambulatory Visit: Payer: Medicaid Other | Admitting: Obstetrics & Gynecology

## 2023-10-24 VITALS — BP 134/81 | HR 109

## 2023-10-24 DIAGNOSIS — Z3480 Encounter for supervision of other normal pregnancy, unspecified trimester: Secondary | ICD-10-CM

## 2023-10-24 DIAGNOSIS — Z8759 Personal history of other complications of pregnancy, childbirth and the puerperium: Secondary | ICD-10-CM

## 2023-10-24 MED ORDER — DOCUSATE SODIUM 100 MG PO CAPS
100.0000 mg | ORAL_CAPSULE | Freq: Two times a day (BID) | ORAL | 2 refills | Status: DC | PRN
Start: 2023-10-24 — End: 2023-11-26

## 2023-10-24 NOTE — Progress Notes (Signed)
Pt states she is in severe pain, mostly very low pelvis and left groin area.

## 2023-10-24 NOTE — Progress Notes (Signed)
   PRENATAL VISIT NOTE  Subjective:  Andrea Archer is a 22 y.o. G3P1011 at [redacted]w[redacted]d being seen today for ongoing prenatal care.  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  Patient reports  lots of pelvic pain, has instructions from PT for management .  Contractions: Not present. Vag. Bleeding: None.  Movement: Present. Denies leaking of fluid.   The following portions of the patient's history were reviewed and updated as appropriate: allergies, current medications, past family history, past medical history, past social history, past surgical history and problem list.   Objective:   Vitals:   10/24/23 1051  BP: 134/81  Pulse: (!) 109    Fetal Status: Fetal Heart Rate (bpm): 135   Movement: Present     General:  Alert, oriented and cooperative. Patient is in no acute distress.  Skin: Skin is warm and dry. No rash noted.   Cardiovascular: Normal heart rate noted  Respiratory: Normal respiratory effort, no problems with respiration noted  Abdomen: Soft, gravid, appropriate for gestational age.  Pain/Pressure: Present     Pelvic: Cervical exam deferred        Extremities: Normal range of motion.     Mental Status: Normal mood and affect. Normal behavior. Normal judgment and thought content.   Assessment and Plan:  Pregnancy: G3P1011 at [redacted]w[redacted]d 1. Supervision of other normal pregnancy, antepartum PT instructions being followed  2. History of gestational hypertension <135/85  Preterm labor symptoms and general obstetric precautions including but not limited to vaginal bleeding, contractions, leaking of fluid and fetal movement were reviewed in detail with the  patient. Please refer to After Visit Summary for other counseling recommendations.   Return in about 2 weeks (around 11/07/2023).  Future Appointments  Date Time Provider Department Center  11/07/2023 10:35 AM Raelyn Mora, CNM [facility] None  11/08/2023 10:30 AM [facility]  11/21/2023 10:55 AM Raelyn Mora, CNM [facility] None    Scheryl Darter, MD

## 2023-11-05 ENCOUNTER — Telehealth: Payer: Self-pay

## 2023-11-05 ENCOUNTER — Inpatient Hospital Stay (HOSPITAL_COMMUNITY)
Admission: AD | Admit: 2023-11-05 | Discharge: 2023-11-06 | Disposition: A | Discharge status: Home / Self Care | Payer: Medicaid Other | Attending: Obstetrics and Gynecology | Admitting: Obstetrics and Gynecology

## 2023-11-05 DIAGNOSIS — O26893 Other specified pregnancy related conditions, third trimester: Secondary | ICD-10-CM | POA: Insufficient documentation

## 2023-11-05 DIAGNOSIS — I10 Essential (primary) hypertension: Secondary | ICD-10-CM

## 2023-11-05 DIAGNOSIS — O99013 Anemia complicating pregnancy, third trimester: Secondary | ICD-10-CM | POA: Insufficient documentation

## 2023-11-05 DIAGNOSIS — O10913 Unspecified pre-existing hypertension complicating pregnancy, third trimester: Secondary | ICD-10-CM | POA: Insufficient documentation

## 2023-11-05 DIAGNOSIS — Z3A33 33 weeks gestation of pregnancy: Secondary | ICD-10-CM | POA: Insufficient documentation

## 2023-11-05 DIAGNOSIS — Z3480 Encounter for supervision of other normal pregnancy, unspecified trimester: Secondary | ICD-10-CM

## 2023-11-05 NOTE — Telephone Encounter (Signed)
Patient called during lunch complaining of an elevated BP reading 141/94 and lower abdominal pain. Patient was advised to go to MAU for evaluation.  Called patient to follow up. Patient states that BP was elevated after walking up the stairs. She then laid down to see if lower abdominal pain would improve.  BP during time of call was normal range. Patient advised to recorded in baby rx. Also states that abdominal pain is improving.  Advised to continue to monitor symptoms and to reports to MAU if symptoms worsen.  Endorses good fetal movement

## 2023-11-06 ENCOUNTER — Encounter (HOSPITAL_COMMUNITY): Payer: Self-pay | Admitting: Obstetrics and Gynecology

## 2023-11-06 ENCOUNTER — Other Ambulatory Visit: Payer: Self-pay | Admitting: *Deleted

## 2023-11-06 DIAGNOSIS — I10 Essential (primary) hypertension: Secondary | ICD-10-CM | POA: Diagnosis not present

## 2023-11-06 DIAGNOSIS — Z3480 Encounter for supervision of other normal pregnancy, unspecified trimester: Secondary | ICD-10-CM

## 2023-11-06 DIAGNOSIS — R102 Pelvic and perineal pain: Secondary | ICD-10-CM

## 2023-11-06 DIAGNOSIS — O10913 Unspecified pre-existing hypertension complicating pregnancy, third trimester: Secondary | ICD-10-CM | POA: Diagnosis present

## 2023-11-06 DIAGNOSIS — Z3A33 33 weeks gestation of pregnancy: Secondary | ICD-10-CM

## 2023-11-06 DIAGNOSIS — O26893 Other specified pregnancy related conditions, third trimester: Secondary | ICD-10-CM

## 2023-11-06 DIAGNOSIS — O99013 Anemia complicating pregnancy, third trimester: Secondary | ICD-10-CM | POA: Diagnosis not present

## 2023-11-06 LAB — URINALYSIS, ROUTINE W REFLEX MICROSCOPIC
Bilirubin Urine: NEGATIVE
Glucose, UA: NEGATIVE mg/dL
Hgb urine dipstick: NEGATIVE
Ketones, ur: NEGATIVE mg/dL
Leukocytes,Ua: NEGATIVE
Nitrite: NEGATIVE
Protein, ur: NEGATIVE mg/dL
Specific Gravity, Urine: 1.008 (ref 1.005–1.030)
pH: 6 (ref 5.0–8.0)

## 2023-11-06 LAB — COMPREHENSIVE METABOLIC PANEL
ALT: 15 U/L (ref 0–44)
AST: 18 U/L (ref 15–41)
Albumin: 2.6 g/dL — ABNORMAL LOW (ref 3.5–5.0)
Alkaline Phosphatase: 115 U/L (ref 38–126)
Anion gap: 7 (ref 5–15)
BUN: 8 mg/dL (ref 6–20)
CO2: 21 mmol/L — ABNORMAL LOW (ref 22–32)
Calcium: 8.7 mg/dL — ABNORMAL LOW (ref 8.9–10.3)
Chloride: 104 mmol/L (ref 98–111)
Creatinine, Ser: 0.72 mg/dL (ref 0.44–1.00)
GFR, Estimated: >60 mL/min (ref >60)
Glucose, Bld: 114 mg/dL — ABNORMAL HIGH (ref 70–99)
Potassium: 3.6 mmol/L (ref 3.5–5.1)
Sodium: 132 mmol/L — ABNORMAL LOW (ref 135–145)
Total Bilirubin: 0.3 mg/dL (ref <1.2)
Total Protein: 5.7 g/dL — ABNORMAL LOW (ref 6.5–8.1)

## 2023-11-06 LAB — CBC
HCT: 30.7 % — ABNORMAL LOW (ref 36.0–46.0)
Hemoglobin: 10.5 g/dL — ABNORMAL LOW (ref 12.0–15.0)
MCH: 27.4 pg (ref 26.0–34.0)
MCHC: 34.2 g/dL (ref 30.0–36.0)
MCV: 80.2 fL (ref 80.0–100.0)
Platelets: 213 10*3/uL (ref 150–400)
RBC: 3.83 MIL/uL — ABNORMAL LOW (ref 3.87–5.11)
RDW: 13.2 % (ref 11.5–15.5)
WBC: 12.5 10*3/uL — ABNORMAL HIGH (ref 4.0–10.5)
nRBC: 0 % (ref 0.0–0.2)

## 2023-11-06 LAB — PROTEIN / CREATININE RATIO, URINE
Creatinine, Urine: 64 mg/dL
Total Protein, Urine: <6 mg/dL

## 2023-11-06 MED ORDER — ACETAMINOPHEN-CAFFEINE 500-65 MG PO TABS: 2.0000 | ORAL_TABLET | Freq: Once | ORAL | Status: DC

## 2023-11-06 NOTE — Progress Notes (Signed)
Pt up to BR and then on R side once returning to bed

## 2023-11-06 NOTE — Progress Notes (Signed)
Written and verbal d/c instructions given and understanding voiced. 

## 2023-11-06 NOTE — Progress Notes (Signed)
Babyscripts alert received for elevated BP readings at 1056 am and 2313 pm on 11/05/23. Angelica Pou, RN followed up with pt following am reading. See telephone note. Pt was evaluated in MAU following pm reading. PTL ruled out. BP's trended down. Discharged in stable condition.

## 2023-11-06 NOTE — MAU Provider Note (Signed)
Chief Complaint:  Hypertension and Abdominal Pain   Event Date/Time   First Provider Initiated Contact with Patient 11/06/23 0106     HPI  HPI: Andrea Archer is a 22 y.o. G3P1011 at 34w5dwho presents to maternity admissions reporting pain in pelvis which she states is happening every night, comes and goes.  Also reports elevated blood pressure at home.  Wants cervix exam to make sure she is not dilated. No history of preterm labor.  . She reports good fetal movement, denies LOF, vaginal bleeding, h/a, dizziness, n/v, diarrhea, constipation or fever/chills.  She denies headache, visual changes or RUQ abdominal pain.  RN note: Andrea Archer is a 22 y.o. at [redacted]w[redacted]d here in MAU reporting abdominal pain every night but worse tonight. Has "lightening crotch". Reports good FM and denies LOF or VB. States her b/p was 141/94 at 1000. 140/90 at 1300. 131/87 at 1500. 138/99 at 2313. Has had occ h/a off and on but not currently.     Past Medical History: Past Medical History:  Diagnosis Date   Anemia    Anxiety    Bacterial vaginitis    Depression    Hypertension    with pregnancy   Pregnant    Sickle cell trait (HCC)    UTI (urinary tract infection)     Past obstetric history: OB History  Gravida Para Term Preterm AB Living  3 1 1  0 1 1  SAB IAB Ectopic Multiple Live Births  1 0 0 0 1    # Outcome Date GA Lbr Len/2nd Weight Sex Type Anes PTL Lv  3 Current           2 Term 11/16/19 [redacted]w[redacted]d 09:34 / 02:57 3286 g F Vag-Spont EPI  LIV  1 SAB             Past Surgical History: Past Surgical History:  Procedure Laterality Date   FINGER SURGERY Left    Pinky Finger   FRACTURE SURGERY  01/09/2019   left pinky finger    Family History: Family History  Problem Relation Age of Onset   Cancer Mother        cervical ca 2023   Hypertension Mother    Hypertension Father    Heart disease Father    Sickle cell anemia Father    Sickle cell trait Father        has sickle cell   Diabetes  Maternal Grandmother    Hypertension Maternal Grandmother    Heart disease Maternal Grandmother    Fibromyalgia Maternal Grandmother    Hypertension Maternal Grandfather    Pancreatic cancer Maternal Grandfather    Hypertension Paternal Grandmother    Hypertension Paternal Grandfather    Colon cancer Neg Hx    Stomach cancer Neg Hx    Esophageal cancer Neg Hx    Rectal cancer Neg Hx     Social History: Social History   Tobacco Use   Smoking status: Former    Types: Cigars    Passive exposure: Past   Smokeless tobacco: Never   Tobacco comments:    Quit 2022  Vaping Use   Vaping status: Never Used  Substance Use Topics   Alcohol use: Not Currently    Comment: occ, not since confirmed pregnancy   Drug use: No    Allergies:  Allergies  Allergen Reactions   Other     Henna - rash   Pineapple Rash    Meds:  Medications Prior to Admission  Medication Sig Dispense  Refill Last Dose   cyclobenzaprine (FLEXERIL) 10 MG tablet Take 1 tablet (10 mg total) by mouth 3 (three) times daily as needed for muscle spasms. 30 tablet 0 Past Week   Prenatal Vit-Fe Phos-FA-Omega (VITAFOL GUMMIES) 3.33-0.333-34.8 MG CHEW Chew 1 tablet by mouth daily. 90 tablet 5 11/05/2023   Blood Pressure Monitoring (BLOOD PRESSURE KIT) DEVI 1 kit by Does not apply route once a week. (Patient not taking: Reported on 09/17/2023) 1 each 0    cephALEXin (KEFLEX) 500 MG capsule Take 1 capsule (500 mg total) by mouth 4 (four) times daily. (Patient not taking: Reported on 09/17/2023) 28 capsule 0    docusate sodium (COLACE) 100 MG capsule Take 1 capsule (100 mg total) by mouth 2 (two) times daily as needed. (Patient not taking: Reported on 11/06/2023) 30 capsule 2 Not Taking   Magnesium 200 MG TABS Take 2 tablets (400 mg total) by mouth at bedtime. (Patient not taking: Reported on 11/06/2023) 30 tablet 1 Not Taking   neomycin-polymyxin b-dexamethasone (MAXITROL) 3.5-10000-0.1 SUSP Place 1 drop into the right eye every 6  (six) hours. (Patient not taking: Reported on 09/17/2023) 5 mL 0    valACYclovir (VALTREX) 1000 MG tablet Take 1 tablet (1,000 mg total) by mouth 2 (two) times daily. Take 1 tablet twice a day for 10 days. (Patient not taking: Reported on 11/06/2023) 20 tablet 11 Not Taking    I have reviewed patient's Past Medical Hx, Surgical Hx, Family Hx, Social Hx, medications and allergies.   ROS:  Review of Systems  Constitutional:  Negative for chills and fever.  Respiratory:  Negative for shortness of breath.   Gastrointestinal:  Negative for abdominal pain.  Genitourinary:  Positive for pelvic pain. Negative for vaginal bleeding.   Other systems negative  Physical Exam  Patient Vitals for the past 24 hrs:  BP Temp Pulse Resp SpO2 Height Weight  11/06/23 0052 118/79 -- (!) 103 -- -- -- --  11/06/23 0025 133/80 -- -- -- -- -- --  11/06/23 0019 -- (!) 97.5 F (36.4 C) 91 18 100 % 5\' 6"  (1.676 m) 88 kg   Vitals:   11/06/23 0025 11/06/23 0052 11/06/23 0119 11/06/23 0131  BP: 133/80 118/79 127/72 124/75   11/06/23 0144 11/06/23 0146 11/06/23 0201  BP: 113/63 107/66 (!) 105/59    Constitutional: Well-developed, well-nourished female in no acute distress.  Cardiovascular: normal rate Respiratory: normal effort GI: Abd soft, non-tender, gravid appropriate for gestational age.   No rebound or guarding. MS: Extremities nontender, no edema, normal ROM Neurologic: Alert and oriented x 4.  GU: Neg CVAT.  PELVIC EXAM:   Dilation: Closed Effacement (%): Thick Station: -3 Exam by:: Mayford Knife, CNM   FHT:  Baseline 140 , moderate variability, accelerations present, no decelerations Contractions:  Rare   Labs: Results for orders placed or performed during the hospital encounter of 11/05/23 (from the past 24 hour(s))  Urinalysis, Routine w reflex microscopic -Urine, Clean Catch     Status: Abnormal   Collection Time: 11/06/23 12:26 AM  Result Value Ref Range   Color, Urine YELLOW YELLOW    APPearance HAZY (A) CLEAR   Specific Gravity, Urine 1.008 1.005 - 1.030   pH 6.0 5.0 - 8.0   Glucose, UA NEGATIVE NEGATIVE mg/dL   Hgb urine dipstick NEGATIVE NEGATIVE   Bilirubin Urine NEGATIVE NEGATIVE   Ketones, ur NEGATIVE NEGATIVE mg/dL   Protein, ur NEGATIVE NEGATIVE mg/dL   Nitrite NEGATIVE NEGATIVE   Leukocytes,Ua NEGATIVE NEGATIVE  Protein /  creatinine ratio, urine     Status: None   Collection Time: 11/06/23 12:26 AM  Result Value Ref Range   Creatinine, Urine 64 mg/dL   Total Protein, Urine <6 mg/dL   Protein Creatinine Ratio        0.00 - 0.15 mg/mg[Cre]  CBC     Status: Abnormal   Collection Time: 11/06/23  2:13 AM  Result Value Ref Range   WBC 12.5 (H) 4.0 - 10.5 K/uL   RBC 3.83 (L) 3.87 - 5.11 MIL/uL   Hemoglobin 10.5 (L) 12.0 - 15.0 g/dL   HCT 91.4 (L) 78.2 - 95.6 %   MCV 80.2 80.0 - 100.0 fL   MCH 27.4 26.0 - 34.0 pg   MCHC 34.2 30.0 - 36.0 g/dL   RDW 21.3 08.6 - 57.8 %   Platelets 213 150 - 400 K/uL   nRBC 0.0 0.0 - 0.2 %  Comprehensive metabolic panel     Status: Abnormal   Collection Time: 11/06/23  2:13 AM  Result Value Ref Range   Sodium 132 (L) 135 - 145 mmol/L   Potassium 3.6 3.5 - 5.1 mmol/L   Chloride 104 98 - 111 mmol/L   CO2 21 (L) 22 - 32 mmol/L   Glucose, Bld 114 (H) 70 - 99 mg/dL   BUN 8 6 - 20 mg/dL   Creatinine, Ser 4.69 0.44 - 1.00 mg/dL   Calcium 8.7 (L) 8.9 - 10.3 mg/dL   Total Protein 5.7 (L) 6.5 - 8.1 g/dL   Albumin 2.6 (L) 3.5 - 5.0 g/dL   AST 18 15 - 41 U/L   ALT 15 0 - 44 U/L   Alkaline Phosphatase 115 38 - 126 U/L   Total Bilirubin 0.3 <1.2 mg/dL   GFR, Estimated >62 >95 mL/min   Anion gap 7 5 - 15    --/--/O POS (09/05 1950)  Imaging:    MAU Course/MDM: I have reviewed the triage vital signs and the nursing notes.   Pertinent labs & imaging results that were available during my care of the patient were reviewed by me and considered in my medical decision making (see chart for details).      I have reviewed her  medical records including past results, notes and treatments.   I have ordered labs and reviewed results. Labs normal BPs have been normal entire visit NST reviewed   Treatments in MAU included EFM.    Assessment: SIngle IUP at [redacted]w[redacted]d Isolated elevated BP at home, none here Pelvic pain related to ligament stretching No evidence of preterm labor  Plan: Discharge home Preterm Labor precautions and fetal kick counts Follow up in Office for prenatal visits and recheck of BP Encouraged to return if she develops worsening of symptoms, increase in pain, fever, or other concerning symptoms.   Pt stable at time of discharge.  Wynelle Bourgeois CNM, MSN Certified Nurse-Midwife 11/06/2023 1:07 AM

## 2023-11-06 NOTE — MAU Note (Signed)
.  Andrea Archer is a 22 y.o. at [redacted]w[redacted]d here in MAU reporting abdominal pain every night but worse tonight. Has "lightening crotch". Reports good FM and denies LOF or VB. States her b/p was 141/94 at 1000. 140/90 at 1300. 131/87 at 1500. 138/99 at 2313. Has had occ h/a off and on but not currently.   Onset of complaint: tonight.  Pain score: 8 Vitals:   11/06/23 0019 11/06/23 0025  BP:  133/80  Pulse: 91   Resp: 18   Temp: (!) 97.5 F (36.4 C)   SpO2: 100%      FHT:140 Lab orders placed from triage:  u/a

## 2023-11-07 ENCOUNTER — Encounter: Payer: Medicaid Other | Admitting: Obstetrics and Gynecology

## 2023-11-08 ENCOUNTER — Ambulatory Visit: Payer: Medicaid Other | Attending: Maternal & Fetal Medicine

## 2023-11-08 ENCOUNTER — Other Ambulatory Visit: Payer: Self-pay

## 2023-11-08 DIAGNOSIS — O99013 Anemia complicating pregnancy, third trimester: Secondary | ICD-10-CM | POA: Diagnosis not present

## 2023-11-08 DIAGNOSIS — Z3A34 34 weeks gestation of pregnancy: Secondary | ICD-10-CM | POA: Diagnosis not present

## 2023-11-08 DIAGNOSIS — Z8759 Personal history of other complications of pregnancy, childbirth and the puerperium: Secondary | ICD-10-CM | POA: Diagnosis present

## 2023-11-08 DIAGNOSIS — D573 Sickle-cell trait: Secondary | ICD-10-CM

## 2023-11-08 DIAGNOSIS — O09293 Supervision of pregnancy with other poor reproductive or obstetric history, third trimester: Secondary | ICD-10-CM

## 2023-11-14 ENCOUNTER — Encounter: Payer: Medicaid Other | Admitting: Obstetrics & Gynecology

## 2023-11-21 ENCOUNTER — Ambulatory Visit (INDEPENDENT_AMBULATORY_CARE_PROVIDER_SITE_OTHER): Payer: Medicaid Other | Admitting: Obstetrics

## 2023-11-21 ENCOUNTER — Encounter: Payer: Self-pay | Admitting: Obstetrics

## 2023-11-21 VITALS — BP 127/83 | HR 102 | Wt 200.0 lb

## 2023-11-21 DIAGNOSIS — Z8759 Personal history of other complications of pregnancy, childbirth and the puerperium: Secondary | ICD-10-CM

## 2023-11-21 DIAGNOSIS — Z3480 Encounter for supervision of other normal pregnancy, unspecified trimester: Secondary | ICD-10-CM

## 2023-11-21 NOTE — Progress Notes (Signed)
Pt reports fetal movement and pressure. ?

## 2023-11-21 NOTE — Progress Notes (Signed)
Subjective:  Andrea Archer is a 22 y.o. G3P1011 at [redacted]w[redacted]d being seen today for ongoing prenatal care.  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  Patient reports occasional contractions and pelvic pressure .   .  .   . Denies leaking of fluid.   The following portions of the patient's history were reviewed and updated as appropriate: allergies, current medications, past family history, past medical history, past social history, past surgical history and problem list. Problem list updated.  Objective:   Vitals:   11/21/23 1122  Weight: 200 lb (90.7 kg)    Fetal Status:           General:  Alert, oriented and cooperative. Patient is in no acute distress.  Skin: Skin is warm and dry. No rash noted.   Cardiovascular: Normal heart rate noted  Respiratory: Normal respiratory effort, no problems with respiration noted  Abdomen: Soft, gravid, appropriate for gestational age.       Pelvic:  Cervical exam deferred        Extremities: Normal range of motion.     Mental Status: Normal mood and affect. Normal behavior. Normal judgment and thought content.   Urinalysis:      Assessment and Plan:  Pregnancy: G3P1011 at 35 wks  1. Supervision of other normal pregnancy, antepartum  2. History of gestational hypertension - BP is good   Preterm labor symptoms and general obstetric precautions including but not limited to vaginal bleeding, contractions, leaking of fluid and fetal movement were reviewed in detail with the patient. Please refer to After Visit Summary for other counseling recommendations.   Return in about 1 week (around 11/28/2023) for ROB.   Brock Bad, MD  11/Nov/2024

## 2023-11-26 ENCOUNTER — Other Ambulatory Visit (HOSPITAL_COMMUNITY)
Admission: RE | Admit: 2023-11-26 | Discharge: 2023-11-26 | Disposition: A | Discharge status: Home / Self Care | Payer: Medicaid Other | Source: Ambulatory Visit | Attending: Advanced Practice Midwife | Admitting: Advanced Practice Midwife

## 2023-11-26 ENCOUNTER — Ambulatory Visit (INDEPENDENT_AMBULATORY_CARE_PROVIDER_SITE_OTHER): Payer: Medicaid Other | Admitting: Advanced Practice Midwife

## 2023-11-26 ENCOUNTER — Encounter: Payer: Self-pay | Admitting: Advanced Practice Midwife

## 2023-11-26 ENCOUNTER — Encounter: Payer: Medicaid Other | Admitting: Advanced Practice Midwife

## 2023-11-26 VITALS — BP 129/88 | HR 102 | Wt 200.0 lb

## 2023-11-26 DIAGNOSIS — Z3A36 36 weeks gestation of pregnancy: Secondary | ICD-10-CM

## 2023-11-26 DIAGNOSIS — Z8619 Personal history of other infectious and parasitic diseases: Secondary | ICD-10-CM

## 2023-11-26 DIAGNOSIS — Z3480 Encounter for supervision of other normal pregnancy, unspecified trimester: Secondary | ICD-10-CM | POA: Diagnosis present

## 2023-11-26 DIAGNOSIS — A6004 Herpesviral vulvovaginitis: Secondary | ICD-10-CM | POA: Insufficient documentation

## 2023-11-26 DIAGNOSIS — Z8759 Personal history of other complications of pregnancy, childbirth and the puerperium: Secondary | ICD-10-CM

## 2023-11-26 MED ORDER — VALACYCLOVIR HCL 500 MG PO TABS
500.0000 mg | ORAL_TABLET | Freq: Two times a day (BID) | ORAL | 1 refills | Status: DC
Start: 2023-11-26 — End: 2024-01-30

## 2023-11-26 NOTE — Progress Notes (Signed)
   PRENATAL VISIT NOTE  Subjective:  Andrea Archer is a 22 y.o. G3P1011 at [redacted]w[redacted]d being seen today for ongoing prenatal care.  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  Patient reports occasional contractions.  Contractions: Irregular.  .  Movement: Present. Denies leaking of fluid.   The following portions of the patient's history were reviewed and updated as appropriate: allergies, current medications, past family history, past medical history, past social history, past surgical history and problem list.   Objective:   Vitals:   11/26/23 1341  BP: 129/88  Pulse: (!) 102  Weight: 200 lb (90.7 kg)    Fetal Status: Fetal Heart Rate (bpm): 172 Fundal Height: 36 cm Movement: Present     General:  Alert, oriented and cooperative. Patient is in no acute distress.  Skin: Skin is warm and dry. No rash noted.   Cardiovascular: Normal heart rate noted  Respiratory: Normal respiratory effort, no problems with respiration noted  Abdomen: Soft, gravid, appropriate for gestational age.  Pain/Pressure: Present     Pelvic: Cervical exam performed in the presence of a chaperone Dilation: 1 Effacement (%): 0 Station: -3  Extremities: Normal range of motion.  Edema: None  Mental Status: Normal mood and affect. Normal behavior. Normal judgment and thought content.   Assessment and Plan:  Pregnancy: G3P1011 at [redacted]w[redacted]d 1. Supervision of other normal pregnancy, antepartum --Anticipatory guidance about next visits/weeks of pregnancy given.  --No barriers to waterbirth at this time --Reviewed labor readiness with patient including the [employer],  evening primrose oil, and raspberry leaf tea.    2. History of gestational hypertension --BP wnl today, no s/sx of PEC  3.Genital herpes hx --Valtrex BID  4. [redacted] weeks gestation of pregnancy   Term labor symptoms and general obstetric precautions including but not limited to vaginal bleeding, contractions, leaking of fluid and fetal movement were reviewed in detail with the patient. Please refer to After Visit Summary for other counseling recommendations.   Return for LOB, Midwife preferred.  Future Appointments  Date Time Provider Department Center  12/02/2023 10:35 AM Adam Phenix, MD [facility] None     Sharen Counter, CNM

## 2023-11-27 LAB — CERVICOVAGINAL ANCILLARY ONLY
Chlamydia: NEGATIVE
Comment: NEGATIVE
Comment: NORMAL
Neisseria Gonorrhea: NEGATIVE

## 2023-11-28 ENCOUNTER — Inpatient Hospital Stay (HOSPITAL_COMMUNITY)
Admission: AD | Admit: 2023-11-28 | Discharge: 2023-11-29 | Disposition: A | Discharge status: Home / Self Care | Payer: Medicaid Other | Attending: Obstetrics & Gynecology | Admitting: Obstetrics & Gynecology

## 2023-11-28 ENCOUNTER — Encounter (HOSPITAL_COMMUNITY): Payer: Self-pay | Admitting: Obstetrics & Gynecology

## 2023-11-28 DIAGNOSIS — Z3A36 36 weeks gestation of pregnancy: Secondary | ICD-10-CM | POA: Insufficient documentation

## 2023-11-28 DIAGNOSIS — O4703 False labor before 37 completed weeks of gestation, third trimester: Secondary | ICD-10-CM | POA: Insufficient documentation

## 2023-11-28 DIAGNOSIS — O479 False labor, unspecified: Secondary | ICD-10-CM

## 2023-11-28 NOTE — MAU Note (Signed)
.  Andrea Archer is a 21 y.o. at [redacted]w[redacted]d here in MAU reporting:  Ctxs all day but they slowly started to become worse and started feeling more pressure.  No lof, No bleeding, +FM.   Pain score: 10/10 lower abdominal pain. 10/10 lower back.   Vitals:   11/28/23 2347  BP: 130/87  Pulse: (!) 105  Resp: 17  Temp: 99 F (37.2 C)  SpO2: 100%    FHT:135 Lab orders placed from triage: Labor eval

## 2023-11-29 DIAGNOSIS — O4703 False labor before 37 completed weeks of gestation, third trimester: Secondary | ICD-10-CM | POA: Diagnosis present

## 2023-11-29 DIAGNOSIS — Z3A36 36 weeks gestation of pregnancy: Secondary | ICD-10-CM | POA: Diagnosis not present

## 2023-11-30 LAB — CULTURE, BETA STREP (GROUP B ONLY): Strep Gp B Culture: NEGATIVE

## 2023-12-02 ENCOUNTER — Encounter: Payer: Medicaid Other | Admitting: Obstetrics & Gynecology

## 2023-12-02 ENCOUNTER — Ambulatory Visit: Payer: Medicaid Other | Admitting: Obstetrics & Gynecology

## 2023-12-02 VITALS — BP 116/83 | HR 86 | Wt 201.0 lb

## 2023-12-02 DIAGNOSIS — Z3A37 37 weeks gestation of pregnancy: Secondary | ICD-10-CM | POA: Diagnosis not present

## 2023-12-02 DIAGNOSIS — Z1339 Encounter for screening examination for other mental health and behavioral disorders: Secondary | ICD-10-CM

## 2023-12-02 DIAGNOSIS — Z3483 Encounter for supervision of other normal pregnancy, third trimester: Secondary | ICD-10-CM

## 2023-12-02 DIAGNOSIS — Z3480 Encounter for supervision of other normal pregnancy, unspecified trimester: Secondary | ICD-10-CM

## 2023-12-02 MED ORDER — ONDANSETRON HCL 4 MG PO TABS: 4.0000 mg | ORAL_TABLET | Freq: Three times a day (TID) | ORAL | 0 refills | Status: DC | PRN

## 2023-12-02 NOTE — Progress Notes (Addendum)
ROB, c/o pain, contractions 10/10 x 5 days, nausea, diarrhea

## 2023-12-02 NOTE — Progress Notes (Signed)
   PRENATAL VISIT NOTE  Subjective:  Andrea Archer is a 22 y.o. G3P1011 at [redacted]w[redacted]d being seen today for ongoing prenatal care.  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  Patient reports nausea, vomiting, and diarrhea .  Contractions: Irregular. Vag. Bleeding: None.  Movement: Present. Denies leaking of fluid.   The following portions of the patient's history were reviewed and updated as appropriate: allergies, current medications, past family history, past medical history, past social history, past surgical history and problem list.   Objective:   Vitals:   12/02/23 1116  BP: 116/83  Pulse: 86  Weight: 201 lb (91.2 kg)    Fetal Status: Fetal Heart Rate (bpm): 144   Movement: Present  Presentation: Vertex  General:  Alert, oriented and cooperative. Patient is in no acute distress.  Skin: Skin is warm and dry. No rash noted.   Cardiovascular: Normal heart rate noted  Respiratory: Normal respiratory effort, no problems with respiration noted  Abdomen: Soft, gravid, appropriate for gestational age.  Pain/Pressure: Present     Pelvic: Cervical exam performed in the presence of a chaperone Dilation: Fingertip Effacement (%): 40 Station: Ballotable  Extremities: Normal range of motion.  Edema: Trace  Mental Status: Normal mood and affect. Normal behavior. Normal judgment and thought content.   Assessment and Plan:  Pregnancy: G3P1011 at [redacted]w[redacted]d 1. Supervision of other normal pregnancy, antepartum GI sx, no reflux - ondansetron (ZOFRAN) 4 MG tablet; Take 1 tablet (4 mg total) by mouth every 8 (eight) hours as needed for nausea or vomiting.   Dispense: 20 tablet; Refill: 0  Term labor symptoms and general obstetric precautions including but not limited to vaginal bleeding, contractions, leaking of fluid and fetal movement were reviewed in detail with the patient. Please refer to After Visit Summary for other counseling recommendations.   Return in about 1 week (around 12/09/2023).  No future appointments.  Andrea Darter, MD

## 2023-12-05 ENCOUNTER — Encounter: Payer: Medicaid Other | Admitting: Obstetrics & Gynecology

## 2023-12-10 ENCOUNTER — Ambulatory Visit (INDEPENDENT_AMBULATORY_CARE_PROVIDER_SITE_OTHER): Payer: Medicaid Other | Admitting: Obstetrics

## 2023-12-10 VITALS — BP 131/74 | HR 101 | Wt 205.6 lb

## 2023-12-10 DIAGNOSIS — Z3483 Encounter for supervision of other normal pregnancy, third trimester: Secondary | ICD-10-CM

## 2023-12-10 DIAGNOSIS — Z3A38 38 weeks gestation of pregnancy: Secondary | ICD-10-CM | POA: Diagnosis not present

## 2023-12-10 DIAGNOSIS — Z3480 Encounter for supervision of other normal pregnancy, unspecified trimester: Secondary | ICD-10-CM

## 2023-12-10 NOTE — Progress Notes (Signed)
Pt presents for ROB visit. Requesting cervical check. 

## 2023-12-10 NOTE — Progress Notes (Signed)
Subjective:  Andrea Archer is a 22 y.o. G3P1011 at [redacted]w[redacted]d being seen today for ongoing prenatal care.  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  Patient reports occasional contractions.  Contractions: Irritability. Vag. Bleeding: None.  Movement: Present. Denies leaking of fluid.   The following portions of the patient's history were reviewed and updated as appropriate: allergies, current medications, past family history, past medical history, past social history, past surgical history and problem list. Problem list updated.  Objective:   Vitals:   12/10/23 1106  Weight: 205 lb 9.6 oz (93.3 kg)    Fetal Status:     Movement: Present     General:  Alert, oriented and cooperative. Patient is in no acute distress.  Skin: Skin is warm and dry. No rash noted.   Cardiovascular: Normal heart rate noted  Respiratory: Normal respiratory effort, no problems with respiration noted  Abdomen: Soft, gravid, appropriate for gestational age. Pain/Pressure: Present     Pelvic:  Cervical exam deferred        Extremities: Normal range of motion.  Edema: None  Mental Status: Normal mood and affect. Normal behavior. Normal judgment and thought content.   Urinalysis:      Assessment and Plan:  Pregnancy: G3P1011 at [redacted]w[redacted]d  1. Supervision of other normal pregnancy, antepartum - doing well  Term labor symptoms and general obstetric precautions including but not limited to vaginal bleeding, contractions, leaking of fluid and fetal movement were reviewed in detail with the patient. Please refer to After Visit Summary for other counseling recommendations.    Return in about 1 week (around 12/17/2023) for ROB.   Brock Bad, MD 12/10/2023

## 2023-12-12 ENCOUNTER — Other Ambulatory Visit: Payer: Self-pay

## 2023-12-16 ENCOUNTER — Ambulatory Visit (INDEPENDENT_AMBULATORY_CARE_PROVIDER_SITE_OTHER): Payer: Medicaid Other | Admitting: Obstetrics

## 2023-12-16 ENCOUNTER — Encounter: Payer: Self-pay | Admitting: Obstetrics

## 2023-12-16 VITALS — BP 121/78 | HR 87 | Wt 210.0 lb

## 2023-12-16 DIAGNOSIS — Z8619 Personal history of other infectious and parasitic diseases: Secondary | ICD-10-CM

## 2023-12-16 DIAGNOSIS — Z3480 Encounter for supervision of other normal pregnancy, unspecified trimester: Secondary | ICD-10-CM

## 2023-12-16 DIAGNOSIS — Z8759 Personal history of other complications of pregnancy, childbirth and the puerperium: Secondary | ICD-10-CM

## 2023-12-16 NOTE — Progress Notes (Signed)
Pt reports fetal movement with irregular contractions. 

## 2023-12-16 NOTE — Progress Notes (Signed)
Subjective:  Andrea Archer is a 22 y.o. G3P1011 at [redacted]w[redacted]d being seen today for ongoing prenatal care.  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  Patient reports no leaking and occasional contractions.   .  .   . Denies leaking of fluid.   The following portions of the patient's history were reviewed and updated as appropriate: allergies, current medications, past family history, past medical history, past social history, past surgical history and problem list. Problem list updated.  Objective:  There were no vitals filed for this visit.  Fetal Status:           General:  Alert, oriented and cooperative. Patient is in no acute distress.  Skin: Skin is warm and dry. No rash noted.   Cardiovascular: Normal heart rate noted  Respiratory: Normal respiratory effort, no problems with respiration noted  Abdomen: Soft, gravid, appropriate for gestational age.       Pelvic:  Cervical exam performed      2cm/ 50%/ -2/ Vtx  Extremities: Normal range of motion.     Mental Status: Normal mood and affect. Normal behavior. Normal judgment and thought content.   Urinalysis:      Assessment and Plan:  Pregnancy: G3P1011 at [redacted]w[redacted]d  There are no diagnoses linked to this encounter. Term labor symptoms and general obstetric precautions including but not limited to vaginal bleeding, contractions, leaking of fluid and fetal movement were reviewed in detail with the patient. Please refer to After Visit Summary for other counseling recommendations.   Return in about 1 week (around 12/23/2023) for ROB.   Brock Bad, MD 12/16/2023

## 2023-12-17 ENCOUNTER — Inpatient Hospital Stay (HOSPITAL_COMMUNITY)
Admission: AD | Admit: 2023-12-17 | Discharge: 2023-12-18 | DRG: 806 | Disposition: A | Discharge status: Home / Self Care | Payer: Medicaid Other | Attending: Obstetrics and Gynecology | Admitting: Obstetrics and Gynecology

## 2023-12-17 ENCOUNTER — Inpatient Hospital Stay (HOSPITAL_COMMUNITY): Payer: Medicaid Other | Admitting: Anesthesiology

## 2023-12-17 ENCOUNTER — Other Ambulatory Visit: Payer: Self-pay

## 2023-12-17 ENCOUNTER — Encounter (HOSPITAL_COMMUNITY): Payer: Self-pay | Admitting: Obstetrics and Gynecology

## 2023-12-17 DIAGNOSIS — O479 False labor, unspecified: Principal | ICD-10-CM

## 2023-12-17 DIAGNOSIS — Z87891 Personal history of nicotine dependence: Secondary | ICD-10-CM | POA: Diagnosis not present

## 2023-12-17 DIAGNOSIS — Z3A39 39 weeks gestation of pregnancy: Secondary | ICD-10-CM

## 2023-12-17 DIAGNOSIS — Z832 Family history of diseases of the blood and blood-forming organs and certain disorders involving the immune mechanism: Secondary | ICD-10-CM | POA: Diagnosis not present

## 2023-12-17 DIAGNOSIS — O9902 Anemia complicating childbirth: Secondary | ICD-10-CM | POA: Diagnosis present

## 2023-12-17 DIAGNOSIS — D573 Sickle-cell trait: Secondary | ICD-10-CM | POA: Diagnosis present

## 2023-12-17 DIAGNOSIS — Z8249 Family history of ischemic heart disease and other diseases of the circulatory system: Secondary | ICD-10-CM

## 2023-12-17 DIAGNOSIS — O9882 Other maternal infectious and parasitic diseases complicating childbirth: Secondary | ICD-10-CM | POA: Diagnosis not present

## 2023-12-17 DIAGNOSIS — Z3480 Encounter for supervision of other normal pregnancy, unspecified trimester: Secondary | ICD-10-CM

## 2023-12-17 DIAGNOSIS — O2300 Infections of kidney in pregnancy, unspecified trimester: Secondary | ICD-10-CM | POA: Diagnosis present

## 2023-12-17 DIAGNOSIS — O4202 Full-term premature rupture of membranes, onset of labor within 24 hours of rupture: Secondary | ICD-10-CM | POA: Diagnosis not present

## 2023-12-17 DIAGNOSIS — O09899 Supervision of other high risk pregnancies, unspecified trimester: Secondary | ICD-10-CM

## 2023-12-17 DIAGNOSIS — O9832 Other infections with a predominantly sexual mode of transmission complicating childbirth: Secondary | ICD-10-CM | POA: Diagnosis present

## 2023-12-17 DIAGNOSIS — A6000 Herpesviral infection of urogenital system, unspecified: Secondary | ICD-10-CM | POA: Diagnosis present

## 2023-12-17 DIAGNOSIS — A6004 Herpesviral vulvovaginitis: Secondary | ICD-10-CM | POA: Diagnosis present

## 2023-12-17 DIAGNOSIS — O26893 Other specified pregnancy related conditions, third trimester: Secondary | ICD-10-CM | POA: Diagnosis present

## 2023-12-17 LAB — CBC
HCT: 34.2 % — ABNORMAL LOW (ref 36.0–46.0)
Hemoglobin: 11.3 g/dL — ABNORMAL LOW (ref 12.0–15.0)
MCH: 26.2 pg (ref 26.0–34.0)
MCHC: 33 g/dL (ref 30.0–36.0)
MCV: 79.2 fL — ABNORMAL LOW (ref 80.0–100.0)
Platelets: 266 10*3/uL (ref 150–400)
RBC: 4.32 MIL/uL (ref 3.87–5.11)
RDW: 15 % (ref 11.5–15.5)
WBC: 13.3 10*3/uL — ABNORMAL HIGH (ref 4.0–10.5)
nRBC: 0.2 % (ref 0.0–0.2)

## 2023-12-17 LAB — TYPE AND SCREEN
ABO/RH(D): O POS
Antibody Screen: NEGATIVE

## 2023-12-17 LAB — POCT FERN TEST
POCT Fern Test: POSITIVE
POCT Fern Test: POSITIVE

## 2023-12-17 LAB — RPR: RPR Ser Ql: NONREACTIVE

## 2023-12-17 MED ORDER — ONDANSETRON HCL 4 MG PO TABS: 4.0000 mg | ORAL_TABLET | ORAL | Status: DC | PRN

## 2023-12-17 MED ORDER — COCONUT OIL OIL: 1.0000 | TOPICAL_OIL | Status: DC | PRN

## 2023-12-17 MED ORDER — ONDANSETRON HCL 4 MG/2ML IJ SOLN
4.0000 mg | INTRAMUSCULAR | Status: DC | PRN
Start: 2023-12-17 — End: 2023-12-19

## 2023-12-17 MED ORDER — ONDANSETRON HCL 4 MG/2ML IJ SOLN
4.0000 mg | Freq: Four times a day (QID) | INTRAMUSCULAR | Status: DC | PRN
  Administered 2023-12-17: 4 mg via INTRAVENOUS
  Filled 2023-12-17: qty 2

## 2023-12-17 MED ORDER — WITCH HAZEL-GLYCERIN EX PADS: 1.0000 | MEDICATED_PAD | CUTANEOUS | Status: DC | PRN

## 2023-12-17 MED ORDER — LACTATED RINGERS IV SOLN: 500.0000 mL | Freq: Once | INTRAVENOUS | Status: DC

## 2023-12-17 MED ORDER — PHENYLEPHRINE 80 MCG/ML (10ML) SYRINGE FOR IV PUSH (FOR BLOOD PRESSURE SUPPORT): 80.0000 ug | PREFILLED_SYRINGE | INTRAVENOUS | Status: DC | PRN

## 2023-12-17 MED ORDER — LIDOCAINE HCL (PF) 1 % IJ SOLN: 30.0000 mL | INTRAMUSCULAR | Status: DC | PRN

## 2023-12-17 MED ORDER — EPHEDRINE 5 MG/ML INJ: 10.0000 mg | INTRAVENOUS | Status: DC | PRN

## 2023-12-17 MED ORDER — PRENATAL MULTIVITAMIN CH
1.0000 | ORAL_TABLET | Freq: Every day | ORAL | Status: DC
  Filled 2023-12-17 (×2): qty 1

## 2023-12-17 MED ORDER — OXYCODONE-ACETAMINOPHEN 5-325 MG PO TABS
1.0000 | ORAL_TABLET | ORAL | Status: DC | PRN
Start: 2023-12-17 — End: 2023-12-17

## 2023-12-17 MED ORDER — SIMETHICONE 80 MG PO CHEW: 80.0000 mg | CHEWABLE_TABLET | ORAL | Status: DC | PRN

## 2023-12-17 MED ORDER — OXYCODONE HCL 5 MG PO TABS: 5.0000 mg | ORAL_TABLET | ORAL | Status: DC | PRN

## 2023-12-17 MED ORDER — FENTANYL-BUPIVACAINE-NACL 0.5-0.125-0.9 MG/250ML-% EP SOLN
12.0000 mL/h | EPIDURAL | Status: DC | PRN
  Filled 2023-12-17: qty 250

## 2023-12-17 MED ORDER — ACETAMINOPHEN 325 MG PO TABS: 650.0000 mg | ORAL_TABLET | ORAL | Status: DC | PRN

## 2023-12-17 MED ORDER — DIPHENHYDRAMINE HCL 50 MG/ML IJ SOLN: 12.5000 mg | INTRAMUSCULAR | Status: DC | PRN

## 2023-12-17 MED ORDER — OXYTOCIN-SODIUM CHLORIDE 30-0.9 UT/500ML-% IV SOLN
2.5000 [IU]/h | INTRAVENOUS | Status: DC
  Filled 2023-12-17: qty 500

## 2023-12-17 MED ORDER — LACTATED RINGERS IV SOLN
500.0000 mL | INTRAVENOUS | Status: DC | PRN
  Administered 2023-12-17: 500 mL via INTRAVENOUS

## 2023-12-17 MED ORDER — OXYTOCIN BOLUS FROM INFUSION
333.0000 mL | Freq: Once | INTRAVENOUS | Status: AC
  Administered 2023-12-17: 333 mL via INTRAVENOUS

## 2023-12-17 MED ORDER — FENTANYL CITRATE (PF) 100 MCG/2ML IJ SOLN
100.0000 ug | INTRAMUSCULAR | Status: DC | PRN
  Administered 2023-12-17: 50 ug via INTRAVENOUS
  Filled 2023-12-17: qty 2

## 2023-12-17 MED ORDER — OXYCODONE-ACETAMINOPHEN 5-325 MG PO TABS
2.0000 | ORAL_TABLET | ORAL | Status: DC | PRN
Start: 2023-12-17 — End: 2023-12-17

## 2023-12-17 MED ORDER — HYDROXYZINE HCL 50 MG PO TABS: 50.0000 mg | ORAL_TABLET | Freq: Four times a day (QID) | ORAL | Status: DC | PRN

## 2023-12-17 MED ORDER — DIBUCAINE (PERIANAL) 1 % EX OINT: 1.0000 | TOPICAL_OINTMENT | CUTANEOUS | Status: DC | PRN

## 2023-12-17 MED ORDER — BENZOCAINE-MENTHOL 20-0.5 % EX AERO
1.0000 | INHALATION_SPRAY | CUTANEOUS | Status: DC | PRN
  Administered 2023-12-17: 1 via TOPICAL
  Filled 2023-12-17: qty 56

## 2023-12-17 MED ORDER — OXYCODONE HCL 5 MG PO TABS: 10.0000 mg | ORAL_TABLET | ORAL | Status: DC | PRN

## 2023-12-17 MED ORDER — FLEET ENEMA RE ENEM: 1.0000 | ENEMA | RECTAL | Status: DC | PRN

## 2023-12-17 MED ORDER — LACTATED RINGERS IV SOLN: INTRAVENOUS | Status: DC

## 2023-12-17 MED ORDER — DIPHENHYDRAMINE HCL 25 MG PO CAPS: 25.0000 mg | ORAL_CAPSULE | Freq: Four times a day (QID) | ORAL | Status: DC | PRN

## 2023-12-17 MED ORDER — ACETAMINOPHEN 325 MG PO TABS
650.0000 mg | ORAL_TABLET | ORAL | Status: DC | PRN
  Administered 2023-12-17 – 2023-12-18 (×5): 650 mg via ORAL
  Filled 2023-12-17 (×5): qty 2

## 2023-12-17 MED ORDER — IBUPROFEN 600 MG PO TABS
600.0000 mg | ORAL_TABLET | Freq: Four times a day (QID) | ORAL | Status: DC
  Filled 2023-12-17 (×3): qty 1

## 2023-12-17 MED ORDER — DOCUSATE SODIUM 100 MG PO CAPS
100.0000 mg | ORAL_CAPSULE | Freq: Two times a day (BID) | ORAL | Status: DC
  Administered 2023-12-17 – 2023-12-18 (×2): 100 mg via ORAL
  Filled 2023-12-17 (×2): qty 1

## 2023-12-17 MED ORDER — SOD CITRATE-CITRIC ACID 500-334 MG/5ML PO SOLN: 30.0000 mL | ORAL | Status: DC | PRN

## 2023-12-17 NOTE — MAU Note (Signed)
.  Tanitra Koehler is a 22 y.o. at [redacted]w[redacted]d here in MAU reporting:   SROM at 0011 Clear with some bloody show. Pt states she had a membrane sweep yesterday and has felt ctxs back to back.  +FM Desires a Systems developer.    Pain score: 9/10 lower abdominal ctxs  Vitals:   12/17/23 0130  BP: 134/82  Pulse: 90  Resp: 20  Temp: 97.7 F (36.5 C)    FHT:135 Lab orders placed from triage:   Leamon Arnt

## 2023-12-17 NOTE — Anesthesia Preprocedure Evaluation (Signed)
Anesthesia Evaluation  Patient identified by MRN, date of birth, ID band Patient awake    Reviewed: Allergy & Precautions, NPO status , Patient's Chart, lab work & pertinent test results  Airway        Dental   Pulmonary former smoker          Cardiovascular hypertension,      Neuro/Psych    GI/Hepatic   Endo/Other    Renal/GU Lab Results      Component                Value               Date                      WBC                      13.3 (H)            12/17/2023                HGB                      11.3 (L)            12/17/2023                HCT                      34.2 (L)            12/17/2023                MCV                      79.2 (L)            12/17/2023                PLT                      266                 12/17/2023                Musculoskeletal   Abdominal   Peds  Hematology Lab Results      Component                Value               Date                      WBC                      13.3 (H)            12/17/2023                HGB                      11.3 (L)            12/17/2023                HCT                      34.2 (L)  12/17/2023                MCV                      79.2 (L)            12/17/2023                PLT                      266                 12/17/2023              Anesthesia Other Findings   Reproductive/Obstetrics                              Anesthesia Physical Anesthesia Plan  ASA: 3  Anesthesia Plan: Epidural   Post-op Pain Management:    Induction:   PONV Risk Score and Plan:   Airway Management Planned:   Additional Equipment:   Intra-op Plan:   Post-operative Plan:   Informed Consent: I have reviewed the patients History and Physical, chart, labs and discussed the procedure including the risks, benefits and alternatives for the proposed anesthesia with the patient or authorized  representative who has indicated his/her understanding and acceptance.       Plan Discussed with:   Anesthesia Plan Comments: (39.4 G3p1 w pIH for LEA)         Anesthesia Quick Evaluation

## 2023-12-17 NOTE — H&P (Addendum)
OBSTETRIC ADMISSION HISTORY AND PHYSICAL  Andrea Archer is a 22 y.o. female G3P1011 with IUP at [redacted]w[redacted]d by LMP confirmed by [place] presenting for SROM and SOL. She reports +FMs, No LOF, no VB, no blurry vision, headaches or peripheral edema, and RUQ pain.  She plans on breast and bottle feeding. She request unsure for birth control. She received her prenatal care at  [facility]    Dating: By LMP confirmed with 6wk [place] --->  Estimated Date of Delivery: 12/20/23  Sono:    @[redacted]w[redacted]d , CWD, normal anatomy, cephalic presentation, anterior placental lie, 2344g, 46% EFW   Prenatal History/Complications: gHTN, HSV vulvovaginitis, pyelonephritis  Past Medical History: Past Medical History:  Diagnosis Date   Anemia    Anxiety    Bacterial vaginitis    Depression    Hypertension    with pregnancy   Pregnant    Sickle cell trait (HCC)    UTI (urinary tract infection)     Past Surgical History: Past Surgical History:  Procedure Laterality Date   FINGER SURGERY Left    Pinky Finger   FRACTURE SURGERY  01/09/2019   left pinky finger    Obstetrical History: OB History     Gravida  3   Para  1   Term  1   Preterm  0   AB  1   Living  1      SAB  1   IAB  0   Ectopic  0   Multiple  0   Live Births  1           Social History Social History   Socioeconomic History   Marital status: Single    Spouse name: Not on file   Number of children: Not on file   Years of education: Not on file   Highest education level: Not on file  Occupational History   Not on file  Tobacco Use   Smoking status: Former    Types: Cigars    Passive exposure: Past   Smokeless tobacco: Never   Tobacco comments:    Quit 2022  Vaping Use   Vaping status: Never Used  Substance and Sexual Activity   Alcohol use: Not Currently    Comment: occ, not since confirmed pregnancy   Drug use: No   Sexual activity: Yes    Partners: Male    Birth control/protection: None  Other Topics Concern   Not  on file  Social History Narrative   Not on file   Social Drivers of Health   Financial Resource Strain: Not on File (04/19/2022)   Received from [facility], [employer] [facility]    Financial Resource Strain: 0  Food Insecurity: Not on File (09/26/2023)   Received from [facility] Insecurity    Food: 0  Transportation Needs: No Transportation Needs (09/05/2023)   PRAPARE - Administrator, Civil Service (Medical): No    Lack of Transportation (Non-Medical): No  Physical Activity: Not on File (04/19/2022)   Received from [place], [place]   Physical Activity    Physical Activity: 0  Stress: Not on File (04/19/2022)   Received from [facility], [place]   Stress    Stress: 0  Social Connections: Not on File (09/20/2023)   Received from [facility]   Social Connections    Connectedness: 0    Family History: Family History  Problem Relation Age of Onset   Cancer Mother        cervical  ca 2023   Hypertension Mother    Hypertension Father    Archer disease Father    Sickle cell anemia Father    Sickle cell trait Father        has sickle cell   Diabetes Maternal Grandmother    Hypertension Maternal Grandmother    Archer disease Maternal Grandmother    Fibromyalgia Maternal Grandmother    Hypertension Maternal Grandfather    Pancreatic cancer Maternal Grandfather    Hypertension Paternal Grandmother    Hypertension Paternal Grandfather    Colon cancer Neg Hx    Stomach cancer Neg Hx    Esophageal cancer Neg Hx    Rectal cancer Neg Hx     Allergies: Allergies  Allergen Reactions   Other     Henna - rash   Pineapple Rash    Medications Prior to Admission  Medication Sig Dispense Refill Last Dose/Taking   Prenatal Vit-Fe Phos-FA-Omega (VITAFOL GUMMIES) 3.33-0.333-34.8 MG CHEW Chew 1 tablet by mouth daily. 90 tablet 5 12/16/2023   valACYclovir (VALTREX) 500 MG tablet Take 1 tablet (500 mg total) by mouth 2 (two) times daily. 60 tablet 1 12/16/2023   Blood Pressure  Monitoring (BLOOD PRESSURE KIT) DEVI 1 kit by Does not apply route once a week. (Patient not taking: Reported on 12/16/2023) 1 each 0    cyclobenzaprine (FLEXERIL) 10 MG tablet Take 1 tablet (10 mg total) by mouth 3 (three) times daily as needed for muscle spasms. (Patient not taking: Reported on 12/16/2023) 30 tablet 0    Magnesium 200 MG TABS Take 2 tablets (400 mg total) by mouth at bedtime. (Patient not taking: Reported on 11/06/2023) 30 tablet 1    ondansetron (ZOFRAN) 4 MG tablet Take 1 tablet (4 mg total) by mouth every 8 (eight) hours as needed for nausea or vomiting. (Patient not taking: Reported on 12/16/2023) 20 tablet 0    valACYclovir (VALTREX) 500 MG tablet Take 500 mg by mouth 2 (two) times daily. (Patient not taking: Reported on 12/16/2023)        Review of Systems   All systems reviewed and negative except as stated in HPI  Blood pressure 134/82, pulse 90, temperature 97.7 F (36.5 C), temperature source Oral, resp. rate 20, height 5\' 6"  (1.676 m), weight 95.3 kg, last menstrual period 03/15/2023. General appearance: alert, cooperative, and no distress Lungs: non labored breathing Presentation: cephalic Fetal monitoringBaseline: 145 bpm, Variability: Good {> 6 bpm), Accelerations: Reactive, and Decelerations: Absent Uterine activity irregular Dilation: 2 Effacement (%): 50 Station: -2 Exam by:: Ginnie Smart, RN   Prenatal labs: ABO, Rh: --/--/O POS (09/05 1950) Antibody: NEG (09/05 1950) Rubella: <0.90 (05/10 1105) RPR: Non Reactive (09/26 0916)  HBsAg: Negative (05/10 1105)  HIV: Non Reactive (09/26 0916)  GBS: Negative/-- (11/26 1430)  2 hr Glucola 99 Genetic screening  low risk NIPS, neg AFP Anatomy US WNL, f/u US WNL  Prenatal Transfer Tool  Maternal Diabetes: No Genetic Screening: Normal Maternal Ultrasounds/Referrals: Normal Fetal Ultrasounds or other Referrals:  Referred to Materal Fetal Medicine due to hx of gHTN, repeat US WNL Maternal Substance Abuse:   No Significant Maternal Medications:  Meds include: Other: valtrex Significant Maternal Lab Results:  Group B Strep negative Number of Prenatal Visits:greater than 3 verified prenatal visits Other Comments:  None  Results for orders placed or performed during the hospital encounter of 12/17/23 (from the past 24 hours)  POCT fern test   Collection Time: 12/17/23  1:23 AM  Result Value Ref Range  POCT Fern Test Positive = ruptured amniotic membanes   Fern Test   Collection Time: 12/17/23  1:49 AM  Result Value Ref Range   POCT Fern Test Positive = ruptured amniotic membanes     Patient Active Problem List   Diagnosis Date Noted   Herpes simplex vulvovaginitis 11/26/2023   History of gestational hypertension 09/05/2023   Pyelonephritis affecting pregnancy 09/05/2023   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 G3P1011 at [redacted]w[redacted]d here for SROM and SOL  #Labor: expectant management #Hx of gHTN: borderline pressures, will continue monitor blood pressures and tx as indicated #Pain: Desires waterbirth #FWB: Cat 1 #ID:  GBS negative #MOF: breast and bottle #MOC: undecided #Circ:  Yes if boy  Lorayne Bender, MD  12/17/2023, 1:50 AM   GME ATTESTATION:  Evaluation and management procedures were performed by the [facility] Medicine Resident under my supervision. I was immediately available for direct supervision, assistance and direction throughout this encounter.  I also confirm that I have verified the information documented in the resident's note, and that I have also personally reperformed the pertinent components of the physical exam and all of the medical decision making activities.  I have also made any necessary editorial changes.  Wyn Forster,  MD OB Fellow, Faculty Practice [facility], Center for [facility] 12/17/2023 9:46 AM

## 2023-12-17 NOTE — MAU Note (Addendum)

## 2023-12-17 NOTE — Discharge Summary (Signed)
Postpartum Discharge Summary  Date of Service updated      Patient Name: Andrea Archer DOB: [birth year] MRN: [id number]  Date of admission: 12/17/2023 Delivery date:12/17/2023 Delivering provider: Federico Flake Date of discharge: 12/18/2023  Admitting diagnosis: Indication for care in labor or delivery [O75.9] Intrauterine pregnancy: [redacted]w[redacted]d     Secondary diagnosis:  Principal Problem:   SVD (spontaneous vaginal delivery) Active Problems:   Sickle cell trait (HCC)   Supervision of other normal pregnancy, antepartum   Rubella non-immune status, antepartum   Pyelonephritis affecting pregnancy   Herpes simplex vulvovaginitis  Additional problems: n/A     Discharge diagnosis: Term Pregnancy Delivered                                              Post partum procedures: n/a Augmentation: N/A Complications: None  Hospital course: Onset of Labor With Vaginal Delivery      22 y.o. yo [id number] at [redacted]w[redacted]d was admitted in Active Labor on 12/17/2023. Labor course was uncomplicated  Membrane Rupture Time/Date: 12:11 AM,12/17/2023  Delivery Method:Vaginal, Spontaneous Operative Delivery:N/A Episiotomy: None Lacerations:  1st degree Patient had a postpartum course complicated by N/A.  She is ambulating, tolerating a regular diet, passing flatus, and urinating well. Patient is discharged home in stable condition on 12/18/23.  Newborn Data: Birth date:12/17/2023 Birth time:10:53 AM Gender:Female Living status:Living Apgars:9 ,9  Weight:3390 g  Magnesium Sulfate received: No BMZ received: No Rhophylac:N/A MMR:N/A T-DaP:Given prenatally Flu: Yes RSV Vaccine received: No Transfusion:No Immunizations administered: Immunization History  Administered Date(s) Administered   Influenza, Seasonal, Injecte, Preservative Fre 09/26/2023   Influenza,inj,Quad PF,6+ Mos 10/19/2019   Tdap 10/19/2019, 09/26/2023    Physical exam  Vitals:   12/17/23 2352 12/18/23 0300 12/18/23 0539  12/18/23 1358  BP: 126/69 126/64 (!) 107/52 109/60  Pulse: 79 77 83 78  Resp: 18 18 18 18   Temp: 98.5 F (36.9 C) 98.5 F (36.9 C)  98.7 F (37.1 C)  TempSrc:    Oral  SpO2: 100% 100%  100%  Weight:      Height:       General: alert, cooperative, and no distress Lochia: appropriate Uterine Fundus: firm Incision: N/A DVT Evaluation: No evidence of DVT seen on physical exam. No significant calf/ankle edema. Labs: Lab Results  Component Value Date   WBC 16.9 (H) 12/18/2023   HGB 10.6 (L) 12/18/2023   HCT 31.4 (L) 12/18/2023   MCV 78.3 (L) 12/18/2023   PLT 228 12/18/2023      Latest Ref Rng & Units 11/06/2023    2:13 AM  CMP  Glucose 70 - 99 mg/dL 147   BUN 6 - 20 mg/dL 8   Creatinine 8.29 - 5.62 mg/dL 1.30   Sodium 865 - 784 mmol/L 132   Potassium 3.5 - 5.1 mmol/L 3.6   Chloride 98 - 111 mmol/L 104   CO2 22 - 32 mmol/L 21   Calcium 8.9 - 10.3 mg/dL 8.7   Total Protein 6.5 - 8.1 g/dL 5.7   Total Bilirubin <6.9 mg/dL 0.3   Alkaline Phos 38 - 126 U/L 115   AST 15 - 41 U/L 18   ALT 0 - 44 U/L 15    Edinburgh Score:    12/18/2023    3:03 PM  Edinburgh Postnatal Depression Scale Screening Tool  I have been able to laugh and  see the funny side of things. 0  I have looked forward with enjoyment to things. 0  I have blamed myself unnecessarily when things went wrong. 0  I have been anxious or worried for no good reason. 0  I have felt scared or panicky for no good reason. 0  Things have been getting on top of me. 0  I have been so unhappy that I have had difficulty sleeping. 0  I have felt sad or miserable. 1  I have been so unhappy that I have been crying. 1  The thought of harming myself has occurred to me. 0  Edinburgh Postnatal Depression Scale Total 2      After visit meds:  Allergies as of 12/18/2023       Reactions   Other    Henna - rash   Pineapple Rash        Medication List     TAKE these medications    acetaminophen 325 MG  tablet Commonly known as: Tylenol Take 2 tablets (650 mg total) by mouth every 4 (four) hours as needed (for pain scale < 4).   Blood Pressure Kit Devi 1 kit by Does not apply route once a week.   cyclobenzaprine 10 MG tablet Commonly known as: FLEXERIL Take 1 tablet (10 mg total) by mouth 3 (three) times daily as needed for muscle spasms.   ibuprofen 600 MG tablet Commonly known as: ADVIL Take 1 tablet (600 mg total) by mouth every 6 (six) hours.   Magnesium 200 MG Tabs Take 2 tablets (400 mg total) by mouth at bedtime.   ondansetron 4 MG tablet Commonly known as: Zofran Take 1 tablet (4 mg total) by mouth every 8 (eight) hours as needed for nausea or vomiting.   valACYclovir 500 MG tablet Commonly known as: VALTREX Take 500 mg by mouth 2 (two) times daily.   valACYclovir 500 MG tablet Commonly known as: Valtrex Take 1 tablet (500 mg total) by mouth 2 (two) times daily.   Vitafol Gummies 3.33-0.333-34.8 MG Chew Chew 1 tablet by mouth daily.         Discharge home in stable condition Infant Feeding: Bottle and Breast Infant Disposition:home with mother Discharge instruction: per After Visit Summary and Postpartum booklet. Activity: Advance as tolerated. Pelvic rest for 6 weeks.  Diet: routine diet Anticipated Birth Control: Condoms Postpartum Appointment:4 weeks Additional Postpartum F/U:  n/a  Future Appointments: Future Appointments  Date Time Provider Department Center  01/28/2024 10:15 AM Brock Bad, MD [facility] None   Follow up Visit:   Follow up Visit: Message sent to [facility] 12/18  Please schedule this patient for a In person postpartum visit in 4 weeks with the following provider: Any provider. Additional Postpartum F/U:None  Low risk pregnancy complicated by: previous hx of ghtn  Delivery mode:  Vaginal, Spontaneous Anticipated Birth Control:  Condoms     12/18/2023 Claudette Head, CNM

## 2023-12-17 NOTE — Lactation Note (Addendum)
This note was copied from a baby's chart. Lactation Consultation Note  Patient Name: Andrea Archer [id number] Date: 12/17/2023 Age:24 hours Reason for consult: Initial assessment;Term  P2, 39 wks, @ 6 hrs of life. Mom experienced breast feeder-  Per mom baby latching well. Encouraged mom that milk transitions easier/faster with 2nd baby, more cramping- mom shares she is cramping with latching. Highlighted sleepy 1st day baby, more awake/cueing 2nd day with cluster feeding overnights. Encouraged baby belly size of spoon on day 1, can hand express onto spoon and feed off spoon as a meal if baby too tired to come to breast. Mom has requested pump, but still in process of approval/pre-shipping. LC services and milk storage shared with mom. Encouraged mom she could request LC services as desired.  Maternal Data Does the patient have breastfeeding experience prior to this delivery?: Yes  Feeding Mother's Current Feeding Choice: Breast Milk  Lactation Tools Discussed/Used Tools: Pump Breast pump type: Manual  I Discharge Pump: Hands Free;Personal (Mom has requested pump- waiting on OB approval to keep process in motion)  Consult Status Consult Status: Follow-up Date: 12/17/23 Follow-up type: In-patient    [facility] 12/17/2023, 5:01 PM

## 2023-12-17 NOTE — Progress Notes (Signed)
Called to room because patient was 8cm and in transition. They were filling the tub. When I arrive patient was on standing next to the bed. Painful contractions and complaining of vaginal pressure. Tub was full and patient transitioned to water immersion.   Federico Flake, MD

## 2023-12-17 NOTE — Progress Notes (Signed)
Labor Progress Note Andrea Archer is a 22 y.o. G3P1011 at [redacted]w[redacted]d presented for SOL  S: painful contractions  O:  BP 132/64 (BP Location: Left Arm)   Pulse 81   Temp 98.4 F (36.9 C) (Oral)   Resp 16   Ht 5\' 6"  (1.676 m)   Wt 95.3 kg   LMP 03/15/2023 (Exact Date)   SpO2 100%   BMI 33.89 kg/m  EFM: 140/moderate/+accels, no decels  CVE: Dilation: 4.5 Effacement (%): 90 Cervical Position: Middle Station: -2 Presentation: Vertex Exam by:: Dr. Alvester Morin   A&P: 22 y.o. G3P1011 [redacted]w[redacted]d  SOL #Labor: Progressing well. AROM of forebag perform #Pain: movement, using shower now. Plans on immersion for waterbirth Consented for waterbirth. Signed consent. OK to immerse when ready. Encouraged her to move and likely pelvic tilts and rotational releases will help  #FWB: Cat 1, intermittent monitoring #GBS negative  Federico Flake, MD 8:53 AM

## 2023-12-18 ENCOUNTER — Encounter (HOSPITAL_COMMUNITY): Payer: Self-pay | Admitting: Obstetrics & Gynecology

## 2023-12-18 LAB — CBC
HCT: 31.4 % — ABNORMAL LOW (ref 36.0–46.0)
Hemoglobin: 10.6 g/dL — ABNORMAL LOW (ref 12.0–15.0)
MCH: 26.4 pg (ref 26.0–34.0)
MCHC: 33.8 g/dL (ref 30.0–36.0)
MCV: 78.3 fL — ABNORMAL LOW (ref 80.0–100.0)
Platelets: 228 10*3/uL (ref 150–400)
RBC: 4.01 MIL/uL (ref 3.87–5.11)
RDW: 15 % (ref 11.5–15.5)
WBC: 16.9 10*3/uL — ABNORMAL HIGH (ref 4.0–10.5)
nRBC: 0.1 % (ref 0.0–0.2)

## 2023-12-18 MED ORDER — IBUPROFEN 600 MG PO TABS: 600.0000 mg | ORAL_TABLET | Freq: Four times a day (QID) | ORAL | 0 refills | Status: DC

## 2023-12-18 MED ORDER — ACETAMINOPHEN 325 MG PO TABS: 650.0000 mg | ORAL_TABLET | ORAL | 0 refills | Status: DC | PRN

## 2023-12-18 NOTE — Lactation Note (Signed)
This note was copied from a baby's chart. Lactation Consultation Note  Patient Name: Andrea Archer Date: 12/18/2023 Age:24 hours Reason for consult: Follow-up assessment;Term;Infant weight loss;Breastfeeding assistance (2 % weight loss) As LC entered the room , per mom had recently fed milk she had expressed with the bottle.  Mom also mentioned she was having some nipple soreness and requested for this LC to check. No breakdown noted, just areola edema.  LC recommended a dab of coconut oil and the reverse pressure especially prior to latching.  For the Latch - worked on proper support, football position and waiting for the baby to open wide and latch.  Swallows noted and increased with breast compressions and warm moist compress on the breast while feeding. Per mom more comfortable.  Shells provided for between feedings.    Maternal Data Has patient been taught Hand Expression?: Yes  Feeding Mother's Current Feeding Choice: Breast Milk Nipple Type: Slow - flow  LATCH Score Latch: Grasps breast easily, tongue down, lips flanged, rhythmical sucking.  Audible Swallowing: Spontaneous and intermittent  Type of Nipple: Everted at rest and after stimulation  Comfort (Breast/Nipple): Soft / non-tender  Hold (Positioning): Assistance needed to correctly position infant at breast and maintain latch.  LATCH Score: 9   Lactation Tools Discussed/Used Tools: Pump Breast pump type: Manual Pump Education: Milk Storage;Setup, frequency, and cleaning;Other (comment) (mom had already been set up) Shells  Interventions Interventions: Breast feeding basics reviewed;Assisted with latch;Skin to skin;Breast massage;Hand express;Reverse pressure;Breast compression;Adjust position;Support pillows;Position options;Pre-pump if needed;Hand pump;Education;LC Services brochure;CDC Guidelines for Breast Pump Cleaning  Discharge Pump: Personal;Manual;Hands Free  Consult Status Consult  Status: Follow-up Date: 12/19/23 Follow-up type: In-patient    Andrea Archer 12/18/2023, 12:10 PM

## 2023-12-18 NOTE — Progress Notes (Signed)
Post Partum Day 1 Subjective: no complaints, up ad lib, voiding, tolerating PO, and + flatus  Objective: Blood pressure (!) 107/52, pulse 83, temperature 98.5 F (36.9 C), resp. rate 18, height 5\' 6"  (1.676 m), weight 95.3 kg, last menstrual period 03/15/2023, SpO2 100%, unknown if currently breastfeeding.  Physical Exam:  General: alert, cooperative, and appears stated age Lochia: appropriate Uterine Fundus: firm Incision: N/A DVT Evaluation: No evidence of DVT seen on physical exam. No significant calf/ankle edema.  Recent Labs    12/17/23 0201 12/18/23 0453  HGB 11.3* 10.6*  HCT 34.2* 31.4*    Assessment/Plan: Discharge home, Breastfeeding, Lactation consult, Circumcision prior to discharge, and Contraception plans for condoms     LOS: 1 day   Claudette Head, CNM 12/18/2023, 9:33 AM

## 2023-12-23 ENCOUNTER — Encounter: Payer: Medicaid Other | Admitting: Obstetrics and Gynecology

## 2023-12-24 ENCOUNTER — Telehealth (HOSPITAL_COMMUNITY): Payer: Self-pay | Admitting: *Deleted

## 2023-12-24 NOTE — Telephone Encounter (Signed)
12/24/2023  Name: Andrea Archer MRN: [id number] DOB: 11-06-2001  Reason for Call:  Transition of Care Hospital Discharge Call  Contact Status: Patient Contact Status: Complete  Language assistant needed:          Follow-Up Questions: Do You Have Any Concerns About Your Health As You Heal From Delivery?: Yes What Concerns Do You Have About Your Health?: Patient c/o "abdominal pain, blood clots, and (R) side back pain." Also reported headaches that occur with abdominal pain. Patient described abdominal pain as right-sided. Reported headaches cause "eye pain and they are sensitive to light." RN reviewed symptoms of pre-eclampsia with patient. Instructed patient to call OB when finished with this phone call to report her symptoms. Patient verbalized understanding. Also encouraged patient to make arrangements to come to [facility]U for evaluation. Patient stated that baby can "stay with his father." Patient concerned about picking up her oldest daughter from school. RN encouraged patient to call OB and to come to MAU. Patient stated she would come "after I finish what I'm doing." RN emphasized importance of evaluation. No other questions or concerns voiced at this time. Do You Have Any Concerns About Your Infants Health?: No  Edinburgh Postnatal Depression Scale:  In the Past 7 Days:   EPDS not completed at this time to facilitate patient's ability to contact her OB and to make arrangements to come to MAU. Patient stated, "I'm rocking this mommy thing!" PHQ2-9 Depression Scale:     Discharge Follow-up: Edinburgh score requires follow up?: N/A Patient was advised of the following resources:: Breastfeeding Support Group, Support Group Patient referred to:: OB, [facility]  Post-discharge interventions: Reviewed Newborn Safe Sleep Practices  Signature Deforest Hoyles, RN, 12/24/23, 1049

## 2023-12-24 NOTE — Telephone Encounter (Signed)
Attempted hospital discharge follow-up call. Left message for patient to return RN call with any questions or concerns. Deforest Hoyles, RN, 12/24/23, [phone]

## 2023-12-26 ENCOUNTER — Inpatient Hospital Stay (HOSPITAL_COMMUNITY)
Admission: AD | Admit: 2023-12-26 | Discharge: 2023-12-27 | Discharge status: Against Medical Advice | Payer: Medicaid Other | Attending: Obstetrics & Gynecology | Admitting: Obstetrics & Gynecology

## 2023-12-26 DIAGNOSIS — R519 Headache, unspecified: Secondary | ICD-10-CM | POA: Insufficient documentation

## 2023-12-26 DIAGNOSIS — O9089 Other complications of the puerperium, not elsewhere classified: Secondary | ICD-10-CM | POA: Insufficient documentation

## 2023-12-26 DIAGNOSIS — R0789 Other chest pain: Secondary | ICD-10-CM | POA: Insufficient documentation

## 2023-12-26 DIAGNOSIS — R109 Unspecified abdominal pain: Secondary | ICD-10-CM | POA: Insufficient documentation

## 2023-12-26 DIAGNOSIS — H53143 Visual discomfort, bilateral: Secondary | ICD-10-CM | POA: Insufficient documentation

## 2023-12-26 NOTE — MAU Note (Addendum)
.  Andrea Archer is a 22 y.o. at Unknown here in MAU reporting "weird" headaches since delivery. No epidural. Today eyes have swollen and sensitive to light. Pain on R lateral side and goes to back. Some abdominal pain if press on stomach. Sharp pain lower sternum. Bleeding is light. Tylenol not helping h/a -last dose on Tues. Has not taken any b/ps at home.    Onset of complaint: several days Pain score: 9 for h/a Vitals:   12/26/23 2253 12/26/23 2255  BP:  118/71  Pulse: 84   Resp: 17   Temp: 98 F (36.7 C)   SpO2: 100%      FHT:n/a Lab orders placed from triage: none

## 2023-12-27 DIAGNOSIS — H53143 Visual discomfort, bilateral: Secondary | ICD-10-CM | POA: Diagnosis present

## 2023-12-27 DIAGNOSIS — R109 Unspecified abdominal pain: Secondary | ICD-10-CM | POA: Diagnosis present

## 2023-12-27 DIAGNOSIS — O9089 Other complications of the puerperium, not elsewhere classified: Secondary | ICD-10-CM | POA: Diagnosis not present

## 2023-12-27 DIAGNOSIS — R519 Headache, unspecified: Secondary | ICD-10-CM | POA: Diagnosis present

## 2023-12-27 DIAGNOSIS — R0789 Other chest pain: Secondary | ICD-10-CM | POA: Diagnosis not present

## 2023-12-27 NOTE — MAU Note (Signed)
Pt was asking how much longer before her blood was drawn. She states 'Wynona Canes' from office called her and told her to come in and have blood drawn and then she could leave. Explained no orders have been placed for blood work yet and our provider would need to see her to decide what to order. Pt states she will wait about mins. Told pt if she decides to leave to let registration know as she needs to sign a form before leaving

## 2023-12-27 NOTE — MAU Note (Signed)
 Pt left AMA and signed AMA form before leaving

## 2023-12-28 ENCOUNTER — Other Ambulatory Visit: Payer: Self-pay

## 2023-12-28 ENCOUNTER — Inpatient Hospital Stay (HOSPITAL_COMMUNITY): Admission: RE | Admit: 2023-12-28 | Payer: Medicaid Other | Source: Home / Self Care | Admitting: Family Medicine

## 2023-12-28 ENCOUNTER — Encounter (HOSPITAL_BASED_OUTPATIENT_CLINIC_OR_DEPARTMENT_OTHER): Payer: Self-pay

## 2023-12-28 ENCOUNTER — Inpatient Hospital Stay (HOSPITAL_COMMUNITY): Payer: Medicaid Other

## 2023-12-28 DIAGNOSIS — H53149 Visual discomfort, unspecified: Secondary | ICD-10-CM | POA: Insufficient documentation

## 2023-12-28 DIAGNOSIS — R519 Headache, unspecified: Secondary | ICD-10-CM | POA: Diagnosis present

## 2023-12-28 DIAGNOSIS — R39198 Other difficulties with micturition: Secondary | ICD-10-CM | POA: Diagnosis not present

## 2023-12-28 DIAGNOSIS — R109 Unspecified abdominal pain: Secondary | ICD-10-CM | POA: Diagnosis not present

## 2023-12-28 LAB — CBC WITH DIFFERENTIAL/PLATELET
Abs Immature Granulocytes: 0.02 10*3/uL (ref 0.00–0.07)
Basophils Absolute: 0.1 10*3/uL (ref 0.0–0.1)
Basophils Relative: 1 %
Eosinophils Absolute: 1 10*3/uL — ABNORMAL HIGH (ref 0.0–0.5)
Eosinophils Relative: 9 %
HCT: 38.3 % (ref 36.0–46.0)
Hemoglobin: 12.6 g/dL (ref 12.0–15.0)
Immature Granulocytes: 0 %
Lymphocytes Relative: 37 %
Lymphs Abs: 4 10*3/uL (ref 0.7–4.0)
MCH: 26 pg (ref 26.0–34.0)
MCHC: 32.9 g/dL (ref 30.0–36.0)
MCV: 79.1 fL — ABNORMAL LOW (ref 80.0–100.0)
Monocytes Absolute: 0.7 10*3/uL (ref 0.1–1.0)
Monocytes Relative: 7 %
Neutro Abs: 5 10*3/uL (ref 1.7–7.7)
Neutrophils Relative %: 46 %
Platelets: 359 10*3/uL (ref 150–400)
RBC: 4.84 MIL/uL (ref 3.87–5.11)
RDW: 15.5 % (ref 11.5–15.5)
WBC: 10.9 10*3/uL — ABNORMAL HIGH (ref 4.0–10.5)
nRBC: 0 % (ref 0.0–0.2)

## 2023-12-28 LAB — URINALYSIS, ROUTINE W REFLEX MICROSCOPIC
Bacteria, UA: NONE SEEN
Bilirubin Urine: NEGATIVE
Glucose, UA: NEGATIVE mg/dL
Ketones, ur: NEGATIVE mg/dL
Nitrite: NEGATIVE
Protein, ur: NEGATIVE mg/dL
Specific Gravity, Urine: 1.012 (ref 1.005–1.030)
pH: 5 (ref 5.0–8.0)

## 2023-12-28 LAB — COMPREHENSIVE METABOLIC PANEL
ALT: 25 U/L (ref 0–44)
AST: 22 U/L (ref 15–41)
Albumin: 4 g/dL (ref 3.5–5.0)
Alkaline Phosphatase: 89 U/L (ref 38–126)
Anion gap: 8 (ref 5–15)
BUN: 9 mg/dL (ref 6–20)
CO2: 26 mmol/L (ref 22–32)
Calcium: 9.1 mg/dL (ref 8.9–10.3)
Chloride: 105 mmol/L (ref 98–111)
Creatinine, Ser: 1.09 mg/dL — ABNORMAL HIGH (ref 0.44–1.00)
GFR, Estimated: >60 mL/min (ref >60)
Glucose, Bld: 84 mg/dL (ref 70–99)
Potassium: 3.7 mmol/L (ref 3.5–5.1)
Sodium: 139 mmol/L (ref 135–145)
Total Bilirubin: 0.4 mg/dL (ref <1.2)
Total Protein: 6.6 g/dL (ref 6.5–8.1)

## 2023-12-28 NOTE — ED Triage Notes (Signed)
Says she recently delivered on 12/17. Women's health RN followed up with patient today and pt stated how she has been having severe headaches, right sided abdominal pain and shortness of breath.   They encouraged her to ED for eval for postpartum preeclampsia. Said she went to [facility] but wait times exceeded her expectations.

## 2023-12-29 ENCOUNTER — Emergency Department (HOSPITAL_BASED_OUTPATIENT_CLINIC_OR_DEPARTMENT_OTHER)
Admission: EM | Admit: 2023-12-29 | Discharge: 2023-12-29 | Disposition: A | Discharge status: Home / Self Care | Payer: Medicaid Other | Attending: Emergency Medicine | Admitting: Emergency Medicine

## 2023-12-29 DIAGNOSIS — R519 Headache, unspecified: Secondary | ICD-10-CM

## 2023-12-29 NOTE — ED Provider Notes (Signed)
[place] EMERGENCY DEPARTMENT AT [facility]  Provider Note  CSN: [id number] Arrival date & time: 12/28/23 2246  History Chief Complaint  Patient presents with   Postpartum Follow-up    Andrea Archer is a 22 y.o. female who is approx 10 days post-partum from SVD of term infant. During a routine post-partum phone follow up on 12/24 she mentioned having intermittent headaches and R flank pain. She was advised then to go to [facility]U for evaluation of pre-eclampsia, but was unable to go until the night of 12/26. She states she waited for a long time, but never had labs done or saw a provider and so she left. She presents today for evaluation. She reports headaches are fleeting, associated with some photophobia. Abdominal pain is mostly in R flank, no fever, vomiting, diarrhea. Some pressure with urination. Bleeding has decreased appropriately.    Home Medications Prior to Admission medications   Medication Sig Start Date End Date Taking? Authorizing Provider  acetaminophen (TYLENOL) 325 MG tablet Take 2 tablets (650 mg total) by mouth every 4 (four) hours as needed (for pain scale < 4). 12/18/23   Carlynn Herald, CNM  Blood Pressure Monitoring (BLOOD PRESSURE KIT) DEVI 1 kit by Does not apply route once a week. Patient not taking: Reported on 12/16/2023 05/10/23   Warden Fillers, MD  cyclobenzaprine (FLEXERIL) 10 MG tablet Take 1 tablet (10 mg total) by mouth 3 (three) times daily as needed for muscle spasms. Patient not taking: Reported on 12/16/2023 10/16/23   Raelyn Mora, CNM  ibuprofen (ADVIL) 600 MG tablet Take 1 tablet (600 mg total) by mouth every 6 (six) hours. 12/18/23   Carlynn Herald, CNM  Magnesium 200 MG TABS Take 2 tablets (400 mg total) by mouth at bedtime. Patient not taking: Reported on 11/06/2023 09/26/23   Raelyn Mora, CNM  ondansetron (ZOFRAN) 4 MG tablet Take 1 tablet (4 mg total) by mouth every 8 (eight) hours as needed for nausea or  vomiting. Patient not taking: Reported on 12/16/2023 12/02/23   Adam Phenix, MD  Prenatal Vit-Fe Phos-FA-Omega (VITAFOL GUMMIES) 3.33-0.333-34.8 MG CHEW Chew 1 tablet by mouth daily. 06/14/23   Brock Bad, MD  valACYclovir (VALTREX) 500 MG tablet Take 1 tablet (500 mg total) by mouth 2 (two) times daily. 11/26/23   Leftwich-Kirby, Wilmer Floor, CNM  valACYclovir (VALTREX) 500 MG tablet Take 500 mg by mouth 2 (two) times daily. Patient not taking: Reported on 12/16/2023    [provider]     Allergies    Other and Pineapple   Review of Systems   Review of Systems Please see HPI for pertinent positives and negatives  Physical Exam BP 126/88   Pulse 84   Temp 98.2 F (36.8 C)   Resp 16   Ht 5\' 6"  (1.676 m)   Wt 86.2 kg   SpO2 100%   BMI 30.67 kg/m   Physical Exam Vitals and nursing note reviewed.  Constitutional:      Appearance: Normal appearance.  HENT:     Head: Normocephalic and atraumatic.     Nose: Nose normal.     Mouth/Throat:     Mouth: Mucous membranes are moist.  Eyes:     Extraocular Movements: Extraocular movements intact.     Conjunctiva/sclera: Conjunctivae normal.  Cardiovascular:     Rate and Rhythm: Normal rate.  Pulmonary:     Effort: Pulmonary effort is normal.     Breath sounds: Normal breath sounds.  Abdominal:  General: Abdomen is flat.     Palpations: Abdomen is soft. There is no mass.     Tenderness: There is no abdominal tenderness. There is no guarding.  Musculoskeletal:        General: No swelling. Normal range of motion.     Cervical back: Neck supple.  Skin:    General: Skin is warm and dry.  Neurological:     General: No focal deficit present.     Mental Status: She is alert.     Cranial Nerves: No cranial nerve deficit.     Sensory: No sensory deficit.     Motor: No weakness.  Psychiatric:        Mood and Affect: Mood normal.     ED Results / Procedures / Treatments    EKG None  Procedures Procedures  Medications Ordered in the ED Medications - No data to display  Initial Impression and Plan  Patient here with post-partum headache and flank pain, concern for pre-eclampsia. She has a normal BP, labs done in triage show unremarkable CBC, CMP. UA without proteinuria or signs of infection. No evidence of pre-eclampsia or HELLP syndrome. Spoke with Dr. Despina Hidden, Ob/Gyn, who does not recommend any further ED workup. Patient is breast feeding. Advised to take APAP as needed, stay hydrated and follow up with her Ob/Gyn for re-evaluation. RTED for any other concerns.   ED Course       MDM Rules/Calculators/A&P Medical Decision Making Problems Addressed: Acute nonintractable headache, unspecified headache type: acute illness or injury  Amount and/or Complexity of Data Reviewed Labs: ordered.     Final Clinical Impression(s) / ED Diagnoses Final diagnoses:  Acute nonintractable headache, unspecified headache type    Rx / DC Orders ED Discharge Orders     None        Pollyann Savoy, MD 12/29/23 [phone]

## 2023-12-29 NOTE — ED Notes (Signed)
Charge instructions reviewed.   Opportunity for questions and concerns provided.   Alert, oriented and ambulatory. Displays no signs of distress.   Encouraged to follow up with OBGYN.

## 2023-12-31 ENCOUNTER — Other Ambulatory Visit: Payer: Self-pay

## 2023-12-31 NOTE — Progress Notes (Signed)
Order for electric Breast Pump.

## 2024-01-03 ENCOUNTER — Telehealth (HOSPITAL_COMMUNITY): Payer: Self-pay | Admitting: *Deleted

## 2024-01-03 ENCOUNTER — Ambulatory Visit (INDEPENDENT_AMBULATORY_CARE_PROVIDER_SITE_OTHER): Payer: Medicaid Other

## 2024-01-03 VITALS — BP 120/79 | HR 78 | Wt 190.9 lb

## 2024-01-03 DIAGNOSIS — R300 Dysuria: Secondary | ICD-10-CM

## 2024-01-03 LAB — POCT URINALYSIS DIPSTICK
Bilirubin, UA: NEGATIVE
Glucose, UA: NEGATIVE
Ketones, UA: NEGATIVE
Nitrite, UA: NEGATIVE
Protein, UA: NEGATIVE
Spec Grav, UA: 1.01 (ref 1.010–1.025)
Urobilinogen, UA: 0.2 U/dL
pH, UA: 8 (ref 5.0–8.0)

## 2024-01-03 MED ORDER — CEPHALEXIN 500 MG PO CAPS: 500.0000 mg | ORAL_CAPSULE | Freq: Four times a day (QID) | ORAL | 0 refills | Status: DC

## 2024-01-03 MED ORDER — CEFADROXIL 500 MG PO CAPS: 500.0000 mg | ORAL_CAPSULE | Freq: Two times a day (BID) | ORAL | 0 refills | Status: DC

## 2024-01-03 NOTE — Telephone Encounter (Signed)
 Patient left message for RN on 12/27/23 requesting return call. Return call placed when RN returned to work on 01/03/24. Patient reported being seen at [facility] ER - reported pre-eclampsia ruled out. Patient continues to c/o headaches. Stated, They are not as bad, but I don't get any relief with Tylenol . Stated that sleeping and avoiding bright lights helps headaches. Also reported swelling in her eyelids. Additionally, patient c/o stinging with urination. Denied frequency. Denied fever. RN instructed patient to contact her OB. Patient verbalized understanding and a plan to follow-up. RN instructed patient to come to [facility]U if symptoms worsen or if she feels she needs to be evaluated before OB can see her. Allean IVAR Carton, RN, 01/03/24, [phone]

## 2024-01-03 NOTE — Progress Notes (Signed)
..  SUBJECTIVE: Andrea Archer is a 23 y.o. female who complains of burning with urination and pelvic discomfort for a few days days, without fever, chills, or abnormal vaginal discharge or bleeding.   OBJECTIVE: Appears well, in no apparent distress.  Vital signs are normal. Urine dipstick shows positive for RBC's and positive for leukocytes.    ASSESSMENT: Dysuria  PLAN: Treatment per orders.  Call or return to clinic prn if these symptoms worsen or fail to improve as anticipated.  Consulted with in-office provider and sent rx for antibiotic to the pharmacy.

## 2024-01-07 LAB — URINE CULTURE

## 2024-01-27 ENCOUNTER — Ambulatory Visit: Payer: Medicaid Other | Admitting: Advanced Practice Midwife

## 2024-01-28 ENCOUNTER — Encounter: Payer: Self-pay | Admitting: Obstetrics

## 2024-01-28 ENCOUNTER — Ambulatory Visit: Payer: Medicaid Other | Admitting: Obstetrics

## 2024-01-28 DIAGNOSIS — R519 Headache, unspecified: Secondary | ICD-10-CM

## 2024-01-28 DIAGNOSIS — Z3009 Encounter for other general counseling and advice on contraception: Secondary | ICD-10-CM

## 2024-01-28 MED ORDER — VITAFOL GUMMIES 3.33-0.333-34.8 MG PO CHEW: 1.0000 | CHEWABLE_TABLET | Freq: Every day | ORAL | 5 refills | Status: DC

## 2024-01-28 MED ORDER — BUTALBITAL-APAP-CAFFEINE 50-325-40 MG PO TABS: 2.0000 | ORAL_TABLET | Freq: Four times a day (QID) | ORAL | 2 refills | Status: DC | PRN

## 2024-01-28 NOTE — Progress Notes (Signed)
Post Partum Visit Note  Andrea Archer is a 23 y.o. G42P2012 female who presents for a postpartum visit. She is 6 weeks postpartum following a normal spontaneous vaginal delivery.  I have fully reviewed the prenatal and intrapartum course. The delivery was at 39 gestational weeks.  Anesthesia: none. Postpartum course has been fun. Baby is doing well yes. Baby is feeding by breast. Bleeding no bleeding. Bowel function is normal. Bladder function is normal. Patient is sexually active. Contraception method is condoms. Postpartum depression screening: negative.   The pregnancy intention screening data noted above was reviewed. Potential methods of contraception were discussed. The patient elected to proceed with No data recorded.   Edinburgh Postnatal Depression Scale - 01/28/24 1052       Edinburgh Postnatal Depression Scale:  In the Past 7 Days   I have been able to laugh and see the funny side of things. 0    I have looked forward with enjoyment to things. 0    I have blamed myself unnecessarily when things went wrong. 0    I have been anxious or worried for no good reason. 2    I have felt scared or panicky for no good reason. 0    Things have been getting on top of me. 0    I have been so unhappy that I have had difficulty sleeping. 0    I have felt sad or miserable. 1    I have been so unhappy that I have been crying. 0    The thought of harming myself has occurred to me. 0    Edinburgh Postnatal Depression Scale Total 3             Health Maintenance Due  Topic Date Due   HPV VACCINES (1 - 3-dose series) Never done   COVID-19 Vaccine (1 - 2024-25 season) Never done    The following portions of the patient's history were reviewed and updated as appropriate: allergies, current medications, past family history, past medical history, past social history, past surgical history, and problem list.  Review of Systems Neurological: positive for headaches  Objective:  BP 113/76    Pulse 71   Ht 5\' 6"  (1.676 m)   Wt 190 lb 11.2 oz (86.5 kg)   LMP 03/15/2023 (Exact Date)   Breastfeeding Yes   BMI 30.78 kg/m    General:  alert and no distress   Breasts:  normal  Lungs: clear to auscultation bilaterally  Heart:  regular rate and rhythm, S1, S2 normal, no murmur, click, rub or gallop  Abdomen: soft, non-tender; bowel sounds normal; no masses,  no organomegaly   Wound none  GU exam:  not indicated       Assessment:   1. Postpartum care following vaginal delivery (Primary) Rx: - Prenatal Vit-Fe Phos-FA-Omega (VITAFOL GUMMIES) 3.33-0.333-34.8 MG CHEW; Chew 1 tablet by mouth daily.  Dispense: 90 tablet; Refill: 5  2. New onset of headaches Rx:- - butalbital-acetaminophen-caffeine (FIORICET) 50-325-40 MG tablet; Take 2 tablets by mouth every 6 (six) hours as needed for headache.  Dispense: 30 tablet; Refill: 2 - Ambulatory referral to Neurology  3. Encounter for counseling regarding contraception - declines hormonal contraception at this time, with headaches getting worse - using condoms    Plan:   Essential components of care per ACOG recommendations:  1.  Mood and well being: Patient with negative depression screening today. Reviewed local resources for support.  - Patient tobacco use? No.   - hx  of drug use? No.    2. Infant care and feeding:  -Patient currently breastmilk feeding? Yes. Discussed returning to work and pumping. Reviewed importance of draining breast regularly to support lactation.  -Social determinants of health (SDOH) reviewed in EPIC. No concerns  3. Sexuality, contraception and birth spacing - Patient does not want a pregnancy in the next year.  Desired family size is 3 children.  - Reviewed reproductive life planning. Reviewed contraceptive methods based on pt preferences and effectiveness.  Patient desired Female Condom today.   - Discussed birth spacing of 18 months  4. Sleep and fatigue -Encouraged family/partner/community  support of 4 hrs of uninterrupted sleep to help with mood and fatigue  5. Physical Recovery  - Discussed patients delivery and complications. She describes her labor as good. - Patient had a Vaginal, no problems at delivery. Patient had  no  laceration.  - Patient has urinary incontinence? No. - Patient is safe to resume physical and sexual activity  6.  Health Maintenance - HM due items addressed Yes - Last pap smear  Diagnosis  Date Value Ref Range Status  11/09/2022      - Negative for Intraepithelial Lesions or Malignancy (NILM)  11/09/2022 - Benign reactive/reparative changes     Pap smear not done at today's visit.  -Breast Cancer screening indicated? No.   7. Chronic Disease/Pregnancy Condition follow up: None   Coral Ceo, MD [facility], [facility], [place] 01/28/2024

## 2024-01-28 NOTE — Progress Notes (Signed)
Pt. Presents for pp visit. Pt. Is undecided on bc at this time but is using condoms for now.

## 2024-01-30 ENCOUNTER — Encounter: Payer: Self-pay | Admitting: Neurology

## 2024-01-30 ENCOUNTER — Ambulatory Visit (INDEPENDENT_AMBULATORY_CARE_PROVIDER_SITE_OTHER): Payer: Medicaid Other | Admitting: Neurology

## 2024-01-30 VITALS — BP 124/78 | HR 64 | Ht 66.0 in | Wt 192.0 lb

## 2024-01-30 DIAGNOSIS — R519 Headache, unspecified: Secondary | ICD-10-CM

## 2024-01-30 NOTE — Progress Notes (Deleted)
[facility]  PATIENT: Andrea Archer DOB: [birth year]  REQUESTING CLINICIAN: No ref. provider found HISTORY FROM: Patient  REASON FOR VISIT: Headaches post partum    HISTORICAL  CHIEF COMPLAINT:  Chief Complaint  Patient presents with   New Patient (Initial Visit)    Pt in 12, here alone  Pt is referred by Dr Coral Ceo for new onset of headaches. Pt states she is having daily headaches. Pt states her headaches start on her neck and move up her head. Pt states her headaches started after giving birth to her 4 month old.     HISTORY OF PRESENT ILLNESS:  ***   Headache History and Characteristics: Onset:  Location:  Quality:   Intensity: /10.  Duration: Migrainous Features: Photophobia, phonophobia, nausea, vomiting.  Aura: No  History of brain injury or tumor: No  Family history: Motion sickness: no Cardiac history: no  OTC: tylenol, codeine Caffeine: Sleep:  Mood/ Stress:   Prior prophylaxis: Propranolol: No  Verapamil:No TCA: No Topamax: No Depakote: No Effexor: No Cymbalta: No Neurontin:No  Prior abortives: Triptan: No Anti-emetic: No Steroids: No Ergotamine suppository: No    OTHER MEDICAL CONDITIONS: ***   REVIEW OF SYSTEMS: Full 14 system review of systems performed and negative with exception of: ***  ALLERGIES: Allergies  Allergen Reactions   Other     Henna - rash   Pineapple Rash    HOME MEDICATIONS: Outpatient Medications Prior to Visit  Medication Sig Dispense Refill   Blood Pressure Monitoring (BLOOD PRESSURE KIT) DEVI 1 kit by Does not apply route once a week. 1 each 0   Prenatal Vit-Fe Phos-FA-Omega (VITAFOL GUMMIES) 3.33-0.333-34.8 MG CHEW Chew 1 tablet by mouth daily. 90 tablet 5   acetaminophen (TYLENOL) 325 MG tablet Take 2 tablets (650 mg total) by mouth every 4 (four) hours as needed (for pain scale < 4). (Patient not taking: Reported on 01/30/2024) 60 tablet 0    butalbital-acetaminophen-caffeine (FIORICET) 50-325-40 MG tablet Take 2 tablets by mouth every 6 (six) hours as needed for headache. (Patient not taking: Reported on 01/30/2024) 30 tablet 2   ibuprofen (ADVIL) 600 MG tablet Take 1 tablet (600 mg total) by mouth every 6 (six) hours. (Patient not taking: Reported on 01/03/2024) 30 tablet 0   ondansetron (ZOFRAN) 4 MG tablet Take 1 tablet (4 mg total) by mouth every 8 (eight) hours as needed for nausea or vomiting. (Patient not taking: Reported on 01/30/2024) 20 tablet 0   cefadroxil (DURICEF) 500 MG capsule Take 1 capsule (500 mg total) by mouth 2 (two) times daily. (Patient not taking: Reported on 01/28/2024) 14 capsule 0   cyclobenzaprine (FLEXERIL) 10 MG tablet Take 1 tablet (10 mg total) by mouth 3 (three) times daily as needed for muscle spasms. (Patient not taking: Reported on 12/16/2023) 30 tablet 0   Magnesium 200 MG TABS Take 2 tablets (400 mg total) by mouth at bedtime. (Patient not taking: Reported on 11/06/2023) 30 tablet 1   valACYclovir (VALTREX) 500 MG tablet Take 1 tablet (500 mg total) by mouth 2 (two) times daily. (Patient not taking: Reported on 01/28/2024) 60 tablet 1   valACYclovir (VALTREX) 500 MG tablet Take 500 mg by mouth 2 (two) times daily. (Patient not taking: Reported on 12/16/2023)     No facility-administered medications prior to visit.    PAST MEDICAL HISTORY: Past Medical History:  Diagnosis Date   Anemia    Anxiety    Bacterial vaginitis    Depression    Hypertension  with pregnancy   Pregnant    Sickle cell trait (HCC)    UTI (urinary tract infection)     PAST SURGICAL HISTORY: Past Surgical History:  Procedure Laterality Date   FINGER SURGERY Left    Pinky Finger   FRACTURE SURGERY  01/09/2019   left pinky finger    FAMILY HISTORY: Family History  Problem Relation Age of Onset   Cancer Mother        cervical ca 2023   Hypertension Mother    Hypertension Father    Heart disease Father    Sickle  cell anemia Father    Sickle cell trait Father        has sickle cell   Diabetes Maternal Grandmother    Hypertension Maternal Grandmother    Heart disease Maternal Grandmother    Fibromyalgia Maternal Grandmother    Hypertension Maternal Grandfather    Pancreatic cancer Maternal Grandfather    Hypertension Paternal Grandmother    Hypertension Paternal Grandfather    Colon cancer Neg Hx    Stomach cancer Neg Hx    Esophageal cancer Neg Hx    Rectal cancer Neg Hx     SOCIAL HISTORY: Social History   Socioeconomic History   Marital status: Single    Spouse name: Not on file   Number of children: Not on file   Years of education: Not on file   Highest education level: Not on file  Occupational History   Not on file  Tobacco Use   Smoking status: Former    Types: Cigars    Passive exposure: Past   Smokeless tobacco: Never   Tobacco comments:    Quit 2022  Vaping Use   Vaping status: Never Used  Substance and Sexual Activity   Alcohol use: Not Currently    Comment: occ, not since confirmed pregnancy   Drug use: No   Sexual activity: Yes    Partners: Male    Birth control/protection: None  Other Topics Concern   Not on file  Social History Narrative   Not on file   Social Drivers of Health   Financial Resource Strain: Not on File (04/19/2022)   Received from [employer], [employer]    Financial Resource Strain: 0  Food Insecurity: No Food Insecurity (12/17/2023)   Hunger Vital Sign    Worried About Running Out of Food in the Last Year: Never true    Ran Out of Food in the Last Year: Never true  Transportation Needs: No Transportation Needs (12/17/2023)   PRAPARE - Administrator, Civil Service (Medical): No    Lack of Transportation (Non-Medical): No  Physical Activity: Not on File (04/19/2022)   Received from [place], [place]   Physical Activity    Physical Activity: 0  Stress: Not on File (04/19/2022)   Received from [facility], [place]    Stress    Stress: 0  Social Connections: Not on File (09/20/2023)   Received from [facility]   Social Connections    Connectedness: 0  Intimate Partner Violence: Not At Risk (12/17/2023)   Humiliation, Afraid, Rape, and Kick questionnaire    Fear of Current or Ex-Partner: No    Emotionally Abused: No    Physically Abused: No    Sexually Abused: No     PHYSICAL EXAM ***  GENERAL EXAM/CONSTITUTIONAL: Vitals:  Vitals:   01/30/24 1014  BP: 124/78  Pulse: 64  Weight: 192 lb (87.1 kg)  Height: 5\' 6"  (1.676 m)  Body mass index is 30.99 kg/m. Wt Readings from Last 3 Encounters:  01/30/24 192 lb (87.1 kg)  01/28/24 190 lb 11.2 oz (86.5 kg)  01/03/24 190 lb 14.4 oz (86.6 kg)   Patient is in no distress; well developed, nourished and groomed; neck is supple  MUSCULOSKELETAL: Gait, strength, tone, movements noted in Neurologic exam below  NEUROLOGIC: MENTAL STATUS:      No data to display         awake, alert, oriented to person, place and time recent and remote memory intact normal attention and concentration language fluent, comprehension intact, naming intact fund of knowledge appropriate  CRANIAL NERVE:  2nd - no papilledema or hemorrhages on fundoscopic exam 2nd, 3rd, 4th, 6th - pupils equal and reactive to light, visual fields full to confrontation, extraocular muscles intact, no nystagmus 5th - facial sensation symmetric 7th - facial strength symmetric 8th - hearing intact 9th - palate elevates symmetrically, uvula midline 11th - shoulder shrug symmetric 12th - tongue protrusion midline  MOTOR:  normal bulk and tone, full strength in the BUE, BLE  SENSORY:  normal and symmetric to light touch, pinprick  COORDINATION:  finger-nose-finger, fine finger movements normal  GAIT/STATION:  normal     DIAGNOSTIC DATA (LABS, IMAGING, TESTING) - I reviewed patient records, labs, notes, testing and imaging myself where available.  Lab Results  Component  Value Date   WBC 10.9 (H) 12/28/2023   HGB 12.6 12/28/2023   HCT 38.3 12/28/2023   MCV 79.1 (L) 12/28/2023   PLT 359 12/28/2023      Component Value Date/Time   NA 139 12/28/2023 2308   NA 144 03/28/2021 1438   K 3.7 12/28/2023 2308   CL 105 12/28/2023 2308   CO2 26 12/28/2023 2308   GLUCOSE 84 12/28/2023 2308   BUN 9 12/28/2023 2308   BUN 14 03/28/2021 1438   CREATININE 1.09 (H) 12/28/2023 2308   CALCIUM 9.1 12/28/2023 2308   PROT 6.6 12/28/2023 2308   PROT 7.4 03/28/2021 1438   ALBUMIN 4.0 12/28/2023 2308   ALBUMIN 4.7 03/28/2021 1438   AST 22 12/28/2023 2308   ALT 25 12/28/2023 2308   ALKPHOS 89 12/28/2023 2308   BILITOT 0.4 12/28/2023 2308   BILITOT 0.5 03/28/2021 1438   GFRNONAA >60 12/28/2023 2308   GFRAA >60 07/24/2020 1843   No results found for: "CHOL", "HDL", "LDLCALC", "LDLDIRECT", "TRIG", "CHOLHDL" Lab Results  Component Value Date   HGBA1C 5.0 06/11/2023   No results found for: "VITAMINB12" Lab Results  Component Value Date   TSH 2.620 01/11/2022    ***    ASSESSMENT AND PLAN  23 y.o. year old female with ***   No diagnosis found.  There are no Patient Instructions on file for this visit.   No orders of the defined types were placed in this encounter.   No orders of the defined types were placed in this encounter.   No follow-ups on file.    Windell Norfolk, MD 01/30/2024, 10:22 AM  [facility] [address] [place], [place] [address] [phone]

## 2024-01-30 NOTE — Patient Instructions (Addendum)
MRI Brain with and without contrast  MRV Brain to rule out venous sinus thrombosis I will contact you to go over the results  Restart Magnesium 400 mg nightly.  Return sooner if worse

## 2024-01-30 NOTE — Progress Notes (Signed)
[facility]  PATIENT: Andrea Archer DOB: 2001-06-23  REQUESTING CLINICIAN: No ref. provider found HISTORY FROM: Patient  REASON FOR VISIT: New onset headaches post partum    HISTORICAL  CHIEF COMPLAINT:  Chief Complaint  Patient presents with   New Patient (Initial Visit)    Pt in 12, here alone  Pt is referred by Dr Coral Ceo for new onset of headaches. Pt states she is having daily headaches. Pt states her headaches start on her neck and move up her head. Pt states her headaches started after giving birth to her 47 month old.     HISTORY OF PRESENT ILLNESS:  This is a 23 year old woman with no reported past medical history who is presenting with complaint of new headaches postpartum.  Patient reports that she developed headache after giving birth on December 17.  The headache starts in the back of the neck, then radiated on the top of head, then  behind the right eye described them as throbbing pain.  These headaches are worse when waking up in the morning and last about half an hour and the intensity will decrease.  She has tried Tylenol without any benefit.  Sometimes is associated dizziness but no nausea no vomiting no light sensitivity.  She denies any previous history of headaches, denies any headaches during pregnancy, all of this started after giving birth.  She is reported her delivery was uncomplicated vaginal delivery.   Headache History and Characteristics: Onset: December 17 Location: Start in back of head, then radiates to the top and behind right eye Quality:  throbbing pain Intensity: 6-10/10.  Duration: less than an hour Migrainous Features: None Aura: No  History of brain injury or tumor: No  Family history: Brother and Sister  Motion sickness: no Cardiac history: no  OTC: tylenol not helpful  Sleep: Okay, has a newborn  Prior prophylaxis: Propranolol: No  Verapamil:No TCA: No Topamax: No Depakote: No Effexor: No Cymbalta:  No Neurontin:No  Prior abortives: Triptan: No Anti-emetic: No Steroids: No Ergotamine suppository: No    OTHER MEDICAL CONDITIONS: None reported    REVIEW OF SYSTEMS: Full 14 system review of systems performed and negative with exception of: As noted in the HPI  ALLERGIES: Allergies  Allergen Reactions   Other     Henna - rash   Pineapple Rash    HOME MEDICATIONS: Outpatient Medications Prior to Visit  Medication Sig Dispense Refill   Blood Pressure Monitoring (BLOOD PRESSURE KIT) DEVI 1 kit by Does not apply route once a week. 1 each 0   Prenatal Vit-Fe Phos-FA-Omega (VITAFOL GUMMIES) 3.33-0.333-34.8 MG CHEW Chew 1 tablet by mouth daily. 90 tablet 5   acetaminophen (TYLENOL) 325 MG tablet Take 2 tablets (650 mg total) by mouth every 4 (four) hours as needed (for pain scale < 4). (Patient not taking: Reported on 01/30/2024) 60 tablet 0   butalbital-acetaminophen-caffeine (FIORICET) 50-325-40 MG tablet Take 2 tablets by mouth every 6 (six) hours as needed for headache. (Patient not taking: Reported on 01/30/2024) 30 tablet 2   ibuprofen (ADVIL) 600 MG tablet Take 1 tablet (600 mg total) by mouth every 6 (six) hours. (Patient not taking: Reported on 01/03/2024) 30 tablet 0   ondansetron (ZOFRAN) 4 MG tablet Take 1 tablet (4 mg total) by mouth every 8 (eight) hours as needed for nausea or vomiting. (Patient not taking: Reported on 01/30/2024) 20 tablet 0   cefadroxil (DURICEF) 500 MG capsule Take 1 capsule (500 mg total) by mouth 2 (two)  times daily. (Patient not taking: Reported on 01/28/2024) 14 capsule 0   cyclobenzaprine (FLEXERIL) 10 MG tablet Take 1 tablet (10 mg total) by mouth 3 (three) times daily as needed for muscle spasms. (Patient not taking: Reported on 12/16/2023) 30 tablet 0   Magnesium 200 MG TABS Take 2 tablets (400 mg total) by mouth at bedtime. (Patient not taking: Reported on 11/06/2023) 30 tablet 1   valACYclovir (VALTREX) 500 MG tablet Take 1 tablet (500 mg total) by  mouth 2 (two) times daily. (Patient not taking: Reported on 01/28/2024) 60 tablet 1   valACYclovir (VALTREX) 500 MG tablet Take 500 mg by mouth 2 (two) times daily. (Patient not taking: Reported on 12/16/2023)     No facility-administered medications prior to visit.    PAST MEDICAL HISTORY: Past Medical History:  Diagnosis Date   Anemia    Anxiety    Bacterial vaginitis    Depression    Hypertension    with pregnancy   Pregnant    Sickle cell trait (HCC)    UTI (urinary tract infection)     PAST SURGICAL HISTORY: Past Surgical History:  Procedure Laterality Date   FINGER SURGERY Left    Pinky Finger   FRACTURE SURGERY  01/09/2019   left pinky finger    FAMILY HISTORY: Family History  Problem Relation Age of Onset   Cancer Mother        cervical ca 2023   Hypertension Mother    Hypertension Father    Heart disease Father    Sickle cell anemia Father    Sickle cell trait Father        has sickle cell   Diabetes Maternal Grandmother    Hypertension Maternal Grandmother    Heart disease Maternal Grandmother    Fibromyalgia Maternal Grandmother    Hypertension Maternal Grandfather    Pancreatic cancer Maternal Grandfather    Hypertension Paternal Grandmother    Hypertension Paternal Grandfather    Colon cancer Neg Hx    Stomach cancer Neg Hx    Esophageal cancer Neg Hx    Rectal cancer Neg Hx     SOCIAL HISTORY: Social History   Socioeconomic History   Marital status: Single    Spouse name: Not on file   Number of children: Not on file   Years of education: Not on file   Highest education level: Not on file  Occupational History   Not on file  Tobacco Use   Smoking status: Former    Types: Cigars    Passive exposure: Past   Smokeless tobacco: Never   Tobacco comments:    Quit 2022  Vaping Use   Vaping status: Never Used  Substance and Sexual Activity   Alcohol use: Not Currently    Comment: occ, not since confirmed pregnancy   Drug use: No    Sexual activity: Yes    Partners: Male    Birth control/protection: None  Other Topics Concern   Not on file  Social History Narrative   Not on file   Social Drivers of Health   Financial Resource Strain: Not on File (04/19/2022)   Received from [employer] [employer]    Financial Resource Strain: 0  Food Insecurity: No Food Insecurity (12/17/2023)   Hunger Vital Sign    Worried About Running Out of Food in the Last Year: Never true    Ran Out of Food in the Last Year: Never true  Transportation Needs: No Transportation Needs (  12/17/2023)   PRAPARE - Administrator, Civil Service (Medical): No    Lack of Transportation (Non-Medical): No  Physical Activity: Not on File (04/19/2022)   Received from [place], [place]   Physical Activity    Physical Activity: 0  Stress: Not on File (04/19/2022)   Received from [facility], [place]   Stress    Stress: 0  Social Connections: Not on File (09/20/2023)   Received from [facility]   Social Connections    Connectedness: 0  Intimate Partner Violence: Not At Risk (12/17/2023)   Humiliation, Afraid, Rape, and Kick questionnaire    Fear of Current or Ex-Partner: No    Emotionally Abused: No    Physically Abused: No    Sexually Abused: No    PHYSICAL EXAM  GENERAL EXAM/CONSTITUTIONAL: Vitals:  Vitals:   01/30/24 1014  BP: 124/78  Pulse: 64  Weight: 192 lb (87.1 kg)  Height: 5\' 6"  (1.676 m)   Body mass index is 30.99 kg/m. Wt Readings from Last 3 Encounters:  01/30/24 192 lb (87.1 kg)  01/28/24 190 lb 11.2 oz (86.5 kg)  01/03/24 190 lb 14.4 oz (86.6 kg)   Patient is in no distress; well developed, nourished and groomed; neck is supple  MUSCULOSKELETAL: Gait, strength, tone, movements noted in Neurologic exam below  NEUROLOGIC: MENTAL STATUS:      No data to display         awake, alert, oriented to person, place and time recent and remote memory intact normal attention and concentration language  fluent, comprehension intact, naming intact fund of knowledge appropriate  CRANIAL NERVE:  2nd - no papilledema or hemorrhages on fundoscopic exam 2nd, 3rd, 4th, 6th - pupils equal and reactive to light, visual fields full to confrontation, extraocular muscles intact, no nystagmus 5th - facial sensation symmetric 7th - facial strength symmetric 8th - hearing intact 9th - palate elevates symmetrically, uvula midline 11th - shoulder shrug symmetric 12th - tongue protrusion midline  MOTOR:  normal bulk and tone, full strength in the BUE, BLE  SENSORY:  normal and symmetric to light touch, pinprick  COORDINATION:  finger-nose-finger, fine finger movements normal  GAIT/STATION:  normal   DIAGNOSTIC DATA (LABS, IMAGING, TESTING) - I reviewed patient records, labs, notes, testing and imaging myself where available.  Lab Results  Component Value Date   WBC 10.9 (H) 12/28/2023   HGB 12.6 12/28/2023   HCT 38.3 12/28/2023   MCV 79.1 (L) 12/28/2023   PLT 359 12/28/2023      Component Value Date/Time   NA 139 12/28/2023 2308   NA 144 03/28/2021 1438   K 3.7 12/28/2023 2308   CL 105 12/28/2023 2308   CO2 26 12/28/2023 2308   GLUCOSE 84 12/28/2023 2308   BUN 9 12/28/2023 2308   BUN 14 03/28/2021 1438   CREATININE 1.09 (H) 12/28/2023 2308   CALCIUM 9.1 12/28/2023 2308   PROT 6.6 12/28/2023 2308   PROT 7.4 03/28/2021 1438   ALBUMIN 4.0 12/28/2023 2308   ALBUMIN 4.7 03/28/2021 1438   AST 22 12/28/2023 2308   ALT 25 12/28/2023 2308   ALKPHOS 89 12/28/2023 2308   BILITOT 0.4 12/28/2023 2308   BILITOT 0.5 03/28/2021 1438   GFRNONAA >60 12/28/2023 2308   GFRAA >60 07/24/2020 1843   No results found for: "CHOL", "HDL", "LDLCALC", "LDLDIRECT", "TRIG", "CHOLHDL" Lab Results  Component Value Date   HGBA1C 5.0 06/11/2023   No results found for: "VITAMINB12" Lab Results  Component Value Date  TSH 2.620 01/11/2022     ASSESSMENT AND PLAN  23 y.o. year old female with  with no reported past medical history, 1 month postpartum who is presenting with new onset headache that started the day after delivery of her baby.  Headaches are worse in the morning when waking up.  Plan will be to rule out venous sinus thrombosis with MRI and MRV.  If negative, we will treat this headache as tension type headache.  I will contact her to go over the result of the MRI. Continue to follow with your doctor and present to urgent care/ED if you have worsening headache, any loss of vision, any focal neurological deficit associated with headache. She voices understanding and she is comfortable with plans.    1. New onset of headaches     Patient Instructions  MRI Brain with and without contrast  MRV Brain to rule out venous sinus thrombosis I will contact you to go over the results  Restart Magnesium 400 mg nightly.  Return sooner if worse    Orders Placed This Encounter  Procedures   MR BRAIN W WO CONTRAST   MR MRV HEAD WO CM    No orders of the defined types were placed in this encounter.   Return if symptoms worsen or fail to improve.    Windell Norfolk, MD 01/30/2024, 10:52 AM  [facility] [address] [place], [place] [address] [phone]

## 2024-02-04 ENCOUNTER — Telehealth: Payer: Self-pay | Admitting: Neurology

## 2024-02-04 NOTE — Telephone Encounter (Signed)
Pt called to check on referral Radiology test. Informed patient MRI Coordinator will send PA to insurance once approved will facility will call to schedule appt.

## 2024-02-05 ENCOUNTER — Telehealth: Payer: Self-pay | Admitting: Neurology

## 2024-02-05 NOTE — Telephone Encounter (Signed)
 MRI brain Healthy blue Siegfried Dress: [id number] exp. 02/05/24-04/04/24  MRV head healthy blue Siegfried Dress: [id number] exp. 02/05/24-04/04/24 sent to GI [phone]

## 2024-02-25 ENCOUNTER — Other Ambulatory Visit (HOSPITAL_COMMUNITY)
Admission: RE | Admit: 2024-02-25 | Discharge: 2024-02-25 | Disposition: A | Discharge status: Home / Self Care | Payer: Medicaid Other | Source: Ambulatory Visit | Attending: Obstetrics | Admitting: Obstetrics

## 2024-02-25 ENCOUNTER — Ambulatory Visit: Payer: Medicaid Other | Admitting: Obstetrics

## 2024-02-25 ENCOUNTER — Encounter: Payer: Self-pay | Admitting: Obstetrics

## 2024-02-25 NOTE — Progress Notes (Signed)
 Subjective:     Andrea Archer is a 23 y.o. female who presents for a postpartum visit. She is 9 weeks postpartum following a spontaneous vaginal delivery. I have fully reviewed the prenatal and intrapartum course. The delivery was at 39 gestational weeks. Outcome: spontaneous vaginal delivery. Anesthesia: none. Postpartum course has been normal. Baby's course has been normal. Baby is feeding by both breast and bottle - Similac with Iron. Bleeding no bleeding. Bowel function is normal. Bladder function is normal. Patient is sexually active. Contraception method is condoms. Postpartum depression screening: positive.  Tobacco, alcohol and substance abuse history reviewed.  Adult immunizations reviewed including TDAP, rubella and varicella.  The following portions of the patient's history were reviewed and updated as appropriate: allergies, current medications, past family history, past medical history, past social history, past surgical history, and problem list.  Review of Systems A comprehensive review of systems was negative except for: Behavioral/Psych: positive for depression   Objective:    BP 119/80   Pulse 67   Ht 5\' 7"  (1.702 m)   Wt 192 lb (87.1 kg)   LMP 01/30/2024   Breastfeeding Yes   BMI 30.07 kg/m   General:  alert and no distress   Breasts:  inspection negative, no nipple discharge or bleeding, no masses or nodularity palpable  Lungs: clear to auscultation bilaterally  Heart:  regular rate and rhythm, S1, S2 normal, no murmur, click, rub or gallop  Abdomen: soft, non-tender; bowel sounds normal; no masses,  no organomegaly   Vulva:  normal  Vagina: normal vagina  Cervix:  no cervical motion tenderness  Corpus: normal size, contour, position, consistency, mobility, non-tender  Adnexa:  normal adnexa  Rectal Exam: Not performed.         I have spent a total of 30 minutes of face-to-face time, excluding clinical staff time, reviewing notes and preparing to see patient,  ordering tests and/or medications, and counseling the patient.   Assessment:    1. Postpartum care following vaginal delivery (Primary) Rx: - POCT Urinalysis Dipstick - Culture, OB Urine - Cervicovaginal ancillary only( [place])   Plan:    1. Contraception: condoms 2. Follow up in: 3 months or as needed.   Healthy lifestyle practices reviewed   Brock Bad,  MD, FACOG Attending Obstetrician & Gynecologist, [facility] for [facility], [facility], [place] 02/25/2024

## 2024-02-25 NOTE — Progress Notes (Signed)
 Still having headaches. Orders for MRI by neurologist. Current method of birth control condoms. Reports some depression, has a therapist.

## 2024-02-26 LAB — CERVICOVAGINAL ANCILLARY ONLY
Bacterial Vaginitis (gardnerella): POSITIVE — AB
Candida Glabrata: NEGATIVE
Candida Vaginitis: NEGATIVE
Chlamydia: NEGATIVE
Comment: NEGATIVE
Comment: NEGATIVE
Comment: NEGATIVE
Comment: NEGATIVE
Comment: NEGATIVE
Comment: NORMAL
Neisseria Gonorrhea: NEGATIVE
Trichomonas: NEGATIVE

## 2024-02-27 ENCOUNTER — Other Ambulatory Visit: Payer: Self-pay | Admitting: Obstetrics

## 2024-02-27 DIAGNOSIS — N760 Acute vaginitis: Secondary | ICD-10-CM

## 2024-02-27 MED ORDER — METRONIDAZOLE 500 MG PO TABS: 500.0000 mg | ORAL_TABLET | Freq: Two times a day (BID) | ORAL | 2 refills | Status: DC

## 2024-02-28 ENCOUNTER — Other Ambulatory Visit: Payer: Self-pay

## 2024-02-28 MED ORDER — METRONIDAZOLE 0.75 % VA GEL: 1.0000 | Freq: Every day | VAGINAL | 0 refills | Status: AC

## 2024-02-28 NOTE — Progress Notes (Signed)
 Pt requested metrogel instead of flagyl for bv

## 2024-02-29 LAB — URINE CULTURE, OB REFLEX

## 2024-02-29 LAB — CULTURE, OB URINE

## 2024-03-05 ENCOUNTER — Other Ambulatory Visit: Payer: Self-pay

## 2024-03-05 DIAGNOSIS — R197 Diarrhea, unspecified: Secondary | ICD-10-CM | POA: Diagnosis not present

## 2024-03-05 DIAGNOSIS — Z5321 Procedure and treatment not carried out due to patient leaving prior to being seen by health care provider: Secondary | ICD-10-CM | POA: Diagnosis not present

## 2024-03-05 DIAGNOSIS — R112 Nausea with vomiting, unspecified: Secondary | ICD-10-CM | POA: Diagnosis present

## 2024-03-05 LAB — URINALYSIS, ROUTINE W REFLEX MICROSCOPIC
Bacteria, UA: NONE SEEN
Bilirubin Urine: NEGATIVE
Glucose, UA: NEGATIVE mg/dL
Hgb urine dipstick: NEGATIVE
Ketones, ur: NEGATIVE mg/dL
Leukocytes,Ua: NEGATIVE
Nitrite: NEGATIVE
Protein, ur: NEGATIVE mg/dL
Specific Gravity, Urine: 1.015 (ref 1.005–1.030)
pH: 5 (ref 5.0–8.0)

## 2024-03-05 LAB — CBC
HCT: 40.6 % (ref 36.0–46.0)
Hemoglobin: 13.7 g/dL (ref 12.0–15.0)
MCH: 27 pg (ref 26.0–34.0)
MCHC: 33.7 g/dL (ref 30.0–36.0)
MCV: 79.9 fL — ABNORMAL LOW (ref 80.0–100.0)
Platelets: 289 10*3/uL (ref 150–400)
RBC: 5.08 MIL/uL (ref 3.87–5.11)
RDW: 15.1 % (ref 11.5–15.5)
WBC: 13.4 10*3/uL — ABNORMAL HIGH (ref 4.0–10.5)
nRBC: 0 % (ref 0.0–0.2)

## 2024-03-05 LAB — COMPREHENSIVE METABOLIC PANEL
ALT: 15 U/L (ref 0–44)
AST: 14 U/L — ABNORMAL LOW (ref 15–41)
Albumin: 4.7 g/dL (ref 3.5–5.0)
Alkaline Phosphatase: 72 U/L (ref 38–126)
Anion gap: 11 (ref 5–15)
BUN: 13 mg/dL (ref 6–20)
CO2: 23 mmol/L (ref 22–32)
Calcium: 9.6 mg/dL (ref 8.9–10.3)
Chloride: 104 mmol/L (ref 98–111)
Creatinine, Ser: 0.97 mg/dL (ref 0.44–1.00)
GFR, Estimated: >60 mL/min (ref >60)
Glucose, Bld: 96 mg/dL (ref 70–99)
Potassium: 4 mmol/L (ref 3.5–5.1)
Sodium: 138 mmol/L (ref 135–145)
Total Bilirubin: 0.9 mg/dL (ref 0.0–1.2)
Total Protein: 7.6 g/dL (ref 6.5–8.1)

## 2024-03-05 LAB — LIPASE, BLOOD: Lipase: 28 U/L (ref 11–51)

## 2024-03-05 MED ORDER — ONDANSETRON 4 MG PO TBDP
4.0000 mg | ORAL_TABLET | Freq: Once | ORAL | Status: AC
  Administered 2024-03-05: 4 mg via ORAL
  Filled 2024-03-05: qty 1

## 2024-03-05 NOTE — ED Triage Notes (Signed)
 N/V/D x24. Breast feeding.

## 2024-03-06 ENCOUNTER — Emergency Department (HOSPITAL_BASED_OUTPATIENT_CLINIC_OR_DEPARTMENT_OTHER)
Admission: EM | Admit: 2024-03-06 | Discharge: 2024-03-06 | Discharge status: Against Medical Advice | Attending: Emergency Medicine | Admitting: Emergency Medicine

## 2024-03-06 LAB — RESP PANEL BY RT-PCR (RSV, FLU A&B, COVID)  RVPGX2
Influenza A by PCR: NEGATIVE
Influenza B by PCR: NEGATIVE
Resp Syncytial Virus by PCR: NEGATIVE
SARS Coronavirus 2 by RT PCR: NEGATIVE

## 2024-03-06 NOTE — ED Notes (Signed)
 Given antiemetic. Gatorade given. Instructed to wait until 0000 to try sipping.

## 2024-03-10 ENCOUNTER — Ambulatory Visit

## 2024-03-10 ENCOUNTER — Other Ambulatory Visit (HOSPITAL_COMMUNITY)
Admission: RE | Admit: 2024-03-10 | Discharge: 2024-03-10 | Disposition: A | Discharge status: Home / Self Care | Source: Ambulatory Visit | Attending: Obstetrics & Gynecology | Admitting: Obstetrics & Gynecology

## 2024-03-10 VITALS — BP 139/86 | HR 98

## 2024-03-10 DIAGNOSIS — N760 Acute vaginitis: Secondary | ICD-10-CM | POA: Insufficient documentation

## 2024-03-10 DIAGNOSIS — B9689 Other specified bacterial agents as the cause of diseases classified elsewhere: Secondary | ICD-10-CM | POA: Diagnosis not present

## 2024-03-10 NOTE — Progress Notes (Signed)
 SUBJECTIVE:  23 y.o. female complains of white vaginal discharge for 2 day(s). Pt previously had BV, treated. Noticed small chunky discharge, wants to make sure she does not have BV or yeast currently. Denies abnormal vaginal bleeding or significant pelvic pain or fever. No UTI symptoms. Denies history of known exposure to STD.  Patient's last menstrual period was 01/30/2024.  OBJECTIVE:  She appears well, afebrile. Urine dipstick: not done.  ASSESSMENT:  Vaginal Discharge  Vaginal Odor   PLAN:  GC, chlamydia, trichomonas, BVAG, CVAG probe sent to lab. Treatment: To be determined once lab results are received ROV prn if symptoms persist or worsen.\

## 2024-03-11 ENCOUNTER — Encounter: Payer: Self-pay | Admitting: Obstetrics and Gynecology

## 2024-03-11 ENCOUNTER — Other Ambulatory Visit: Payer: Self-pay

## 2024-03-11 LAB — CERVICOVAGINAL ANCILLARY ONLY
Bacterial Vaginitis (gardnerella): POSITIVE — AB
Candida Glabrata: NEGATIVE
Candida Vaginitis: NEGATIVE
Chlamydia: NEGATIVE
Comment: NEGATIVE
Comment: NEGATIVE
Comment: NEGATIVE
Comment: NEGATIVE
Comment: NEGATIVE
Comment: NORMAL
Neisseria Gonorrhea: NEGATIVE
Trichomonas: NEGATIVE

## 2024-03-11 MED ORDER — METRONIDAZOLE 0.75 % VA GEL: 1.0000 | Freq: Every day | VAGINAL | 1 refills | Status: DC

## 2024-03-11 NOTE — Progress Notes (Signed)
 Metrogel sent in for BV per protocol.

## 2024-03-13 ENCOUNTER — Ambulatory Visit
Admission: RE | Admit: 2024-03-13 | Discharge: 2024-03-13 | Disposition: A | Discharge status: Home / Self Care | Payer: Medicaid Other | Source: Ambulatory Visit | Attending: Neurology | Admitting: Neurology

## 2024-03-13 DIAGNOSIS — R519 Headache, unspecified: Secondary | ICD-10-CM

## 2024-03-13 MED ORDER — GADOPICLENOL 0.5 MMOL/ML IV SOLN
9.0000 mL | Freq: Once | INTRAVENOUS | Status: AC | PRN
  Administered 2024-03-13: 9 mL via INTRAVENOUS

## 2024-03-19 ENCOUNTER — Other Ambulatory Visit (HOSPITAL_COMMUNITY)
Admission: RE | Admit: 2024-03-19 | Discharge: 2024-03-19 | Disposition: A | Discharge status: Home / Self Care | Source: Ambulatory Visit | Attending: Obstetrics and Gynecology | Admitting: Obstetrics and Gynecology

## 2024-03-19 ENCOUNTER — Ambulatory Visit

## 2024-03-19 ENCOUNTER — Ambulatory Visit (INDEPENDENT_AMBULATORY_CARE_PROVIDER_SITE_OTHER)

## 2024-03-19 DIAGNOSIS — N898 Other specified noninflammatory disorders of vagina: Secondary | ICD-10-CM

## 2024-03-19 NOTE — Progress Notes (Signed)
 SUBJECTIVE:  23 y.o. female here for self swab to make sure that BV has gone after treatment. Not experiencing any current symptoms.  Denies abnormal vaginal bleeding or significant pelvic pain or fever. No UTI symptoms. Denies history of known exposure to STD.  Patient's last menstrual period was 01/30/2024.  OBJECTIVE:  She appears well, afebrile. Urine dipstick: not done.  ASSESSMENT:  No current symptoms   PLAN:  GC, chlamydia, trichomonas, BVAG, CVAG probe sent to lab. Treatment: To be determined once lab results are received ROV prn if symptoms persist or worsen.

## 2024-03-23 LAB — CERVICOVAGINAL ANCILLARY ONLY
Bacterial Vaginitis (gardnerella): NEGATIVE
Candida Glabrata: NEGATIVE
Candida Vaginitis: NEGATIVE
Chlamydia: NEGATIVE
Comment: NEGATIVE
Comment: NEGATIVE
Comment: NEGATIVE
Comment: NEGATIVE
Comment: NEGATIVE
Comment: NORMAL
Neisseria Gonorrhea: NEGATIVE
Trichomonas: NEGATIVE

## 2024-03-24 ENCOUNTER — Encounter: Payer: Self-pay | Admitting: Obstetrics and Gynecology

## 2024-03-24 ENCOUNTER — Other Ambulatory Visit: Payer: Self-pay | Admitting: Neurology

## 2024-03-24 ENCOUNTER — Encounter: Payer: Self-pay | Admitting: Neurology

## 2024-03-24 DIAGNOSIS — R519 Headache, unspecified: Secondary | ICD-10-CM

## 2024-04-02 NOTE — Discharge Instructions (Signed)

## 2024-04-03 ENCOUNTER — Inpatient Hospital Stay
Admission: RE | Admit: 2024-04-03 | Discharge: 2024-04-03 | Disposition: A | Discharge status: Home / Self Care | Source: Ambulatory Visit | Attending: Neurology | Admitting: Neurology

## 2024-04-08 ENCOUNTER — Ambulatory Visit

## 2024-04-08 NOTE — Discharge Instructions (Signed)

## 2024-04-09 ENCOUNTER — Ambulatory Visit
Admission: RE | Admit: 2024-04-09 | Discharge: 2024-04-09 | Disposition: A | Discharge status: Home / Self Care | Source: Ambulatory Visit | Attending: Neurology | Admitting: Neurology

## 2024-04-09 ENCOUNTER — Encounter: Payer: Self-pay | Admitting: Neurology

## 2024-04-09 ENCOUNTER — Other Ambulatory Visit (HOSPITAL_COMMUNITY)
Admission: RE | Admit: 2024-04-09 | Discharge: 2024-04-09 | Disposition: A | Discharge status: Home / Self Care | Source: Ambulatory Visit | Attending: Obstetrics and Gynecology | Admitting: Obstetrics and Gynecology

## 2024-04-09 ENCOUNTER — Ambulatory Visit (INDEPENDENT_AMBULATORY_CARE_PROVIDER_SITE_OTHER)

## 2024-04-09 VITALS — BP 119/82 | HR 80

## 2024-04-09 DIAGNOSIS — N898 Other specified noninflammatory disorders of vagina: Secondary | ICD-10-CM | POA: Diagnosis present

## 2024-04-09 DIAGNOSIS — R519 Headache, unspecified: Secondary | ICD-10-CM

## 2024-04-09 NOTE — Progress Notes (Signed)
..  SUBJECTIVE:  23 y.o. female complains of vaginal irritation for 4 day(s). Denies abnormal vaginal bleeding or significant pelvic pain or fever. No UTI symptoms. Denies history of known exposure to STD.  No LMP recorded.  OBJECTIVE:  She appears well, afebrile. Urine dipstick: not done.  ASSESSMENT:  Vaginal Irritation     PLAN:  GC, chlamydia, trichomonas, BVAG, CVAG probe sent to lab. Treatment: To be determined once lab results are received ROV prn if symptoms persist or worsen.

## 2024-04-14 LAB — CERVICOVAGINAL ANCILLARY ONLY
Bacterial Vaginitis (gardnerella): NEGATIVE
Candida Glabrata: NEGATIVE
Candida Vaginitis: NEGATIVE
Chlamydia: NEGATIVE
Comment: NEGATIVE
Comment: NEGATIVE
Comment: NEGATIVE
Comment: NEGATIVE
Comment: NEGATIVE
Comment: NORMAL
Neisseria Gonorrhea: NEGATIVE
Trichomonas: NEGATIVE

## 2024-04-22 NOTE — Discharge Instructions (Signed)

## 2024-04-24 ENCOUNTER — Ambulatory Visit
Admission: RE | Admit: 2024-04-24 | Discharge: 2024-04-24 | Disposition: A | Discharge status: Home / Self Care | Source: Ambulatory Visit | Attending: Neurology | Admitting: Neurology

## 2024-04-24 VITALS — BP 124/68 | HR 74

## 2024-04-24 DIAGNOSIS — R519 Headache, unspecified: Secondary | ICD-10-CM

## 2024-04-24 LAB — CSF CELL COUNT WITH DIFFERENTIAL
RBC Count, CSF: 0 {cells}/uL
TOTAL NUCLEATED CELL: 0 {cells}/uL (ref 0–5)

## 2024-04-24 LAB — GLUCOSE, CSF: Glucose, CSF: 61 mg/dL (ref 40–80)

## 2024-04-24 LAB — PROTEIN, CSF: Total Protein, CSF: 25 mg/dL (ref 15–45)

## 2024-04-27 ENCOUNTER — Telehealth: Payer: Self-pay

## 2024-04-27 ENCOUNTER — Other Ambulatory Visit: Payer: Self-pay

## 2024-04-27 ENCOUNTER — Emergency Department (HOSPITAL_BASED_OUTPATIENT_CLINIC_OR_DEPARTMENT_OTHER)
Admission: EM | Admit: 2024-04-27 | Discharge: 2024-04-28 | Disposition: A | Discharge status: Home / Self Care | Attending: Emergency Medicine | Admitting: Emergency Medicine

## 2024-04-27 ENCOUNTER — Encounter (HOSPITAL_BASED_OUTPATIENT_CLINIC_OR_DEPARTMENT_OTHER): Payer: Self-pay

## 2024-04-27 ENCOUNTER — Ambulatory Visit: Payer: Self-pay

## 2024-04-27 DIAGNOSIS — G971 Other reaction to spinal and lumbar puncture: Secondary | ICD-10-CM | POA: Diagnosis not present

## 2024-04-27 DIAGNOSIS — R519 Headache, unspecified: Secondary | ICD-10-CM | POA: Diagnosis present

## 2024-04-27 MED ORDER — LACTATED RINGERS IV BOLUS
1000.0000 mL | Freq: Once | INTRAVENOUS | Status: AC
  Administered 2024-04-27: 1000 mL via INTRAVENOUS

## 2024-04-27 MED ORDER — KETOROLAC TROMETHAMINE 30 MG/ML IJ SOLN
30.0000 mg | Freq: Once | INTRAMUSCULAR | Status: AC
  Administered 2024-04-27: 30 mg via INTRAVENOUS
  Filled 2024-04-27: qty 1

## 2024-04-27 MED ORDER — PROCHLORPERAZINE EDISYLATE 10 MG/2ML IJ SOLN
10.0000 mg | Freq: Once | INTRAMUSCULAR | Status: AC
  Administered 2024-04-27: 10 mg via INTRAVENOUS
  Filled 2024-04-27: qty 2

## 2024-04-27 NOTE — ED Provider Notes (Signed)
 [place] EMERGENCY DEPARTMENT AT [facility]  Provider Note  CSN: [id number] Arrival date & time: 04/27/24 1924  History Chief Complaint  Patient presents with   Headache    Andrea Archer is a 23 y.o. female here for headache for the last 2 days since she had a lumbar puncture as part of a work up for headaches. She reports those headaches were mild and intermittent but since LP, her pain has worsened and been more constant. Associated with photophobia and worse with sitting up.    Home Medications Prior to Admission medications   Not on File     Allergies    Other, Mushroom, and Pineapple   Review of Systems   Review of Systems Please see HPI for pertinent positives and negatives  Physical Exam BP 125/70   Pulse 80   Temp 98.3 F (36.8 C)   Resp 18   LMP 04/27/2024   SpO2 100%   Breastfeeding Yes   Physical Exam Vitals and nursing note reviewed.  Constitutional:      Appearance: Normal appearance.  HENT:     Head: Normocephalic and atraumatic.     Nose: Nose normal.     Mouth/Throat:     Mouth: Mucous membranes are moist.  Eyes:     Extraocular Movements: Extraocular movements intact.     Conjunctiva/sclera: Conjunctivae normal.  Cardiovascular:     Rate and Rhythm: Normal rate.  Pulmonary:     Effort: Pulmonary effort is normal.     Breath sounds: Normal breath sounds.  Abdominal:     General: Abdomen is flat.     Palpations: Abdomen is soft.     Tenderness: There is no abdominal tenderness.  Musculoskeletal:        General: No swelling. Normal range of motion.     Cervical back: Neck supple.  Skin:    General: Skin is warm and dry.  Neurological:     General: No focal deficit present.     Mental Status: She is alert.  Psychiatric:        Mood and Affect: Mood normal.     ED Results / Procedures / Treatments   EKG None  Procedures Procedures  Medications Ordered in the ED Medications  ketorolac  (TORADOL ) 30 MG/ML injection  30 mg (30 mg Intravenous Given 04/27/24 2334)  prochlorperazine (COMPAZINE) injection 10 mg (10 mg Intravenous Given 04/27/24 2335)  lactated ringers  bolus 1,000 mL (0 mLs Intravenous Stopped 04/28/24 0026)    Initial Impression and Plan  Patient here with post-LP headache. Neuro exam is grossly normal. She was brought here by EMS. Advised we do not have IV caffeine  or blood patch available here, but we will give her IVF and headache cocktail to see if we can get her some relief.   ED Course   Clinical Course as of 04/28/24 0307  Tue Apr 28, 2024  0017 Patient feeling better, headache improved and she is able to walk without difficulty. She is ready to go home. Recommend she contact Imaging Center in the AM if her headache returns to discuss blood patch. In the meantime, maintain hydration, caffeine  may be helpful. RTED for any other concerns.  [CS]    Clinical Course User Index [CS] Charmayne Cooper, MD     MDM Rules/Calculators/A&P Medical Decision Making Problems Addressed: Post lumbar puncture headache: acute illness or injury  Risk Prescription drug management.     Final Clinical Impression(s) / ED Diagnoses Final diagnoses:  Post lumbar  puncture headache    Rx / DC Orders ED Discharge Orders     None        Charmayne Cooper, MD 04/28/24 [phone]

## 2024-04-27 NOTE — Telephone Encounter (Signed)
  Chief Complaint: headache Symptoms: headache, pain Frequency: constant Pertinent Negatives: Patient denies did not go into detail due to recent lumbar puncture Disposition: [x] ED /[] Urgent Care (no appt availability in office) / [] Appointment(In office/virtual)/ []  [facility]/ [] Home Care/ [] Refused Recommended Disposition /[] [facility]/ []  Follow-up with PCP Additional Notes:  Calling for severe headache is worsening. She is tearful on the call. Lumbar puncture performed on Friday. Emergency room evaluation advised.   Copied from CRM [id number]. Topic: Clinical - Red Word Triage >> Apr 27, 2024  5:36 PM Leory Rands wrote: Red Word that prompted transfer to Nurse Triage: Patient is calling to report that she is having a spinal headache since Friday. Patient report last Friday she had a lumbar puncture. And since then the pain is getting worse. Back pain, vomitting. Rigth side of neck is  painful. Sensitive to light. right side of neck Would like to know what to do. Reason for Disposition  [1] SEVERE headache (e.g., excruciating) AND [2] "worst headache" of life  Protocols used: Headache-A-AH

## 2024-04-27 NOTE — ED Notes (Addendum)
 Spoke with provider and CN.Pt requested a bed. This RN told patient that at this time unfortunately there is a wait and we are not able to provider a bed. Pt states she needs to lay down and her headache is a 100 when she is sitting. This RN spoke with CN and patient was placed in resp wait room with recliner and given coke to drink. Pt reports her headache is better laying down in the recliner. Pt is resting at this time. Will continue to monitor. At this time provider states no labs or imagining need to be ordered.

## 2024-04-27 NOTE — Progress Notes (Signed)
 Please call and advise the patient that the recent lumbar puncture did not show evidence of increase intracranial pressure.  No further action is required on this test at this time. Please remind patient to keep any upcoming appointments or tests and to call us  with any interim questions, concerns, problems or updates. Thanks,   Cassandra Cleveland, MD

## 2024-04-27 NOTE — Telephone Encounter (Signed)
 Called and spoke to pt and gave lp results she stated that she has the spinal ha and can't sit up, strong back pain on right side and swollen. Routing to Dr. Samara Crest to advise on next steps.

## 2024-04-27 NOTE — ED Triage Notes (Addendum)
 Pt reports she is here today due to headache after receiving a lumbar puncture . Pt reports she gave birth back in December and started having headaches. She states they did the spinal tap Friday and on Saturday she developed a headache. Pt reports 10/10 headache. Pt reports she is unable to sit up in triage.

## 2024-05-05 ENCOUNTER — Telehealth: Payer: Self-pay | Admitting: Neurology

## 2024-05-05 NOTE — Telephone Encounter (Signed)
 Having really strong headaches after the lumbar puncture procedure. Drinking coffee for caffeine , lying down, was taking Tylenol  (not working). Would like a call back to discuss if can prescribe a medication.  FYI if get a call from number not in my phone; call will be declined, call back and call will go through.

## 2024-05-05 NOTE — Telephone Encounter (Signed)
 Call to patient who reports that she is having positional headaches since LP on 4/25. She has been taking tylenol  with no relief. She is no longer breastfeeding. She was also seen in the ED on 4/28 for post LP headache. She was advised to contact the imaging center to discuss a blood patch. She has been maintaining fluid intake and drinking caffeine  with no some relief. She does not think a blood patch is necessary and would like medication for headaches. I advised that she is describing post LP headaches and blood patch is typical treatment but will let Dr. Samara Crest review.

## 2024-05-06 ENCOUNTER — Other Ambulatory Visit: Payer: Self-pay | Admitting: Neurology

## 2024-05-06 ENCOUNTER — Other Ambulatory Visit (HOSPITAL_COMMUNITY)
Admission: RE | Admit: 2024-05-06 | Discharge: 2024-05-06 | Disposition: A | Discharge status: Home / Self Care | Source: Ambulatory Visit | Attending: Physician Assistant | Admitting: Physician Assistant

## 2024-05-06 ENCOUNTER — Ambulatory Visit (INDEPENDENT_AMBULATORY_CARE_PROVIDER_SITE_OTHER): Admitting: Physician Assistant

## 2024-05-06 VITALS — BP 131/88 | HR 107 | Ht 67.0 in | Wt 181.2 lb

## 2024-05-06 DIAGNOSIS — N898 Other specified noninflammatory disorders of vagina: Secondary | ICD-10-CM

## 2024-05-06 MED ORDER — BUTALBITAL-APAP-CAFFEINE 50-325-40 MG PO TABS: 1.0000 | ORAL_TABLET | Freq: Two times a day (BID) | ORAL | 0 refills | Status: AC | PRN

## 2024-05-06 NOTE — Progress Notes (Signed)
 Pt presents for yeast and bv symptoms.

## 2024-05-06 NOTE — Progress Notes (Signed)
 GYNECOLOGY  VISIT   HPI: Andrea Archer is a 23 y.o.   Single  female  [phone] here for on and off itching sensation starting one week ago with white mucousy discharge. Attempted to treat symptoms with boric acid. 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 She never had an outbreak to her knowledge, took valtrex  as prophylaxis last pregnancy.  She denies fever, n/v, vaginal pain/ irritation, malodorous discharge.   GYNECOLOGIC HISTORY: Patient's last menstrual period was 04/27/2024. 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   History of gestational hypertension 09/05/2023   Pyelonephritis affecting pregnancy 09/05/2023   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   No current facility-administered medications for this visit.      ALLERGIES: Other, Mushroom, and Pineapple  Family History  Problem Relation Age of Onset   Cancer Mother        cervical ca 2023   Hypertension Mother    Hypertension Father    Heart disease Father    Sickle cell anemia Father    Sickle cell trait Father        has sickle cell   Diabetes Maternal Grandmother    Hypertension Maternal Grandmother    Heart disease Maternal Grandmother    Fibromyalgia Maternal Grandmother    Hypertension Maternal Grandfather    Pancreatic cancer Maternal Grandfather    Hypertension Paternal Grandmother    Hypertension Paternal Grandfather    Colon cancer Neg Hx    Stomach cancer Neg Hx    Esophageal cancer Neg Hx    Rectal cancer Neg Hx     Social History   Socioeconomic History   Marital status: Single    Spouse name: Not on file   Number of children: Not on file   Years of education: Not on file   Highest education level: Not on file  Occupational History   Not on file  Tobacco  Use   Smoking status: Former    Types: Cigars    Passive exposure: Past   Smokeless tobacco: Never   Tobacco comments:    Quit 2022  Vaping Use   Vaping status: Never Used  Substance and Sexual Activity   Alcohol use: Not Currently    Comment: occ, not since confirmed pregnancy   Drug use: No   Sexual activity: Yes    Partners: Male    Birth control/protection: None  Other Topics Concern   Not on file  Social History Narrative   Not on file   Social Drivers of Health   Financial Resource Strain: Not on File (04/19/2022)   Received from [employer], [employer] [facility]    Financial Resource Strain: 0  Food Insecurity: No Food Insecurity (12/17/2023)   Hunger Vital Sign    Worried About Running Out of Food in the Last Year: Never true    Ran Out of Food in the Last Year: Never true  Transportation Needs: No Transportation Needs (12/17/2023)   PRAPARE - Administrator, Civil Service (Medical): No    Lack of Transportation  (Non-Medical): No  Physical Activity: Not on File (04/19/2022)   Received from [place], [place]   Physical Activity    Physical Activity: 0  Stress: Not on File (04/19/2022)   Received from [facility], [place]   Stress    Stress: 0  Social Connections: Not on File (09/20/2023)   Received from [facility]   Social Connections    Connectedness: 0  Intimate Partner Violence: Not At Risk (12/17/2023)   Humiliation, Afraid, Rape, and Kick questionnaire    Fear of Current or Ex-Partner: No    Emotionally Abused: No    Physically Abused: No    Sexually Abused: No    Review of Systems  PHYSICAL EXAMINATION:    LMP 04/27/2024     General appearance: alert, cooperative and appears stated age Head: Normocephalic, without obvious abnormality, atraumatic Neck: no adenopathy, supple, symmetrical, trachea midline  Breasts: Deferred Heart: regular rate and rhythm Extremities: extremities normal, atraumatic, no cyanosis or edema Skin: Skin color, texture, turgor normal. No rashes or lesions Lymph nodes: No abnormal inguinal nodes palpated Neurologic: Grossly normal  Pelvic: External genitalia:  no lesions              Urethra:  normal appearing urethra with no masses, tenderness or lesions              Bartholins and Skenes: normal                 Vagina: normal appearing vagina with normal color and discharge, no lesions             Chaperone was present for exam  ASSESSMENT & PLAN  1. Vaginal irritation (Primary) Vaginal itching x1 week with unremarkable exam.  - Cervicovaginal ancillary only( [place])     An After Visit Summary was printed and given to the patient.  Jadavion Spoelstra E Nesreen Albano, PA-C 5/7/20257:47 AM

## 2024-05-06 NOTE — Telephone Encounter (Signed)
 Call to patient, she disagrees that headache is post LP headache and verbalized understanding recommendations but states she already did bed rest. Patient is aware Fioricet was called in but future refills will not be given. Call was disconnected when trying to schedule appt.

## 2024-05-06 NOTE — Telephone Encounter (Signed)
 We can give her a few tablets of Fioricet, but is she does not want Blood patch, then it will be strict bed rest.

## 2024-05-07 ENCOUNTER — Encounter: Payer: Self-pay | Admitting: Physician Assistant

## 2024-05-07 LAB — CERVICOVAGINAL ANCILLARY ONLY
Bacterial Vaginitis (gardnerella): NEGATIVE
Candida Glabrata: NEGATIVE
Candida Vaginitis: NEGATIVE
Chlamydia: NEGATIVE
Comment: NEGATIVE
Comment: NEGATIVE
Comment: NEGATIVE
Comment: NEGATIVE
Comment: NEGATIVE
Comment: NORMAL
Neisseria Gonorrhea: NEGATIVE
Trichomonas: NEGATIVE

## 2024-05-20 ENCOUNTER — Encounter: Payer: Self-pay | Admitting: Obstetrics

## 2024-05-20 ENCOUNTER — Ambulatory Visit: Admitting: Obstetrics

## 2024-05-20 ENCOUNTER — Other Ambulatory Visit (HOSPITAL_COMMUNITY)
Admission: RE | Admit: 2024-05-20 | Discharge: 2024-05-20 | Disposition: A | Discharge status: Home / Self Care | Source: Ambulatory Visit

## 2024-05-20 VITALS — Ht 67.0 in | Wt 182.7 lb

## 2024-05-20 DIAGNOSIS — N898 Other specified noninflammatory disorders of vagina: Secondary | ICD-10-CM

## 2024-05-20 DIAGNOSIS — Z3009 Encounter for other general counseling and advice on contraception: Secondary | ICD-10-CM | POA: Diagnosis not present

## 2024-05-20 DIAGNOSIS — Z01419 Encounter for gynecological examination (general) (routine) without abnormal findings: Secondary | ICD-10-CM

## 2024-05-20 DIAGNOSIS — E569 Vitamin deficiency, unspecified: Secondary | ICD-10-CM

## 2024-05-20 DIAGNOSIS — R102 Pelvic and perineal pain: Secondary | ICD-10-CM

## 2024-05-20 LAB — POCT URINALYSIS DIPSTICK
Bilirubin, UA: NEGATIVE
Blood, UA: NEGATIVE
Glucose, UA: NEGATIVE
Ketones, UA: NEGATIVE
Leukocytes, UA: NEGATIVE
Nitrite, UA: NEGATIVE
Protein, UA: NEGATIVE
Spec Grav, UA: 1.025 (ref 1.010–1.025)
Urobilinogen, UA: 2 U/dL — AB
pH, UA: 6 (ref 5.0–8.0)

## 2024-05-20 MED ORDER — VITAFOL GUMMIES 3.33-0.333-34.8 MG PO CHEW: 3.0000 | CHEWABLE_TABLET | Freq: Every day | ORAL | 11 refills | Status: AC

## 2024-05-20 NOTE — Progress Notes (Signed)
 Subjective:        Andrea Archer is a 23 y.o. female here for a routine exam.  Current complaints: Vaginal discharge, pelvic pain and dysuria..    Personal health questionnaire:  Is patient [name], have a family history of breast and/or ovarian cancer: no Is there a family history of uterine cancer diagnosed at age < 26, gastrointestinal cancer, urinary tract cancer, family member who is a Personnel officer syndrome-associated carrier: yes Is the patient overweight and hypertensive, family history of diabetes, personal history of gestational diabetes, preeclampsia or PCOS: no Is patient over 2, have PCOS,  family history of premature CHD under age 75, diabetes, smoke, have hypertension or peripheral artery disease:  no At any time, has a partner hit, kicked or otherwise hurt or frightened you?: no Over the past 2 weeks, have you felt down, depressed or hopeless?: no Over the past 2 weeks, have you felt little interest or pleasure in doing things?:no   Gynecologic History Patient's last menstrual period was 04/27/2024 (exact date). Contraception: none Last Pap: 2023. Results were: normal Last mammogram: n/a. Results were: n/a  Obstetric History OB History  Gravida Para Term Preterm AB Living  4 2 2  0 1 2  SAB IAB Ectopic Multiple Live Births  1 0 0 0 2    # Outcome Date GA Lbr Len/2nd Weight Sex Type Anes PTL Lv  4 Current           3 Term 12/17/23 [redacted]w[redacted]d  7 lb 7.6 oz (3.39 kg) M Vag-Spont None  LIV     Birth Comments: birth mark below right nipple  2 Term 11/16/19 [redacted]w[redacted]d 09:34 / 02:57 7 lb 3.9 oz (3.286 kg) F Vag-Spont EPI  LIV  1 SAB             Past Medical History:  Diagnosis Date   Anemia    Anxiety    Bacterial vaginitis    Depression    Hypertension    with pregnancy   Pregnant    Sickle cell trait (HCC)    UTI (urinary tract infection)     Past Surgical History:  Procedure Laterality Date   FINGER SURGERY Left    Pinky Finger   FRACTURE SURGERY  01/09/2019    left pinky finger     Current Outpatient Medications:    Prenatal Vit-Fe Phos-FA-Omega (VITAFOL  GUMMIES) 3.33-0.333-34.8 MG CHEW, Chew 3 tablets by mouth daily before breakfast., Disp: 90 tablet, Rfl: 11   butalbital -acetaminophen -caffeine  (FIORICET) 50-325-40 MG tablet, Take 1 tablet by mouth every 12 (twelve) hours as needed for headache. (Patient not taking: Reported on 05/20/2024), Disp: 10 tablet, Rfl: 0 Allergies  Allergen Reactions   Other     Henna - rash   Mushroom Rash   Pineapple Rash    Social History   Tobacco Use   Smoking status: Former    Types: Cigars    Passive exposure: Past   Smokeless tobacco: Never   Tobacco comments:    Quit 2022  Substance Use Topics   Alcohol use: Not Currently    Comment: occ, not since confirmed pregnancy    Family History  Problem Relation Age of Onset   Cancer Mother        cervical ca 2023   Hypertension Mother    Hypertension Father    Heart disease Father    Sickle cell anemia Father    Sickle cell trait Father        has sickle cell  Diabetes Maternal Grandmother    Hypertension Maternal Grandmother    Heart disease Maternal Grandmother    Fibromyalgia Maternal Grandmother    Hypertension Maternal Grandfather    Pancreatic cancer Maternal Grandfather    Hypertension Paternal Grandmother    Hypertension Paternal Grandfather    Colon cancer Neg Hx    Stomach cancer Neg Hx    Esophageal cancer Neg Hx    Rectal cancer Neg Hx       Review of Systems  Constitutional: negative for fatigue and weight loss Respiratory: negative for cough and wheezing Cardiovascular: negative for chest pain, fatigue and palpitations Gastrointestinal: negative for abdominal pain and change in bowel habits Musculoskeletal: positive for myalgias backache Neurological: negative for gait problems and tremors Behavioral/Psych: negative for abusive relationship, depression Endocrine: negative for temperature intolerance    Genitourinary:  positive for vaginal discharge, pelvic pain and dysuria.  negative for abnormal menstrual periods, genital lesions, hot flashes, sexual problems  Integument/breast: negative for breast lump, breast tenderness, nipple discharge and skin lesion(s)    Objective:       Ht 5\' 7"  (1.702 m)   Wt 182 lb 11.2 oz (82.9 kg)   LMP 04/27/2024 (Exact Date)   BMI 28.61 kg/m  General:   Alert and no distress  Skin:   no rash or abnormalities  Lungs:   clear to auscultation bilaterally  Heart:   regular rate and rhythm, S1, S2 normal, no murmur, click, rub or gallop  Breasts:   normal without suspicious masses, skin or nipple changes or axillary nodes  Abdomen:  normal findings: no organomegaly, soft, non-tender and no hernia  Pelvis:  External genitalia: normal general appearance Urinary system: urethral meatus normal and bladder without fullness, nontender Vaginal: normal without tenderness, induration or masses Cervix: normal appearance Adnexa: normal bimanual exam Uterus: anteverted and non-tender, normal size   Lab Review Urine pregnancy test Labs reviewed yes Radiologic studies reviewed no  I have spent a total of 20 minutes of face-to-face time, excluding clinical staff time, reviewing notes and preparing to see patient, ordering tests and/or medications, and counseling the patient.   Assessment:    1. Encounter for gynecological examination with Papanicolaou smear of cervix (Primary) Rx: - Cytology - PAP( [place]) - Urine Culture - POCT Urinalysis Dipstick  2. Vaginal discharge Rx: - Cervicovaginal ancillary only( [place])  3. Encounter for counseling regarding contraception - declines contraception.  Using condoms.  4. Vitamin deficiency Rx: - Prenatal Vit-Fe Phos-FA-Omega (VITAFOL  GUMMIES) 3.33-0.333-34.8 MG CHEW; Chew 3 tablets by mouth daily before breakfast.  Dispense: 90 tablet; Refill: 11  5. Pelvic pain Rx: - US  PELVIC COMPLETE WITH TRANSVAGINAL; Future      Plan:    Education reviewed: calcium  supplements, depression evaluation, low fat, low cholesterol diet, safe sex/STD prevention, self breast exams, and weight bearing exercise. Contraception: condoms. Follow up in: 1 year.  MyChart virtual follow up in 2 weeks for ultrasound results.  Meds ordered this encounter  Medications   Prenatal Vit-Fe Phos-FA-Omega (VITAFOL  GUMMIES) 3.33-0.333-34.8 MG CHEW    Sig: Chew 3 tablets by mouth daily before breakfast.    Dispense:  90 tablet    Refill:  11   Orders Placed This Encounter  Procedures   Urine Culture   US  PELVIC COMPLETE WITH TRANSVAGINAL    Standing Status:   Future    Expiration Date:   05/20/2025    Reason for Exam (SYMPTOM  OR DIAGNOSIS REQUIRED):   Pelvic pain  Preferred imaging location?:   [facility] Ultrasound   POCT Urinalysis Dipstick     Gabrielle Joiner, MD, FACOG Attending Obstetrician & Gynecologist, [facility] for [facility], [facility], [place] 05/20/2024

## 2024-05-20 NOTE — Progress Notes (Signed)
 Pt presents for annual. Pt is having vaginal irritation and abdominal pain.

## 2024-05-21 LAB — CERVICOVAGINAL ANCILLARY ONLY
Bacterial Vaginitis (gardnerella): POSITIVE — AB
Candida Glabrata: NEGATIVE
Candida Vaginitis: NEGATIVE
Chlamydia: NEGATIVE
Comment: NEGATIVE
Comment: NEGATIVE
Comment: NEGATIVE
Comment: NEGATIVE
Comment: NEGATIVE
Comment: NORMAL
Neisseria Gonorrhea: NEGATIVE
Trichomonas: NEGATIVE

## 2024-05-22 ENCOUNTER — Ambulatory Visit (INDEPENDENT_AMBULATORY_CARE_PROVIDER_SITE_OTHER): Admitting: Physician Assistant

## 2024-05-22 ENCOUNTER — Other Ambulatory Visit: Payer: Self-pay

## 2024-05-22 ENCOUNTER — Ambulatory Visit: Payer: Self-pay | Admitting: Obstetrics

## 2024-05-22 DIAGNOSIS — S73191A Other sprain of right hip, initial encounter: Secondary | ICD-10-CM | POA: Diagnosis not present

## 2024-05-22 DIAGNOSIS — B9689 Other specified bacterial agents as the cause of diseases classified elsewhere: Secondary | ICD-10-CM

## 2024-05-22 DIAGNOSIS — M25551 Pain in right hip: Secondary | ICD-10-CM

## 2024-05-22 DIAGNOSIS — M2550 Pain in unspecified joint: Secondary | ICD-10-CM | POA: Diagnosis not present

## 2024-05-22 DIAGNOSIS — S73199A Other sprain of unspecified hip, initial encounter: Secondary | ICD-10-CM | POA: Insufficient documentation

## 2024-05-22 MED ORDER — METRONIDAZOLE 0.75 % VA GEL: 1.0000 | Freq: Every day | VAGINAL | 1 refills | Status: DC

## 2024-05-22 MED ORDER — METRONIDAZOLE 500 MG PO TABS: 500.0000 mg | ORAL_TABLET | Freq: Two times a day (BID) | ORAL | 2 refills | Status: AC

## 2024-05-22 NOTE — Addendum Note (Signed)
 Addended by: Georgann Kim on: 05/22/2024 10:34 AM   Modules accepted: Orders

## 2024-05-22 NOTE — Progress Notes (Signed)
Flagyl Rx for BV 

## 2024-05-22 NOTE — Progress Notes (Signed)
 Office Visit Note   Patient: Andrea Archer           Date of Birth: May 28, 2001           MRN: [id number] Visit Date: 05/22/2024              Requested by: Andrea Prophet, PA-C [address] [place],  [place] [address] PCP: Pcp, No   Assessment & Plan: Visit Diagnoses:  1. Pain in right hip   2. Polyarthralgia   3. Tear of right acetabular labrum, initial encounter     Plan: Andrea Archer is a very pleasant 23 year old woman who comes in today with 2 complaints.  She has first complaint of polyarthralgia for 7 years.  She does have sickle cell trait but does not have any family history particularly of inflammatory disease.  She did have inflammatory markers drawn by her primary care all were negative with the exception of her vitamin D which was low at 23.  She is currently supplementing on that.  She denies any traumatic reasons for her polyarthralgia effects all of her joints.  It limits her quite a bit.  She said she used to enjoy sports including gymnastics but had to give all this up as a teenager because she began having pain in all of her joints.  She also is complaining today of right hip catching and popping.  This has been going on as well several years.  She has tried anti-inflammatories as well as physical therapy without relief in her symptoms.  I do think given her polyarthralgia and her being young and the time it has been going on it is worth a referral to rheumatology to see if there is any other underlying cause for this that can be evaluated.  Questionable whether it could be related to sickle cell trait which can have joint Pain in rare cases but again not my expertise.  Also will refer her given the length of time in her increasing symptoms of popping in her right hip for MRI arthrogram of her right hip if that showed any significant labral pathology would refer her to Dr. Hermina Archer  Follow-Up Instructions: To rheumatology and MRI  Orders:  Orders Placed This Encounter  Procedures    XR HIP UNILAT W OR W/O PELVIS 2-3 VIEWS RIGHT   No orders of the defined types were placed in this encounter.     Procedures: No procedures performed   Clinical Data: No additional findings.   Subjective: Chief Complaint  Patient presents with   New Patient (Initial Visit)    Multiple joint pain     HPI patient is a 23 year old woman with a history of multiple joint pain since she was 50.  She recently had a inflammatory lab workup.  Showed low vitamin D she is now taking over-the-counter vitamin D.  She complains of right hip pain and popping she had a lumbar puncture in 2020 for  Review of Systems  All other systems reviewed and are negative.    Objective: Vital Signs: LMP 04/27/2024 (Exact Date)   Physical Exam Constitutional:      Appearance: Normal appearance.  Pulmonary:     Effort: Pulmonary effort is normal.  Skin:    General: Skin is warm and dry.  Neurological:     Mental Status: She is alert.  Psychiatric:        Mood and Affect: Mood normal.        Behavior: Behavior normal.     Ortho  Exam Examination she has no hypermobility no redness no swelling in her joints.  She does have right hip pain specifically in the groin she does have reproducible popping and clicking that is painful with external and internal rotation.  She is neurovascularly intact her strength is intact negative straight leg raise Specialty Comments:  No specialty comments available.  Imaging: No results found.   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 08/29/2023   Rubella non-immune status, antepartum 05/13/2023   Supervision  of other normal pregnancy, antepartum 05/10/2023   Sickle cell trait (HCC) 05/04/2019   Past Medical History:  Diagnosis Date   Anemia    Anxiety    Bacterial vaginitis    Depression    Hypertension    with pregnancy   Pregnant    Sickle cell trait (HCC)    UTI (urinary tract infection)     Family History  Problem Relation Age of Onset   Cancer Mother        cervical ca 2023   Hypertension Mother    Hypertension Father    Heart disease Father    Sickle cell anemia Father    Sickle cell trait Father        has sickle cell   Diabetes Maternal Grandmother    Hypertension Maternal Grandmother    Heart disease Maternal Grandmother    Fibromyalgia Maternal Grandmother    Hypertension Maternal Grandfather    Pancreatic cancer Maternal Grandfather    Hypertension Paternal Grandmother    Hypertension Paternal Grandfather    Colon cancer Neg Hx    Stomach cancer Neg Hx    Esophageal cancer Neg Hx    Rectal cancer Neg Hx     Past Surgical History:  Procedure Laterality Date   FINGER SURGERY Left    Pinky Finger   FRACTURE SURGERY  01/09/2019   left pinky finger   Social History   Occupational History   Not on file  Tobacco Use   Smoking status: Former    Types: Cigars    Passive exposure: Past   Smokeless tobacco: Never   Tobacco comments:    Quit 2022  Vaping Use   Vaping status: Never Used  Substance and Sexual Activity   Alcohol use: Not Currently    Comment: occ, not since confirmed pregnancy   Drug use: No   Sexual activity: Yes    Partners: Male    Birth control/protection: None

## 2024-05-25 LAB — URINE CULTURE

## 2024-05-27 LAB — CYTOLOGY - PAP: Diagnosis: NEGATIVE

## 2024-05-29 ENCOUNTER — Ambulatory Visit (HOSPITAL_COMMUNITY): Admission: RE | Admit: 2024-05-29 | Source: Ambulatory Visit

## 2024-06-03 ENCOUNTER — Other Ambulatory Visit (HOSPITAL_COMMUNITY)
Admission: RE | Admit: 2024-06-03 | Discharge: 2024-06-03 | Disposition: A | Discharge status: Home / Self Care | Source: Ambulatory Visit | Attending: Obstetrics | Admitting: Obstetrics

## 2024-06-03 ENCOUNTER — Ambulatory Visit (INDEPENDENT_AMBULATORY_CARE_PROVIDER_SITE_OTHER)

## 2024-06-03 ENCOUNTER — Ambulatory Visit (HOSPITAL_COMMUNITY)
Admission: RE | Admit: 2024-06-03 | Discharge: 2024-06-03 | Disposition: A | Discharge status: Home / Self Care | Source: Ambulatory Visit | Attending: Obstetrics | Admitting: Obstetrics

## 2024-06-03 VITALS — BP 126/76 | HR 78

## 2024-06-03 DIAGNOSIS — N760 Acute vaginitis: Secondary | ICD-10-CM | POA: Diagnosis not present

## 2024-06-03 DIAGNOSIS — B9689 Other specified bacterial agents as the cause of diseases classified elsewhere: Secondary | ICD-10-CM

## 2024-06-03 DIAGNOSIS — R102 Pelvic and perineal pain: Secondary | ICD-10-CM | POA: Insufficient documentation

## 2024-06-03 NOTE — Progress Notes (Signed)
 SUBJECTIVE:  23 y.o. female who desires a STI screen. Denies abnormal vaginal discharge, bleeding or significant pelvic pain. No UTI symptoms. Denies history of known exposure to STD.  Patient's last menstrual period was 04/27/2024 (exact date).  OBJECTIVE:  She appears well.   ASSESSMENT:  STI Screen  Pt wanting to follow up to make sure BV as been treated effectively.   PLAN:  Pt offered STI blood screening-not indicated GC, chlamydia, and trichomonas probe sent to lab.  Treatment: To be determined once lab results are received.  Pt follow up as needed.

## 2024-06-04 LAB — CERVICOVAGINAL ANCILLARY ONLY
Bacterial Vaginitis (gardnerella): NEGATIVE
Candida Glabrata: NEGATIVE
Candida Vaginitis: NEGATIVE
Chlamydia: NEGATIVE
Comment: NEGATIVE
Comment: NEGATIVE
Comment: NEGATIVE
Comment: NEGATIVE
Comment: NEGATIVE
Comment: NORMAL
Neisseria Gonorrhea: NEGATIVE
Trichomonas: NEGATIVE

## 2024-07-07 ENCOUNTER — Encounter: Payer: Self-pay | Admitting: Physician Assistant

## 2024-07-07 ENCOUNTER — Encounter: Payer: Self-pay | Admitting: Orthopedic Surgery

## 2024-07-10 ENCOUNTER — Other Ambulatory Visit

## 2024-07-11 ENCOUNTER — Other Ambulatory Visit: Payer: Self-pay

## 2024-07-11 ENCOUNTER — Encounter (HOSPITAL_BASED_OUTPATIENT_CLINIC_OR_DEPARTMENT_OTHER): Payer: Self-pay

## 2024-07-11 ENCOUNTER — Emergency Department (HOSPITAL_BASED_OUTPATIENT_CLINIC_OR_DEPARTMENT_OTHER): Admission: EM | Admit: 2024-07-11 | Discharge: 2024-07-11 | Disposition: A | Discharge status: Home / Self Care

## 2024-07-11 ENCOUNTER — Emergency Department (HOSPITAL_BASED_OUTPATIENT_CLINIC_OR_DEPARTMENT_OTHER)

## 2024-07-11 DIAGNOSIS — R0781 Pleurodynia: Secondary | ICD-10-CM | POA: Insufficient documentation

## 2024-07-11 DIAGNOSIS — R079 Chest pain, unspecified: Secondary | ICD-10-CM

## 2024-07-11 DIAGNOSIS — R110 Nausea: Secondary | ICD-10-CM | POA: Insufficient documentation

## 2024-07-11 DIAGNOSIS — R1011 Right upper quadrant pain: Secondary | ICD-10-CM | POA: Insufficient documentation

## 2024-07-11 DIAGNOSIS — R1013 Epigastric pain: Secondary | ICD-10-CM | POA: Diagnosis not present

## 2024-07-11 DIAGNOSIS — R0602 Shortness of breath: Secondary | ICD-10-CM | POA: Diagnosis not present

## 2024-07-11 LAB — URINALYSIS, ROUTINE W REFLEX MICROSCOPIC
Bacteria, UA: NONE SEEN
Bilirubin Urine: NEGATIVE
Glucose, UA: NEGATIVE mg/dL
Hgb urine dipstick: NEGATIVE
Ketones, ur: NEGATIVE mg/dL
Leukocytes,Ua: NEGATIVE
Nitrite: NEGATIVE
Protein, ur: NEGATIVE mg/dL
Specific Gravity, Urine: 1.025 (ref 1.005–1.030)
pH: 6 (ref 5.0–8.0)

## 2024-07-11 LAB — CBC WITH DIFFERENTIAL/PLATELET
Abs Immature Granulocytes: 0.02 K/uL (ref 0.00–0.07)
Basophils Absolute: 0.1 K/uL (ref 0.0–0.1)
Basophils Relative: 1 %
Eosinophils Absolute: 0.6 K/uL — ABNORMAL HIGH (ref 0.0–0.5)
Eosinophils Relative: 6 %
HCT: 39.7 % (ref 36.0–46.0)
Hemoglobin: 13.8 g/dL (ref 12.0–15.0)
Immature Granulocytes: 0 %
Lymphocytes Relative: 35 %
Lymphs Abs: 3.6 K/uL (ref 0.7–4.0)
MCH: 27.3 pg (ref 26.0–34.0)
MCHC: 34.8 g/dL (ref 30.0–36.0)
MCV: 78.6 fL — ABNORMAL LOW (ref 80.0–100.0)
Monocytes Absolute: 0.7 K/uL (ref 0.1–1.0)
Monocytes Relative: 7 %
Neutro Abs: 5.2 K/uL (ref 1.7–7.7)
Neutrophils Relative %: 51 %
Platelets: 322 K/uL (ref 150–400)
RBC: 5.05 MIL/uL (ref 3.87–5.11)
RDW: 14.5 % (ref 11.5–15.5)
WBC: 10.3 K/uL (ref 4.0–10.5)
nRBC: 0 % (ref 0.0–0.2)

## 2024-07-11 LAB — COMPREHENSIVE METABOLIC PANEL WITH GFR
ALT: 11 U/L (ref 0–44)
AST: 17 U/L (ref 15–41)
Albumin: 4.3 g/dL (ref 3.5–5.0)
Alkaline Phosphatase: 86 U/L (ref 38–126)
Anion gap: 14 (ref 5–15)
BUN: 11 mg/dL (ref 6–20)
CO2: 19 mmol/L — ABNORMAL LOW (ref 22–32)
Calcium: 10.3 mg/dL (ref 8.9–10.3)
Chloride: 104 mmol/L (ref 98–111)
Creatinine, Ser: 1.05 mg/dL — ABNORMAL HIGH (ref 0.44–1.00)
GFR, Estimated: >60 mL/min (ref >60)
Glucose, Bld: 66 mg/dL — ABNORMAL LOW (ref 70–99)
Potassium: 4.1 mmol/L (ref 3.5–5.1)
Sodium: 137 mmol/L (ref 135–145)
Total Bilirubin: 0.4 mg/dL (ref 0.0–1.2)
Total Protein: 7.4 g/dL (ref 6.5–8.1)

## 2024-07-11 LAB — TROPONIN T, HIGH SENSITIVITY
Troponin T High Sensitivity: <15 ng/L (ref <19)
Troponin T High Sensitivity: <15 ng/L (ref <19)

## 2024-07-11 LAB — HCG, SERUM, QUALITATIVE: Preg, Serum: NEGATIVE

## 2024-07-11 LAB — PRO BRAIN NATRIURETIC PEPTIDE: Pro Brain Natriuretic Peptide: <36 pg/mL (ref <300.0)

## 2024-07-11 MED ORDER — IOHEXOL 350 MG/ML SOLN
100.0000 mL | Freq: Once | INTRAVENOUS | Status: AC | PRN
  Administered 2024-07-11: 100 mL via INTRAVENOUS

## 2024-07-11 MED ORDER — FAMOTIDINE 20 MG PO TABS: 20.0000 mg | ORAL_TABLET | Freq: Two times a day (BID) | ORAL | 0 refills | Status: AC

## 2024-07-11 MED ORDER — ACETAMINOPHEN 325 MG PO TABS
650.0000 mg | ORAL_TABLET | Freq: Once | ORAL | Status: AC
  Administered 2024-07-11: 650 mg via ORAL
  Filled 2024-07-11: qty 2

## 2024-07-11 NOTE — ED Notes (Signed)
 Pt seen in room for shortness of breath. Pt easily winded when talking. SpO2 on room air 100%, clear bilateral breath sounds. No respiratory hx per pt. Provider at bedside. RT will continue to be available as needed.

## 2024-07-11 NOTE — ED Notes (Signed)
 Pt gave birth in December; SOB today. MD [facility] notified.

## 2024-07-11 NOTE — Discharge Instructions (Signed)
  For your abdominal and chest pain, I do wonder whether or not this could be more GERD related.  Therefore, I discharged her famotidine  free to take this twice per day.  Take this as prescribed.  See this helps with your pain.  He can follow back up with your primary care physician.   Your cardiac workup today was unremarkable.  Your CT scan of your chest abdomen was unremarkable.  As we discussed, you there was some free fluid in your lower abdominal area in your pelvic floor which could be natural for women your age.  You are not having any kind of pain in your pelvic area.  If you start noticing worsening pain in this area then please come back to the ED.

## 2024-07-11 NOTE — ED Provider Notes (Signed)
 [place] EMERGENCY DEPARTMENT AT [facility] Provider Note   CSN: [id number] Arrival date & time: 07/11/24  1716     Patient presents with: Shortness of Breath   Andrea Archer is a 23 y.o. female.    Shortness of Breath   Patient presents because of chest pain as well as shortness of breath.  Patient states that the couple days ago subsequently started feeling short of breath in the setting of possible allergy exposure.  Since then she states it has been like she is having a hard time catching her breath.  Endorses some midsternal chest pain when she takes a deep breath.  She also states that it hurts whenever she presses on her chest right there.  No rash.  No history of DVT or PE.  Does endorse pleuritic chest pain.  No hemoptysis.  Denies any pain or swelling in her lower extremities.  No obvious exertional component of the chest pain.  Only exacerbating factor is pressing on the midsternal area.  Also endorsing nausea as well as abdominal pain in the right upper quadrant.  No vomiting.  Bowel moods been normal.  Maybe some slight dysuria.  No vaginal discharge.  Patient denies any history of asthma.  Denies any history of smoking.   Previous medical history reviewed : Patient last followed up with orthopedic surgery in May 2025.  Joint pain.  Recommended follow-up with MRI as well as rheumatology.     Prior to Admission medications   Medication Sig Start Date End Date Taking? Authorizing Provider  famotidine  (PEPCID ) 20 MG tablet Take 1 tablet (20 mg total) by mouth 2 (two) times daily. 07/11/24  Yes Simon Lavonia SAILOR, MD  butalbital -acetaminophen -caffeine  (FIORICET) 50-325-40 MG tablet Take 1 tablet by mouth every 12 (twelve) hours as needed for headache. Patient not taking: Reported on 05/06/2024 05/06/24   Gregg Lek, MD  metroNIDAZOLE  (FLAGYL ) 500 MG tablet Take 1 tablet (500 mg total) by mouth 2 (two) times daily. Patient not taking: Reported on 06/03/2024 05/22/24   Rudy Carlin LABOR, MD  metroNIDAZOLE  (METROGEL ) 0.75 % vaginal gel Place 1 Applicatorful vaginally at bedtime. Apply one applicatorful to vagina at bedtime for 5 days Patient not taking: Reported on 06/03/2024 05/22/24   Rudy Carlin LABOR, MD  Prenatal Vit-Fe Phos-FA-Omega (VITAFOL  GUMMIES) 3.33-0.333-34.8 MG CHEW Chew 3 tablets by mouth daily before breakfast. 05/20/24   Rudy Carlin LABOR, MD    Allergies: Other, Mushroom, and Pineapple    Review of Systems  Respiratory:  Positive for shortness of breath.     Updated Vital Signs BP 122/80   Pulse 65   Temp 98 F (36.7 C)   Resp 16   Ht 5' 7 (1.702 m)   Wt 82.9 kg   LMP 04/27/2024 (Exact Date)   SpO2 100%   Breastfeeding Yes   BMI 28.62 kg/m   Physical Exam Vitals and nursing note reviewed.  Constitutional:      General: She is not in acute distress.    Appearance: She is well-developed.  HENT:     Head: Normocephalic and atraumatic.  Eyes:     Conjunctiva/sclera: Conjunctivae normal.  Cardiovascular:     Rate and Rhythm: Normal rate and regular rhythm.     Heart sounds: No murmur heard. Pulmonary:     Effort: Pulmonary effort is normal. No respiratory distress.     Breath sounds: Normal breath sounds.  Abdominal:     Palpations: Abdomen is soft.     Tenderness: There  is no abdominal tenderness.  Musculoskeletal:        General: No swelling.       Arms:     Cervical back: Neck supple.  Skin:    General: Skin is warm and dry.     Capillary Refill: Capillary refill takes less than 2 seconds.  Neurological:     Mental Status: She is alert.  Psychiatric:        Mood and Affect: Mood normal.     (all labs ordered are listed, but only abnormal results are displayed) Labs Reviewed  COMPREHENSIVE METABOLIC PANEL WITH GFR - Abnormal; Notable for the following components:      Result Value   CO2 19 (*)    Glucose, Bld 66 (*)    Creatinine, Ser 1.05 (*)    All other components within normal limits  CBC WITH  DIFFERENTIAL/PLATELET - Abnormal; Notable for the following components:   MCV 78.6 (*)    Eosinophils Absolute 0.6 (*)    All other components within normal limits  HCG, SERUM, QUALITATIVE  URINALYSIS, ROUTINE W REFLEX MICROSCOPIC  PRO BRAIN NATRIURETIC PEPTIDE  TROPONIN T, HIGH SENSITIVITY  TROPONIN T, HIGH SENSITIVITY    EKG: EKG Interpretation Date/Time:  Saturday July 11 2024 17:27:33 EDT Ventricular Rate:  83 PR Interval:  137 QRS Duration:  101 QT Interval:  382 QTC Calculation: 449 R Axis:   66  Text Interpretation: Sinus rhythm RSR' in V1 or V2, right VCD or RVH Confirmed by Simon Rea [phone]) on 07/11/2024 5:34:27 PM  Radiology: CT Angio Chest PE W and/or Wo Contrast Result Date: 07/11/2024 CLINICAL DATA:  Chest pain and shortness of breath for 3-4 days. Abdominal pain. EXAM: CT ANGIOGRAPHY CHEST CT ABDOMEN AND PELVIS WITH CONTRAST TECHNIQUE: Multidetector CT imaging of the chest was performed using the standard protocol during bolus administration of intravenous contrast. Multiplanar CT image reconstructions and MIPs were obtained to evaluate the vascular anatomy. Multidetector CT imaging of the abdomen and pelvis was performed using the standard protocol during bolus administration of intravenous contrast. RADIATION DOSE REDUCTION: This exam was performed according to the departmental dose-optimization program which includes automated exposure control, adjustment of the mA and/or kV according to patient size and/or use of iterative reconstruction technique. CONTRAST:  OMNIPAQUE  IOHEXOL  350 MG/ML SOLN COMPARISON:  Prior chest CT 12/28/2022 and prior abdominal CT scan 10/16/2023 FINDINGS: CTA CHEST FINDINGS Cardiovascular: The heart is normal in size. No pericardial effusion. The aorta is normal in caliber. No dissection or atherosclerotic calcification. No coronary artery calcifications. The pulmonary arterial tree is well opacified. No filling defects to suggest pulmonary  embolism. Mediastinum/Nodes: No mediastinal or hilar mass or lymphadenopathy. The esophagus is grossly normal. The thyroid gland is unremarkable. Lungs/Pleura: No acute pulmonary process. No infiltrates, edema or effusions. No worrisome pulmonary lesions or pulmonary nodules. 5 mm nodule in the left lower lobe adjacent to the major fissure is consistent with a small lymph node. Musculoskeletal: No significant bony findings. Review of the MIP images confirms the above findings. CT ABDOMEN and PELVIS FINDINGS Hepatobiliary: No focal hepatic lesions or intrahepatic biliary dilatation. The gallbladder is contracted. No common bile duct dilatation. Pancreas: Unremarkable. No pancreatic ductal dilatation or surrounding inflammatory changes. Spleen: Normal in size without focal abnormality. Adrenals/Urinary Tract: The adrenal glands and kidneys are normal. The bladder is unremarkable. Stomach/Bowel: The stomach, duodenum, small bowel and colon are grossly normal without oral contrast. No inflammatory changes, mass lesions or obstructive findings. The appendix is normal. Vascular/Lymphatic:  The aorta is normal in caliber. No dissection. The branch vessels are patent. The major venous structures are patent. No mesenteric or retroperitoneal mass or adenopathy. Small scattered lymph nodes are noted. Reproductive: The uterus and ovaries are unremarkable. Other: Small amount of free pelvic fluid could be physiologic or related to a ruptured cyst. Musculoskeletal: No significant bony findings. Incidental central and left paracentral disc protrusion noted at L4-5. Review of the MIP images confirms the above findings. IMPRESSION: 1. No CT findings for pulmonary embolism. 2. Normal thoracic and abdominal aorta. 3. No acute pulmonary findings. 4. No acute abdominal/pelvic findings, mass lesions or adenopathy. 5. Small amount of free pelvic fluid could be physiologic or related to a ruptured cyst. 6. Incidental central and left  paracentral disc protrusion at L4-5. Electronically Signed   By: MYRTIS Stammer M.D.   On: 07/11/2024 19:07   CT ABDOMEN PELVIS W CONTRAST Result Date: 07/11/2024 CLINICAL DATA:  Chest pain and shortness of breath for 3-4 days. Abdominal pain. EXAM: CT ANGIOGRAPHY CHEST CT ABDOMEN AND PELVIS WITH CONTRAST TECHNIQUE: Multidetector CT imaging of the chest was performed using the standard protocol during bolus administration of intravenous contrast. Multiplanar CT image reconstructions and MIPs were obtained to evaluate the vascular anatomy. Multidetector CT imaging of the abdomen and pelvis was performed using the standard protocol during bolus administration of intravenous contrast. RADIATION DOSE REDUCTION: This exam was performed according to the departmental dose-optimization program which includes automated exposure control, adjustment of the mA and/or kV according to patient size and/or use of iterative reconstruction technique. CONTRAST:  OMNIPAQUE  IOHEXOL  350 MG/ML SOLN COMPARISON:  Prior chest CT 12/28/2022 and prior abdominal CT scan 10/16/2023 FINDINGS: CTA CHEST FINDINGS Cardiovascular: The heart is normal in size. No pericardial effusion. The aorta is normal in caliber. No dissection or atherosclerotic calcification. No coronary artery calcifications. The pulmonary arterial tree is well opacified. No filling defects to suggest pulmonary embolism. Mediastinum/Nodes: No mediastinal or hilar mass or lymphadenopathy. The esophagus is grossly normal. The thyroid gland is unremarkable. Lungs/Pleura: No acute pulmonary process. No infiltrates, edema or effusions. No worrisome pulmonary lesions or pulmonary nodules. 5 mm nodule in the left lower lobe adjacent to the major fissure is consistent with a small lymph node. Musculoskeletal: No significant bony findings. Review of the MIP images confirms the above findings. CT ABDOMEN and PELVIS FINDINGS Hepatobiliary: No focal hepatic lesions or intrahepatic  biliary dilatation. The gallbladder is contracted. No common bile duct dilatation. Pancreas: Unremarkable. No pancreatic ductal dilatation or surrounding inflammatory changes. Spleen: Normal in size without focal abnormality. Adrenals/Urinary Tract: The adrenal glands and kidneys are normal. The bladder is unremarkable. Stomach/Bowel: The stomach, duodenum, small bowel and colon are grossly normal without oral contrast. No inflammatory changes, mass lesions or obstructive findings. The appendix is normal. Vascular/Lymphatic: The aorta is normal in caliber. No dissection. The branch vessels are patent. The major venous structures are patent. No mesenteric or retroperitoneal mass or adenopathy. Small scattered lymph nodes are noted. Reproductive: The uterus and ovaries are unremarkable. Other: Small amount of free pelvic fluid could be physiologic or related to a ruptured cyst. Musculoskeletal: No significant bony findings. Incidental central and left paracentral disc protrusion noted at L4-5. Review of the MIP images confirms the above findings. IMPRESSION: 1. No CT findings for pulmonary embolism. 2. Normal thoracic and abdominal aorta. 3. No acute pulmonary findings. 4. No acute abdominal/pelvic findings, mass lesions or adenopathy. 5. Small amount of free pelvic fluid could be physiologic or related  to a ruptured cyst. 6. Incidental central and left paracentral disc protrusion at L4-5. Electronically Signed   By: MYRTIS Stammer M.D.   On: 07/11/2024 19:07     Procedures   Medications Ordered in the ED  iohexol  (OMNIPAQUE ) 350 MG/ML injection 100 mL (100 mLs Intravenous Contrast Given 07/11/24 1846)  acetaminophen  (TYLENOL ) tablet 650 mg (650 mg Oral Given 07/11/24 1950)                                    Medical Decision Making Amount and/or Complexity of Data Reviewed Labs: ordered. Radiology: ordered.  Risk OTC drugs. Prescription drug management.     Patient presents because of chest pain  as well as shortness of breath.  Patient states that the couple days ago subsequently started feeling short of breath in the setting of possible allergy exposure.  Since then she states it has been like she is having a hard time catching her breath.  Endorses some midsternal chest pain when she takes a deep breath.  She also states that it hurts whenever she presses on her chest right there.  No rash.  No history of DVT or PE.  Does endorse pleuritic chest pain.  No hemoptysis.  Denies any pain or swelling in her lower extremities.  No obvious exertional component of the chest pain.  Only exacerbating factor is pressing on the midsternal area.  Also endorsing nausea as well as abdominal pain in the right upper quadrant.  No vomiting.  Bowel moods been normal.  Maybe some slight dysuria.  No vaginal discharge.  Patient denies any history of asthma.  Denies any history of smoking. Previous medical history reviewed : Patient last followed up with orthopedic surgery in May 2025.  Joint pain.  Recommended follow-up with MRI as well as rheumatology.   Upon exam, patient imaging was stable.  No tachycardia or tachypnea.  O2 saturation are percent on room air.  No acute distress.   Patient does have reproducible chest wall pain in the midsternal area.   In terms of cardiac workup.  Sinus rhythm on the EKG as well as cardiac telemetry.  No arrhythmia.  Troponin x 2 unremarkable.  No risk factors for any kind of ACS pathology.  No concern for myocarditis or endocarditis at this point in time.  Reassuring cardiac workup.  Pleuritic aspect of the chest pain as well as some tachypnea.  Did obtain CTA chest to rule out an [facility] PE.  CTA chest did not show acute of acute intrathoracic pathology.  Also expanded to CT abdomen pelvis given right upper quadrant pain to palpation.  Showed no epigastric pathology.  No evidence of cholecystitis.  Did show some free fluid in the pelvic area.  Denies any kind of lower  abdominal pain.  No vaginal discharge.  No pain to palpation the lower abdominal area.  Do not think this is any evidence of any kind of ovarian torsion or other emergent pathology at this point time.   UA clear.  Did endorse some dysuria but no evidence of UTI.  No evidence of pyelonephritis.   I did start patient on famotidine  twice daily.  Wonder if this could be more related to GERD.  I think this could be possible given the epigastric pain especially some radiation of anterior chest.  Recommended strict diet control as well as trial of famotidine .       Final  diagnoses:  Chest pain, unspecified type  Epigastric pain    ED Discharge Orders          Ordered    famotidine  (PEPCID ) 20 MG tablet  2 times daily        07/11/24 2026               Simon Lavonia SAILOR, MD 07/11/24 2043

## 2024-07-11 NOTE — ED Triage Notes (Signed)
 Pt via pov from home with sob x 3-4 days. Pt states she was seen by pcp yesterday and had abnormal EKG; was put on steroids because provider thought she might have inflammation in her chest/upper abdominal area. Pt alert & oriented, nad noted.

## 2024-07-16 ENCOUNTER — Ambulatory Visit: Attending: Cardiology | Admitting: Cardiology

## 2024-07-16 ENCOUNTER — Encounter: Payer: Self-pay | Admitting: Cardiology

## 2024-07-16 ENCOUNTER — Ambulatory Visit: Admitting: Cardiology

## 2024-07-16 VITALS — BP 114/74 | HR 93 | Ht 67.0 in | Wt 181.6 lb

## 2024-07-16 DIAGNOSIS — Z8759 Personal history of other complications of pregnancy, childbirth and the puerperium: Secondary | ICD-10-CM

## 2024-07-16 DIAGNOSIS — R079 Chest pain, unspecified: Secondary | ICD-10-CM | POA: Diagnosis present

## 2024-07-16 NOTE — Progress Notes (Unsigned)
 Cardiology Office Note:   Date:  07/17/2024  ID:  Andrea Archer, DOB 06-18-2001, MRN [id number] PCP: Pcp, No  [facility] Providers Cardiologist:  None    History of Present Illness:    Discussed the use of AI scribe software for clinical note transcription with the patient, who gave verbal consent to proceed.  History of Present Illness Andrea Archer is a 23 year old female who presents with chest pain. She is accompanied by her baby. She was referred by her primary care provider for evaluation of chest pain.  She has been experiencing chest pain for the past week, primarily occurring intermittently without discernable provocation. She is active at baseline, hasn't noticed worsened symptoms with activity. Patient points to epigastric region for location of pain and reports radiation to mid-sternal area, sharp, and stabbing, radiating up and down her chest, and sometimes affecting her arms and legs. It significantly worsens with deep breaths and when pressing on her sternum, which is sore to touch.  She recently visited the emergency department with this chest pain and shortness of breath. An extensive workup, including a CTA chest and CTA abdomen pelvis, showed no acute findings. High sensitivity troponin and ProBNP tests were negative. Nonspecific T wave changes noted on ECG. She was ultimately discharged with no emergent findings.  She was started on prednisone  by her primary care provider on 7/11 when she was initially seen for this chest pain, which per patient provides some relief for a few hours after taking it, but the pain returns later. No significant change in symptoms since starting prednisone . She denies any history of acid reflux and reports a sensation of being 'squeezed'. The pain is random and not related to meals. Denies associated symptoms such as palpitations.  Per patient history, family history is significant for heart issues, blood clots, sickle cell, heart  attacks, and high blood pressure. Her father had heart issues and a heart attack, and her grandfather had heart failure. Her grandmother died due to an allergic reaction to ibuprofen .  She has a history of hypertension towards the end of her pregnancy in 2020, says she was not treated with medication. She attributes gestational hypertension to high stress during that time, leading to a high-risk pregnancy.  She experienced an allergic reaction last week, causing difficulty breathing and itchy nose. She has a history of anemia, anxiety, hypertension with pregnancy, and sickle cell trait.  Patient is currently breastfeeding.    Studies Reviewed:    EKG:   EKG Interpretation Date/Time:  Thursday July 16 2024 15:37:34 EDT Ventricular Rate:  93 PR Interval:  126 QRS Duration:  86 QT Interval:  358 QTC Calculation: 445 R Axis:   53  Text Interpretation: Normal sinus rhythm ST & T wave abnormality, consider inferior ischemia When compared with ECG of 11-Jul-2024 17:27, PREVIOUS ECG IS PRESENT Interpretation limited secondary to artifact Confirmed by Trudy Birmingham [phone]) on 07/16/2024 3:39:48 PM    Risk Assessment/Calculations:              Physical Exam:   VS:  BP 114/74   Pulse 93   Ht 5' 7 (1.702 m)   Wt 181 lb 9.6 oz (82.4 kg)   LMP 07/16/2024 (Exact Date)   SpO2 99%   Breastfeeding Unknown   BMI 28.44 kg/m    Wt Readings from Last 3 Encounters:  07/16/24 181 lb 9.6 oz (82.4 kg)  07/11/24 182 lb 12.2 oz (82.9 kg)  05/20/24 182 lb 11.2 oz (82.9  kg)     Physical Exam Vitals reviewed.  Constitutional:      Appearance: Normal appearance.  HENT:     Head: Normocephalic.     Nose: Nose normal.  Eyes:     Pupils: Pupils are equal, round, and reactive to light.  Cardiovascular:     Rate and Rhythm: Normal rate and regular rhythm.     Pulses: Normal pulses.     Heart sounds: Normal heart sounds. No murmur heard.    No friction rub. No gallop.  Pulmonary:     Effort:  Pulmonary effort is normal.     Breath sounds: Normal breath sounds.  Abdominal:     General: Abdomen is flat.  Musculoskeletal:     Right lower leg: No edema.     Left lower leg: No edema.     Comments: Chest wall very tender to palpation  Skin:    General: Skin is warm and dry.     Capillary Refill: Capillary refill takes less than 2 seconds.  Neurological:     General: No focal deficit present.     Mental Status: She is alert and oriented to person, place, and time.  Psychiatric:        Mood and Affect: Mood normal.        Behavior: Behavior normal.        Thought Content: Thought content normal.        Judgment: Judgment normal.     ASSESSMENT AND PLAN:    Assessment & Plan Chest pain Intermittent mid-sternal chest pain exacerbated by deep breaths and palpation, with associated shortness of breath. Extensive cardiac workup including EKG, CTA chest, and cardiac enzymes were negative for acute cardiac issues. EKG changes are nonspecific and consistent with previous EKGs from 2018. - Order echocardiogram to evaluate cardiac function - Recommend trial of over-the-counter omeprazole  for potential acid reflux, pending confirmation from OB/GYN due to breastfeeding - Advise return to emergency department if symptoms worsen or new symptoms develop  Shortness of breath Shortness of breath associated with chest pain. No clear cardiac cause with negative cardiac enzymes and imaging. Consideration of non-cardiac causes such as anxiety or acid reflux.  Anxiety Increased anxiety levels, possibly related to postpartum period, potentially contributing to current symptoms. - Follow up with PCP  Hypertension with pregnancy Hypertension during pregnancy in 2020. Normotensive today.           Signed, Artist Pouch, PA-C

## 2024-07-16 NOTE — Patient Instructions (Signed)
 Medication Instructions:  No changes *If you need a refill on your cardiac medications before your next appointment, please call your pharmacy*  Lab Work: No labs  Testing/Procedures: Your physician has requested that you have an echocardiogram. Echocardiography is a painless test that uses sound waves to create images of your heart. It provides your doctor with information about the size and shape of your heart and how well your heart's chambers and valves are working. This procedure takes approximately one hour. There are no restrictions for this procedure. Please do NOT wear cologne, perfume, aftershave, or lotions (deodorant is allowed). Please arrive 15 minutes prior to your appointment time.  Please note: We ask at that you not bring children with you during ultrasound (echo/ vascular) testing. Due to room size and safety concerns, children are not allowed in the ultrasound rooms during exams. Our front office staff cannot provide observation of children in our lobby area while testing is being conducted. An adult accompanying a patient to their appointment will only be allowed in the ultrasound room at the discretion of the ultrasound technician under special circumstances. We apologize for any inconvenience.  Follow-Up: At [facility], you and your health needs are our priority.  As part of our continuing mission to provide you with exceptional heart care, our providers are all part of one team.  This team includes your primary Cardiologist (physician) and Advanced Practice Providers or APPs (Physician Assistants and Nurse Practitioners) who all work together to provide you with the care you need, when you need it.  Your next appointment:   3 month(s)  Provider:   [facility]  We recommend signing up for the patient portal called MyChart.  Sign up information is provided on this After Visit Summary.  MyChart is used to connect with patients for Virtual Visits (Telemedicine).   Patients are able to view lab/test results, encounter notes, upcoming appointments, etc.  Non-urgent messages can be sent to your provider as well.   To learn more about what you can do with MyChart, go to [email or web address].

## 2024-07-17 DIAGNOSIS — R079 Chest pain, unspecified: Secondary | ICD-10-CM | POA: Insufficient documentation

## 2024-07-23 ENCOUNTER — Ambulatory Visit: Admitting: Obstetrics

## 2024-07-23 ENCOUNTER — Encounter: Payer: Self-pay | Admitting: Obstetrics

## 2024-07-23 VITALS — BP 126/89 | HR 97 | Ht 67.0 in | Wt 178.9 lb

## 2024-07-23 DIAGNOSIS — R102 Pelvic and perineal pain: Secondary | ICD-10-CM | POA: Diagnosis not present

## 2024-07-23 DIAGNOSIS — N941 Unspecified dyspareunia: Secondary | ICD-10-CM

## 2024-07-23 DIAGNOSIS — R300 Dysuria: Secondary | ICD-10-CM

## 2024-07-23 DIAGNOSIS — B9689 Other specified bacterial agents as the cause of diseases classified elsewhere: Secondary | ICD-10-CM

## 2024-07-23 DIAGNOSIS — N760 Acute vaginitis: Secondary | ICD-10-CM | POA: Diagnosis not present

## 2024-07-23 LAB — POCT URINALYSIS DIPSTICK
Bilirubin, UA: NEGATIVE
Blood, UA: NEGATIVE
Glucose, UA: NEGATIVE
Ketones, UA: NEGATIVE
Nitrite, UA: NEGATIVE
Protein, UA: NEGATIVE
Spec Grav, UA: 1.01 (ref 1.010–1.025)
Urobilinogen, UA: 2 U/dL — AB
pH, UA: 6 (ref 5.0–8.0)

## 2024-07-23 MED ORDER — METRONIDAZOLE 0.75 % VA GEL: 1.0000 | Freq: Two times a day (BID) | VAGINAL | 1 refills | Status: AC

## 2024-07-23 MED ORDER — IBUPROFEN 800 MG PO TABS: 800.0000 mg | ORAL_TABLET | Freq: Three times a day (TID) | ORAL | 5 refills | Status: AC | PRN

## 2024-07-23 MED ORDER — CEFUROXIME AXETIL 500 MG PO TABS: 500.0000 mg | ORAL_TABLET | Freq: Two times a day (BID) | ORAL | 0 refills | Status: AC

## 2024-07-23 NOTE — Progress Notes (Signed)
 Pt presents for ER follow up. Painful intercourse,frequent urination , no dicharge or itching.

## 2024-07-23 NOTE — Progress Notes (Signed)
 Patient ID: Andrea Archer, female   DOB: May 07, 2001, 23 y.o.   MRN: [id number]  No chief complaint on file.   HPI Andrea Archer is a 23 y.o. female.  Complains of suprapubic pain with intercourse, and also urinary frequency and suprapubic pain with urination, HPI  Past Medical History:  Diagnosis Date   Anemia    Anxiety    Bacterial vaginitis    Depression    Hypertension    with pregnancy   Pregnant    Sickle cell trait (HCC)    UTI (urinary tract infection)     Past Surgical History:  Procedure Laterality Date   FINGER SURGERY Left    Pinky Finger   FRACTURE SURGERY  01/09/2019   left pinky finger    Family History  Problem Relation Age of Onset   Cancer Mother        cervical ca 2023   Hypertension Mother    Hypertension Father    Heart disease Father    Sickle cell anemia Father    Sickle cell trait Father        has sickle cell   Diabetes Maternal Grandmother    Hypertension Maternal Grandmother    Heart disease Maternal Grandmother    Fibromyalgia Maternal Grandmother    Hypertension Maternal Grandfather    Pancreatic cancer Maternal Grandfather    Hypertension Paternal Grandmother    Hypertension Paternal Grandfather    Colon cancer Neg Hx    Stomach cancer Neg Hx    Esophageal cancer Neg Hx    Rectal cancer Neg Hx     Social History Social History   Tobacco Use   Smoking status: Former    Types: Cigars    Passive exposure: Past   Smokeless tobacco: Never   Tobacco comments:    Quit 2022  Vaping Use   Vaping status: Never Used  Substance Use Topics   Alcohol use: Not Currently    Comment: occ, not since confirmed pregnancy   Drug use: No    Allergies  Allergen Reactions   Other     Henna - rash   Mushroom Rash   Pineapple Rash    Current Outpatient Medications  Medication Sig Dispense Refill   cefUROXime  (CEFTIN ) 500 MG tablet Take 1 tablet (500 mg total) by mouth 2 (two) times daily with a meal. 14 tablet 0   ibuprofen  (ADVIL )  800 MG tablet Take 1 tablet (800 mg total) by mouth every 8 (eight) hours as needed. 30 tablet 5   Prenatal Vit-Fe Phos-FA-Omega (VITAFOL  GUMMIES) 3.33-0.333-34.8 MG CHEW Chew 3 tablets by mouth daily before breakfast. 90 tablet 11   butalbital -acetaminophen -caffeine  (FIORICET) 50-325-40 MG tablet Take 1 tablet by mouth every 12 (twelve) hours as needed for headache. (Patient not taking: Reported on 07/23/2024) 10 tablet 0   famotidine  (PEPCID ) 20 MG tablet Take 1 tablet (20 mg total) by mouth 2 (two) times daily. (Patient not taking: Reported on 07/23/2024) 30 tablet 0   metroNIDAZOLE  (FLAGYL ) 500 MG tablet Take 1 tablet (500 mg total) by mouth 2 (two) times daily. (Patient not taking: Reported on 07/23/2024) 14 tablet 2   metroNIDAZOLE  (METROGEL ) 0.75 % vaginal gel Place 1 Applicatorful vaginally 2 (two) times daily. 70 g 1   predniSONE  (DELTASONE ) 20 MG tablet Take 40 mg by mouth daily. (Patient not taking: Reported on 07/23/2024)     No current facility-administered medications for this visit.    Review of Systems Review of Systems Constitutional: negative for fatigue and  weight loss Respiratory: negative for cough and wheezing Cardiovascular: negative for chest pain, fatigue and palpitations Gastrointestinal: negative for abdominal pain and change in bowel habits Genitourinary:positive for pelvic pain with intercourse, and urinary frequency and suprapubic pain with urination Integument/breast: negative for nipple discharge Musculoskeletal:negative for myalgias Neurological: negative for gait problems and tremors Behavioral/Psych: negative for abusive relationship, depression Endocrine: negative for temperature intolerance      Blood pressure 126/89, pulse 97, height 5' 7 (1.702 m), weight 178 lb 14.4 oz (81.1 kg), last menstrual period 07/16/2024, unknown if currently breastfeeding.  Physical Exam Physical Exam General:   Alert and no distress  Skin:   no rash or abnormalities  Lungs:    clear to auscultation bilaterally  Heart:   regular rate and rhythm, S1, S2 normal, no murmur, click, rub or gallop  Breasts:   normal without suspicious masses, skin or nipple changes or axillary nodes  Abdomen:  normal findings: no organomegaly, soft, non-tender and no hernia  Pelvis:  External genitalia: normal general appearance Urinary system: urethral meatus normal.  Positive suprapubic tenderness  Vaginal: normal without tenderness, induration or masses Cervix: normal appearance Adnexa: bilateral adnexal tenderness, no masses Uterus: anteverted and non-tender, normal size    I have spent a total of 20 minutes of face-to-face time, excluding clinical staff time, reviewing notes and preparing to see patient, ordering tests and/or medications, and counseling the patient.   Data Reviewed Wet Prep Urinalysis  Assessment     1. Dysuria (Primary) Rx: - POCT Urinalysis Dipstick - Urine Culture - cefUROXime  (CEFTIN ) 500 MG tablet; Take 1 tablet (500 mg total) by mouth 2 (two) times daily with a meal.  Dispense: 14 tablet; Refill: 0  2. Dyspareunia in female  3. Pelvic pain Rx: - US  PELVIC COMPLETE WITH TRANSVAGINAL; Future - ibuprofen  (ADVIL ) 800 MG tablet; Take 1 tablet (800 mg total) by mouth every 8 (eight) hours as needed.  Dispense: 30 tablet; Refill: 5  4. Bacterial vaginosis Rx: - metroNIDAZOLE  (METROGEL ) 0.75 % vaginal gel; Place 1 Applicatorful vaginally 2 (two) times daily.  Dispense: 70 g; Refill: 1     Plan   Follow up in 2 weeks  Orders Placed This Encounter  Procedures   Urine Culture   US  PELVIC COMPLETE WITH TRANSVAGINAL    Standing Status:   Future    Expiration Date:   07/23/2025    Reason for Exam (SYMPTOM  OR DIAGNOSIS REQUIRED):   Pelvic pain    Preferred imaging location?:   [facility]   POCT Urinalysis Dipstick   Meds ordered this encounter  Medications   cefUROXime  (CEFTIN ) 500 MG tablet    Sig: Take 1 tablet (500 mg total) by mouth 2  (two) times daily with a meal.    Dispense:  14 tablet    Refill:  0   metroNIDAZOLE  (METROGEL ) 0.75 % vaginal gel    Sig: Place 1 Applicatorful vaginally 2 (two) times daily.    Dispense:  70 g    Refill:  1   ibuprofen  (ADVIL ) 800 MG tablet    Sig: Take 1 tablet (800 mg total) by mouth every 8 (eight) hours as needed.    Dispense:  30 tablet    Refill:  5     CARLIN RONAL CENTERS MD, FACOG Attending Obstetrician & Gynecologist, [facility] for [facility], [facility], [place] 07/23/2024

## 2024-07-24 ENCOUNTER — Other Ambulatory Visit

## 2024-07-24 ENCOUNTER — Telehealth: Payer: Self-pay | Admitting: Cardiology

## 2024-07-24 NOTE — Telephone Encounter (Signed)
 Called and LVM for patient apologizing for her experience with us  on previous visit on 07/16/24 and let her know that I followed up on getting a voucher for her gas. Per our conversation about reimbursement for her gas she Wasted on her trip to the office and she was not able to be seen at this time. We are unable at this time to give any kind of reimbursement/voucher for this.

## 2024-07-25 LAB — URINE CULTURE

## 2024-07-31 ENCOUNTER — Other Ambulatory Visit

## 2024-07-31 ENCOUNTER — Ambulatory Visit (HOSPITAL_BASED_OUTPATIENT_CLINIC_OR_DEPARTMENT_OTHER)
Admission: RE | Admit: 2024-07-31 | Discharge: 2024-07-31 | Disposition: A | Discharge status: Home / Self Care | Source: Ambulatory Visit | Attending: Obstetrics | Admitting: Obstetrics

## 2024-07-31 DIAGNOSIS — R102 Pelvic and perineal pain: Secondary | ICD-10-CM | POA: Insufficient documentation

## 2024-08-03 ENCOUNTER — Other Ambulatory Visit (HOSPITAL_COMMUNITY)
Admission: RE | Admit: 2024-08-03 | Discharge: 2024-08-03 | Disposition: A | Discharge status: Home / Self Care | Source: Ambulatory Visit | Attending: Obstetrics and Gynecology | Admitting: Obstetrics and Gynecology

## 2024-08-03 ENCOUNTER — Ambulatory Visit

## 2024-08-03 VITALS — BP 117/76 | HR 80 | Wt 181.0 lb

## 2024-08-03 DIAGNOSIS — N898 Other specified noninflammatory disorders of vagina: Secondary | ICD-10-CM

## 2024-08-03 LAB — POCT URINALYSIS DIPSTICK
Bilirubin, UA: NEGATIVE
Blood, UA: NEGATIVE
Glucose, UA: NEGATIVE
Ketones, UA: NEGATIVE
Leukocytes, UA: NEGATIVE
Nitrite, UA: NEGATIVE
Protein, UA: NEGATIVE
Spec Grav, UA: 1.015 (ref 1.010–1.025)
Urobilinogen, UA: 0.2 U/dL
pH, UA: 5 (ref 5.0–8.0)

## 2024-08-03 NOTE — Progress Notes (Addendum)
 SUBJECTIVE:  23 y.o. female complains of white and curd-like vaginal discharge for 5 day(s). Denies abnormal vaginal bleeding or significant pelvic pain or fever. No UTI symptoms. Denies history of known exposure to STD.  Patient's last menstrual period was 07/16/2024.  OBJECTIVE:  She appears well, afebrile. TOC Urine dipstick: negative for all components. Pt completed treatment for UTI.  ASSESSMENT:  Vaginal Discharge  Negative TOC Urine Dipstick    PLAN:  GC, chlamydia, trichomonas, BVAG, CVAG probe sent to lab. Treatment: To be determined once lab results are received ROV prn if symptoms persist or worsen.

## 2024-08-04 LAB — CERVICOVAGINAL ANCILLARY ONLY
Bacterial Vaginitis (gardnerella): NEGATIVE
Candida Glabrata: NEGATIVE
Candida Vaginitis: NEGATIVE
Chlamydia: NEGATIVE
Comment: NEGATIVE
Comment: NEGATIVE
Comment: NEGATIVE
Comment: NEGATIVE
Comment: NEGATIVE
Comment: NORMAL
Neisseria Gonorrhea: NEGATIVE
Trichomonas: NEGATIVE

## 2024-08-21 ENCOUNTER — Ambulatory Visit
Admission: RE | Admit: 2024-08-21 | Discharge: 2024-08-21 | Disposition: A | Discharge status: Home / Self Care | Source: Ambulatory Visit | Attending: Physician Assistant | Admitting: Physician Assistant

## 2024-08-21 DIAGNOSIS — M25551 Pain in right hip: Secondary | ICD-10-CM

## 2024-08-21 DIAGNOSIS — M2550 Pain in unspecified joint: Secondary | ICD-10-CM

## 2024-08-21 DIAGNOSIS — S73191A Other sprain of right hip, initial encounter: Secondary | ICD-10-CM

## 2024-08-21 MED ORDER — IOPAMIDOL (ISOVUE-M 200) INJECTION 41%
10.0000 mL | Freq: Once | INTRAMUSCULAR | Status: AC
  Administered 2024-08-21: 10 mL via INTRA_ARTICULAR

## 2024-08-24 ENCOUNTER — Ambulatory Visit (HOSPITAL_COMMUNITY)
Admission: RE | Admit: 2024-08-24 | Discharge: 2024-08-24 | Disposition: A | Discharge status: Home / Self Care | Source: Ambulatory Visit | Attending: Cardiology | Admitting: Cardiology

## 2024-08-24 DIAGNOSIS — R079 Chest pain, unspecified: Secondary | ICD-10-CM | POA: Insufficient documentation

## 2024-08-24 LAB — ECHOCARDIOGRAM COMPLETE
Area-P 1/2: 3.4 cm2
S' Lateral: 2.8 cm

## 2024-08-25 ENCOUNTER — Ambulatory Visit: Payer: Self-pay | Admitting: Cardiology

## 2024-09-04 ENCOUNTER — Telehealth (HOSPITAL_BASED_OUTPATIENT_CLINIC_OR_DEPARTMENT_OTHER): Payer: Self-pay | Admitting: Orthopaedic Surgery

## 2024-09-04 NOTE — Telephone Encounter (Signed)
 Left two message for patient to call the office to sch an appointment @[phone] 

## 2024-09-07 ENCOUNTER — Ambulatory Visit (INDEPENDENT_AMBULATORY_CARE_PROVIDER_SITE_OTHER)

## 2024-09-07 DIAGNOSIS — Z3202 Encounter for pregnancy test, result negative: Secondary | ICD-10-CM

## 2024-09-07 DIAGNOSIS — R350 Frequency of micturition: Secondary | ICD-10-CM | POA: Diagnosis not present

## 2024-09-07 LAB — POCT URINALYSIS DIPSTICK
Bilirubin, UA: NEGATIVE
Glucose, UA: NEGATIVE
Ketones, UA: NEGATIVE
Leukocytes, UA: NEGATIVE
Nitrite, UA: NEGATIVE
Protein, UA: NEGATIVE
Spec Grav, UA: 1.02 (ref 1.010–1.025)
Urobilinogen, UA: 0.2 U/dL
pH, UA: 6 (ref 5.0–8.0)

## 2024-09-07 LAB — POCT URINE PREGNANCY: Preg Test, Ur: NEGATIVE

## 2024-09-07 MED ORDER — NITROFURANTOIN MONOHYD MACRO 100 MG PO CAPS: 100.0000 mg | ORAL_CAPSULE | Freq: Two times a day (BID) | ORAL | 0 refills | Status: AC

## 2024-09-07 NOTE — Progress Notes (Signed)
 Pt provided urine sample upon check-in and checked out prior to being seen. RN contacted patient over the phone for virtual visit.  SUBJECTIVE: Andrea Archer is a 23 y.o. female who complains of urinary frequency, urgency x 1 week, with flank pain and discharge; no fever, chills, or abnormal bleeding. She reports feeling as if her bladder is still full after emptying. C/O abdominal pressure, and back pain. Reports urine is clear and non odorous. Pt also requests a UPT today.  OBJECTIVE: Appears well, in no apparent distress.  Vital signs are normal. Urine dipstick shows positive for RBC's.    ASSESSMENT: Urinary frequency  PLAN: Urine sent for culture. Symptoms consistent with UTI, will send treatment. Call or return to clinic prn if these symptoms worsen or fail to improve as anticipated.

## 2024-09-09 ENCOUNTER — Ambulatory Visit: Payer: Self-pay | Admitting: Obstetrics and Gynecology

## 2024-09-09 LAB — URINE CULTURE

## 2024-10-13 ENCOUNTER — Ambulatory Visit: Admitting: Physician Assistant

## 2024-10-27 ENCOUNTER — Ambulatory Visit: Attending: Internal Medicine | Admitting: Internal Medicine

## 2024-10-27 ENCOUNTER — Encounter: Payer: Self-pay | Admitting: Internal Medicine

## 2024-10-27 VITALS — BP 124/87 | HR 85 | Temp 97.0°F | Resp 16 | Ht 66.5 in | Wt 186.0 lb

## 2024-10-27 DIAGNOSIS — M797 Fibromyalgia: Secondary | ICD-10-CM | POA: Diagnosis present

## 2024-10-27 DIAGNOSIS — M722 Plantar fascial fibromatosis: Secondary | ICD-10-CM | POA: Diagnosis present

## 2024-10-27 DIAGNOSIS — M2550 Pain in unspecified joint: Secondary | ICD-10-CM | POA: Diagnosis present

## 2024-10-27 MED ORDER — CYCLOBENZAPRINE HCL 5 MG PO TABS: 5.0000 mg | ORAL_TABLET | Freq: Every evening | ORAL | 0 refills | Status: AC | PRN
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
## 2024-10-27 NOTE — Patient Instructions (Signed)
 I recommend checking out the [employer] [facility]  patient-centered guide for fibromyalgia and chronic pain management: https://howell-gardner.net/    Plantar Fasciitis Exercises  Toe Curls With Towel  1. Place a small towel on the floor. Using involved foot, curl towel toward you, using only your toes. Relax. 2. Repeat 10 times, 1-2 times per day.    Ice Massage Arch Roll  1. With involved foot resting on a frozen can or water bottle, golf ball, or tennis ball, roll your foot back and forth over the object. 2. Repeat for 3-5 minutes, 2 times per day.    Toe Extension  1. Sit with involved leg crossed over uninvolved leg. Grasp toes with one hand and bend the toes and ankle upwards as far as possible to stretch the arch and calf muscle. With the other hand, perform deep massage along the arch of your foot. 2. Hold 10 seconds. Repeat for 2-3 minutes. Repeat 2-4 sessions per day.      Standing Calf Stretch  1. Stand placing hands on wall for support. Place your feet pointing straight ahead, with the involved foot in back of the other. The back leg should have a straight knee and front leg a bent knee. Shift forward, keeping back leg heel on the ground, so that you feel a stretch in the calf muscle of the back leg. 2. Hold 45 seconds, 2-3 times. Repeat 4-6 times per day.       Towel Stretch  The towel stretch is effective at reducing morning pain if done before getting out of bed.  1. Sit with involved leg straight out in front of you. Place a towel around your foot and gently pull toward you, feeling a stretch in your calf muscle. 2. Hold 45 seconds, 2-3 times. Repeat 4-6 times per day.      Calf Stretch on a Step  1. Stand with uninvolved foot flat on a step. Place involved ball of foot on the edge of the step. Gently let heel lower on involved leg to feel a stretch in your calf. 2. Hold 45 seconds, 2-3 times. Repeat 4-6 times per day.

## 2024-10-28 LAB — C3 AND C4
C3 Complement: 153 mg/dL (ref 83–193)
C4 Complement: 27 mg/dL (ref 15–57)

## 2024-10-28 LAB — C-REACTIVE PROTEIN: CRP: <3 mg/L (ref <8.0)

## 2024-10-28 LAB — SEDIMENTATION RATE: Sed Rate: 6 mm/h (ref 0–20)

## 2024-10-28 NOTE — Progress Notes (Unsigned)
 Cardiology Clinic Note   Patient Name: Andrea Archer Date of Encounter: 10/28/2024  Primary Care Provider:  Duwaine Annabella SAILOR, FNP Primary Cardiologist:  None  Patient Profile    Andrea Archer 23 year old female presents to the clinic today for follow-up evaluation of her chest discomfort.  Past Medical History    Past Medical History:  Diagnosis Date   Anemia    Anxiety    Bacterial vaginitis    Depression    Hypertension    with pregnancy   Pregnant    Sickle cell trait    UTI (urinary tract infection)    Past Surgical History:  Procedure Laterality Date   FINGER SURGERY Left    Pinky Finger   FRACTURE SURGERY  01/09/2019   left pinky finger    Allergies  Allergies  Allergen Reactions   Other Anaphylaxis    Henna - rash   Gabapentin  Hives and Nausea And Vomiting    Other Reaction(s): Dizziness, Intolerance - Will Not Trigger Allergy Alert   Mushroom Rash    Not allergic to them   Pineapple Rash    History of Present Illness    Andrea Archer has a PMH of chest discomfort and gestational hypertension.  Her PMH also includes anemia, anxiety, and sickle cell trait.  She was seen and evaluated by Artist Pouch, PA-C on 07/16/2024.  She described chest is comfort that was substernal and reproducible with palpation.  She describes the pain as sharp and stabbing.  It would radiate up and down her chest and sometimes affect her arms and legs.  She had been seen in the emergency department with chest discomfort and shortness of breath.  She underwent extensive workup which included CTA of her chest CTA of her abdomen pelvis.  No acute findings were noted.  High-sensitivity troponin and proBNP were negative.  Nonspecific T wave changes were noted on her EKG.  She had been started on prednisone  by her PCP on 07/10/2024.  She had been initially seen for chest pain.  She noted that prednisone  provided some relief for a few hours after taking however her pain would return.   She had no significant change in her symptoms since starting prednisone .  She denied history of acid reflux and reported a sensation of squeezing.  Her pain was random and not related to meals.  She denied associated symptoms such as palpitations.  She noted family history of heart disease, blood clots, sickle cell, heart attacks and high blood pressure.  Her father had heart issues and had heart attack and her grandfather also had heart failure.  Her grandmother died due to allergic reaction to ibuprofen .  Her gestational hypertension in 2020 was reviewed.  She attributed elevated blood pressures to high stress during that time.  At the time of evaluation she was noted to be breast-feeding.  Her EKG showed normal sinus rhythm with ST and T wave abnormality 93 bpm.  Her blood pressure was 114/74.  Echocardiogram was ordered and showed normal LVEF no pericardial effusion and no significant valvular abnormalities.  It was felt that her chest discomfort was not related to cardiac issues.  She presents to the clinic today for follow-up evaluation and states***.  *** denies chest pain, shortness of breath, lower extremity edema, fatigue, palpitations, melena, hematuria, hemoptysis, diaphoresis, weakness, presyncope, syncope, orthopnea, and PND.  Chest discomfort-reassuring emergency department workup and echocardiogram after following up with cardiology as an outpatient.  Echocardiogram showed normal LV EF, no significant valvular abnormalities,  no pericardial effusion and was otherwise reassuring. Patient reassured Continue heart healthy low-sodium diet  Shortness of breath-breathing stable today.  Denies increased DOE.  Shortness of breath appears to be related to patient's decreased conditioning and may also be attributed to anxiety.  Gestational hypertension-BP today***. Heart healthy low-sodium diet Maintain physical activity/increase as tolerated Reviewed secondary causes of  hypertension  Anxiety-mood stable at this time.  Reviewed patient's concerns. Patient reassured that her issues do not appear to be related to cardiac issues. Mindfulness stress reduction resources given  Disposition: Follow-up with Dr. Sheena or me as needed.  Home Medications    Prior to Admission medications   Medication Sig Start Date End Date Taking? Authorizing Provider  butalbital -acetaminophen -caffeine  (FIORICET) 50-325-40 MG tablet Take 1 tablet by mouth every 12 (twelve) hours as needed for headache. Patient not taking: Reported on 10/27/2024 05/06/24   Gregg Lek, MD  cefUROXime  (CEFTIN ) 500 MG tablet Take 1 tablet (500 mg total) by mouth 2 (two) times daily with a meal. Patient not taking: Reported on 10/27/2024 07/23/24   Rudy Carlin LABOR, MD  Cholecalciferol (VITAMIN D3) 10 MCG (400 UNIT) tablet Take 2 tablets by mouth daily. 05/14/24   [provider]  cyclobenzaprine  (FLEXERIL ) 5 MG tablet Take 1 tablet (5 mg total) by mouth at bedtime as needed. Can increase to 10 mg if dose ineffective 10/27/24   Rice, Lonni ORN, MD  famotidine  (PEPCID ) 20 MG tablet Take 1 tablet (20 mg total) by mouth 2 (two) times daily. Patient not taking: Reported on 10/27/2024 07/11/24   Simon Lavonia SAILOR, MD  ibuprofen  (ADVIL ) 800 MG tablet Take 1 tablet (800 mg total) by mouth every 8 (eight) hours as needed. 07/23/24   Rudy Carlin LABOR, MD  metroNIDAZOLE  (FLAGYL ) 500 MG tablet Take 1 tablet (500 mg total) by mouth 2 (two) times daily. Patient not taking: Reported on 10/27/2024 05/22/24   Rudy Carlin LABOR, MD  metroNIDAZOLE  (METROGEL ) 0.75 % vaginal gel Place 1 Applicatorful vaginally 2 (two) times daily. Patient not taking: Reported on 10/27/2024 07/23/24   Rudy Carlin LABOR, MD  nitrofurantoin , macrocrystal-monohydrate, (MACROBID ) 100 MG capsule Take 1 capsule (100 mg total) by mouth 2 (two) times daily. Patient not taking: Reported on 10/27/2024 09/07/24   Ajewole, Christana, MD  predniSONE   (DELTASONE ) 20 MG tablet Take 40 mg by mouth daily. Patient not taking: Reported on 10/27/2024 07/10/24   [provider]  Prenatal Vit-Fe Phos-FA-Omega (VITAFOL  GUMMIES) 3.33-0.333-34.8 MG CHEW Chew 3 tablets by mouth daily before breakfast. 05/20/24   Rudy Carlin LABOR, MD  PROBIOTIC, LACTOBACILLUS, PO Take by mouth.    [provider]    Family History    Family History  Problem Relation Age of Onset   Cancer Mother        cervical ca 2023   Hypertension Mother    Hypertension Father    Sickle cell anemia Father    Sickle cell trait Father        has sickle cell   Heart attack Father        2020   Autism Sister    Diabetes Maternal Grandmother    Hypertension Maternal Grandmother    Heart disease Maternal Grandmother    Fibromyalgia Maternal Grandmother    Hypertension Maternal Grandfather    Pancreatic cancer Maternal Grandfather    Hypertension Paternal Grandmother    Hypertension Paternal Grandfather    Healthy Son    Autism spectrum disorder Daughter  non verbal   Colon cancer Neg Hx    Stomach cancer Neg Hx    Esophageal cancer Neg Hx    Rectal cancer Neg Hx    She indicated that her mother is alive. She indicated that her father is alive. She indicated that all of her four sisters are alive. She indicated that all of her three brothers are alive. She indicated that her maternal grandmother is deceased. She indicated that her maternal grandfather is deceased. She indicated that her paternal grandmother is deceased. She indicated that her paternal grandfather is deceased. She indicated that her daughter is alive. She indicated that her son is alive. She indicated that the status of her neg hx is unknown.  Social History    Social History   Socioeconomic History   Marital status: Single    Spouse name: Not on file   Number of children: Not on file   Years of education: Not on file   Highest education level: Not on file  Occupational History    Not on file  Tobacco Use   Smoking status: Former    Types: Cigars    Passive exposure: Past   Smokeless tobacco: Never   Tobacco comments:    Quit 2022  Vaping Use   Vaping status: Never Used  Substance and Sexual Activity   Alcohol use: Not Currently    Comment: occ   Drug use: No   Sexual activity: Yes    Partners: Male    Birth control/protection: None  Other Topics Concern   Not on file  Social History Narrative   Not on file   Social Drivers of Health   Financial Resource Strain: Not on File (04/19/2022)   Received from [employer]    Financial Resource Strain: 0  Food Insecurity: No Food Insecurity (12/17/2023)   Hunger Vital Sign    Worried About Running Out of Food in the Last Year: Never true    Ran Out of Food in the Last Year: Never true  Transportation Needs: No Transportation Needs (12/17/2023)   PRAPARE - Administrator, Civil Service (Medical): No    Lack of Transportation (Non-Medical): No  Physical Activity: Not on File (04/19/2022)   Received from [facility]   Physical Activity    Physical Activity: 0  Stress: Not on File (04/19/2022)   Received from [facility]   Stress    Stress: 0  Social Connections: Not on File (09/20/2023)   Received from [facility]   Social Connections    Connectedness: 0  Intimate Partner Violence: Not At Risk (12/17/2023)   Humiliation, Afraid, Rape, and Kick questionnaire    Fear of Current or Ex-Partner: No    Emotionally Abused: No    Physically Abused: No    Sexually Abused: No     Review of Systems    General:  No chills, fever, night sweats or weight changes.  Cardiovascular:  No chest pain, dyspnea on exertion, edema, orthopnea, palpitations, paroxysmal nocturnal dyspnea. Dermatological: No rash, lesions/masses Respiratory: No cough, dyspnea Urologic: No hematuria, dysuria Abdominal:   No nausea, vomiting, diarrhea, bright red blood per rectum, melena, or hematemesis Neurologic:  No  visual changes, wkns, changes in mental status. All other systems reviewed and are otherwise negative except as noted above.  Physical Exam    VS:  LMP 10/08/2024  , BMI There is no height or weight on file to calculate BMI. GEN: Well nourished, well developed, in no acute  distress. HEENT: normal. Neck: Supple, no JVD, carotid bruits, or masses. Cardiac: RRR, no murmurs, rubs, or gallops. No clubbing, cyanosis, edema.  Radials/DP/PT 2+ and equal bilaterally.  Respiratory:  Respirations regular and unlabored, clear to auscultation bilaterally. GI: Soft, nontender, nondistended, BS + x 4. MS: no deformity or atrophy. Skin: warm and dry, no rash. Neuro:  Strength and sensation are intact. Psych: Normal affect.  Accessory Clinical Findings    Recent Labs: 07/11/2024: ALT 11; BUN 11; Creatinine, Ser 1.05; Hemoglobin 13.8; Platelets 322; Potassium 4.1; Pro Brain Natriuretic Peptide <36.0; Sodium 137   Recent Lipid Panel No results found for: CHOL, TRIG, HDL, CHOLHDL, VLDL, LDLCALC, LDLDIRECT       ECG personally reviewed by me today- ***    CTA chest 07/11/2024  IMPRESSION: 1. No CT findings for pulmonary embolism. 2. Normal thoracic and abdominal aorta. 3. No acute pulmonary findings. 4. No acute abdominal/pelvic findings, mass lesions or adenopathy. 5. Small amount of free pelvic fluid could be physiologic or related to a ruptured cyst. 6. Incidental central and left paracentral disc protrusion at L4-5.    Echocardiogram 08/25/2024  IMPRESSIONS     1. Left ventricular ejection fraction, by estimation, is 60 to 65%. The  left ventricle has normal function. The left ventricle has no regional  wall motion abnormalities. Left ventricular diastolic parameters were  normal.   2. Right ventricular systolic function is normal. The right ventricular  size is normal.   3. The mitral valve is normal in structure. Trivial mitral valve  regurgitation. No evidence of  mitral stenosis.   4. The aortic valve is tricuspid. Aortic valve regurgitation is not  visualized. No aortic stenosis is present.   5. The inferior vena cava is normal in size with greater than 50%  respiratory variability, suggesting right atrial pressure of 3 mmHg.   FINDINGS   Left Ventricle: Left ventricular ejection fraction, by estimation, is 60  to 65%. The left ventricle has normal function. The left ventricle has no  regional wall motion abnormalities. Strain was performed and the global  longitudinal strain is  indeterminate. The left ventricular internal cavity size was normal in  size. There is no left ventricular hypertrophy. Left ventricular diastolic  parameters were normal.   Right Ventricle: The right ventricular size is normal. No increase in  right ventricular wall thickness. Right ventricular systolic function is  normal.   Left Atrium: Left atrial size was normal in size.   Right Atrium: Right atrial size was normal in size.   Pericardium: There is no evidence of pericardial effusion.   Mitral Valve: The mitral valve is normal in structure. Trivial mitral  valve regurgitation. No evidence of mitral valve stenosis.   Tricuspid Valve: The tricuspid valve is normal in structure. Tricuspid  valve regurgitation is trivial. No evidence of tricuspid stenosis.   Aortic Valve: The aortic valve is tricuspid. Aortic valve regurgitation is  not visualized. No aortic stenosis is present.   Pulmonic Valve: The pulmonic valve was normal in structure. Pulmonic valve  regurgitation is trivial. No evidence of pulmonic stenosis.   Aorta: The aortic root is normal in size and structure.   Venous: The inferior vena cava is normal in size with greater than 50%  respiratory variability, suggesting right atrial pressure of 3 mmHg.   IAS/Shunts: No atrial level shunt detected by color flow Doppler.      Assessment & Plan   1.  ***   Andrea Archer. Tamrah Victorino NP-C  10/28/2024, 1:18 PM [facility] [address] [place], [place] [address] Office [phone]    Notice: This dictation was prepared with Dragon dictation along with smaller phrase technology. Any transcriptional errors that result from this process are unintentional and may not be corrected upon review.   I spent***minutes examining this patient, reviewing medications, and using patient centered shared decision making involving their cardiac care.   I spent  20 minutes reviewing past medical history,  medications, and prior cardiac tests.

## 2024-10-29 ENCOUNTER — Telehealth: Payer: Self-pay

## 2024-10-29 NOTE — Telephone Encounter (Signed)
 Patient left a voicemail asking about lab results, called the patient back and advise that Dr Jeannetta has not yet responded to the results but once he has responded someone from the office would give her a call. Patient verbalized understanding

## 2024-10-30 ENCOUNTER — Ambulatory Visit: Attending: General Practice | Admitting: General Practice

## 2024-10-30 ENCOUNTER — Encounter: Payer: Self-pay | Admitting: General Practice

## 2024-10-30 VITALS — BP 121/82 | HR 88 | Ht 66.5 in | Wt 188.0 lb

## 2024-10-30 DIAGNOSIS — F419 Anxiety disorder, unspecified: Secondary | ICD-10-CM | POA: Insufficient documentation

## 2024-10-30 DIAGNOSIS — R0609 Other forms of dyspnea: Secondary | ICD-10-CM | POA: Diagnosis present

## 2024-10-30 DIAGNOSIS — Z8759 Personal history of other complications of pregnancy, childbirth and the puerperium: Secondary | ICD-10-CM | POA: Diagnosis not present

## 2024-10-30 DIAGNOSIS — R079 Chest pain, unspecified: Secondary | ICD-10-CM | POA: Diagnosis present

## 2024-10-30 NOTE — Patient Instructions (Addendum)
 Medication Instructions:   Continue all current medications.   Labwork:  none  Testing/Procedures:  none  Follow-Up:  As needed   Any Other Special Instructions Will Be Listed Below (If Applicable). Salty six   If you need a refill on your cardiac medications before your next appointment, please call your pharmacy.     PLEASE READ AND FOLLOW ATTACHED  SALTY 6

## 2024-11-02 ENCOUNTER — Encounter: Payer: Self-pay | Admitting: Radiology

## 2024-11-24 ENCOUNTER — Ambulatory Visit

## 2024-11-24 VITALS — BP 122/80 | HR 91 | Wt 188.2 lb

## 2024-11-24 DIAGNOSIS — R3915 Urgency of urination: Secondary | ICD-10-CM

## 2024-11-24 LAB — POCT URINALYSIS DIPSTICK
Bilirubin, UA: NEGATIVE
Blood, UA: NEGATIVE
Glucose, UA: NEGATIVE
Ketones, UA: NEGATIVE
Nitrite, UA: NEGATIVE
Odor: NEGATIVE
Protein, UA: NEGATIVE
Spec Grav, UA: 1.015 (ref 1.010–1.025)
Urobilinogen, UA: 0.2 U/dL
pH, UA: 6 (ref 5.0–8.0)

## 2024-11-24 NOTE — Progress Notes (Signed)
..  SUBJECTIVE: Andrea Archer is a 23 y.o. female who complains of urinary frequency, urgency x 1 month, without flank pain, fever, chills, or abnormal vaginal discharge or bleeding. Pt was also seen by PCP in September for this ongoing issue and was referred to Urology. Pt states that no one has called her about an appt.   OBJECTIVE: Appears well, in no apparent distress.  Vital signs are normal. Urine dipstick shows positive for leukocytes.    ASSESSMENT: Dysuria  PLAN: Treatment per orders.  Call or return to clinic prn if these symptoms worsen or fail to improve as anticipated. Provided pt with referral information to contact office for appt and advised urine culture would be sent to lab.

## 2024-12-05 ENCOUNTER — Other Ambulatory Visit: Payer: Self-pay

## 2024-12-05 ENCOUNTER — Emergency Department (HOSPITAL_BASED_OUTPATIENT_CLINIC_OR_DEPARTMENT_OTHER)

## 2024-12-05 ENCOUNTER — Encounter (HOSPITAL_BASED_OUTPATIENT_CLINIC_OR_DEPARTMENT_OTHER): Payer: Self-pay | Admitting: Emergency Medicine

## 2024-12-05 ENCOUNTER — Emergency Department (HOSPITAL_BASED_OUTPATIENT_CLINIC_OR_DEPARTMENT_OTHER)
Admission: EM | Admit: 2024-12-05 | Discharge: 2024-12-06 | Disposition: A | Attending: Emergency Medicine | Admitting: Emergency Medicine

## 2024-12-05 DIAGNOSIS — R1031 Right lower quadrant pain: Secondary | ICD-10-CM | POA: Insufficient documentation

## 2024-12-05 DIAGNOSIS — R110 Nausea: Secondary | ICD-10-CM | POA: Insufficient documentation

## 2024-12-05 LAB — CBC WITH DIFFERENTIAL/PLATELET
Abs Immature Granulocytes: 0.02 K/uL (ref 0.00–0.07)
Basophils Absolute: 0.1 K/uL (ref 0.0–0.1)
Basophils Relative: 1 %
Eosinophils Absolute: 0.6 K/uL — ABNORMAL HIGH (ref 0.0–0.5)
Eosinophils Relative: 7 %
HCT: 34.8 % — ABNORMAL LOW (ref 36.0–46.0)
Hemoglobin: 12.1 g/dL (ref 12.0–15.0)
Immature Granulocytes: 0 %
Lymphocytes Relative: 36 %
Lymphs Abs: 3.2 K/uL (ref 0.7–4.0)
MCH: 28.5 pg (ref 26.0–34.0)
MCHC: 34.8 g/dL (ref 30.0–36.0)
MCV: 82.1 fL (ref 80.0–100.0)
Monocytes Absolute: 0.5 K/uL (ref 0.1–1.0)
Monocytes Relative: 6 %
Neutro Abs: 4.4 K/uL (ref 1.7–7.7)
Neutrophils Relative %: 50 %
Platelets: 247 K/uL (ref 150–400)
RBC: 4.24 MIL/uL (ref 3.87–5.11)
RDW: 14 % (ref 11.5–15.5)
WBC: 8.8 K/uL (ref 4.0–10.5)
nRBC: 0 % (ref 0.0–0.2)

## 2024-12-05 LAB — URINALYSIS, W/ REFLEX TO CULTURE (INFECTION SUSPECTED)
Bacteria, UA: NONE SEEN
Bilirubin Urine: NEGATIVE
Glucose, UA: NEGATIVE mg/dL
Hgb urine dipstick: NEGATIVE
Ketones, ur: NEGATIVE mg/dL
Leukocytes,Ua: NEGATIVE
Nitrite: NEGATIVE
Protein, ur: NEGATIVE mg/dL
Specific Gravity, Urine: 1.011 (ref 1.005–1.030)
pH: 6.5 (ref 5.0–8.0)

## 2024-12-05 LAB — COMPREHENSIVE METABOLIC PANEL WITH GFR
ALT: 12 U/L (ref 0–44)
AST: 17 U/L (ref 15–41)
Albumin: 3.9 g/dL (ref 3.5–5.0)
Alkaline Phosphatase: 72 U/L (ref 38–126)
Anion gap: 9 (ref 5–15)
BUN: 8 mg/dL (ref 6–20)
CO2: 26 mmol/L (ref 22–32)
Calcium: 9.2 mg/dL (ref 8.9–10.3)
Chloride: 104 mmol/L (ref 98–111)
Creatinine, Ser: 0.91 mg/dL (ref 0.44–1.00)
GFR, Estimated: >60 mL/min (ref >60)
Glucose, Bld: 100 mg/dL — ABNORMAL HIGH (ref 70–99)
Potassium: 4 mmol/L (ref 3.5–5.1)
Sodium: 139 mmol/L (ref 135–145)
Total Bilirubin: 0.3 mg/dL (ref 0.0–1.2)
Total Protein: 6.4 g/dL — ABNORMAL LOW (ref 6.5–8.1)

## 2024-12-05 LAB — LIPASE, BLOOD: Lipase: 43 U/L (ref 11–51)

## 2024-12-05 LAB — HCG, SERUM, QUALITATIVE: Preg, Serum: NEGATIVE

## 2024-12-05 MED ORDER — KETOROLAC TROMETHAMINE 15 MG/ML IJ SOLN
15.0000 mg | Freq: Once | INTRAMUSCULAR | Status: AC
  Administered 2024-12-05: 15 mg via INTRAVENOUS
  Filled 2024-12-05: qty 1

## 2024-12-05 MED ORDER — ONDANSETRON HCL 4 MG/2ML IJ SOLN
4.0000 mg | Freq: Once | INTRAMUSCULAR | Status: AC
  Administered 2024-12-05: 4 mg via INTRAVENOUS
  Filled 2024-12-05: qty 2

## 2024-12-05 MED ORDER — IOHEXOL 300 MG/ML  SOLN
100.0000 mL | Freq: Once | INTRAMUSCULAR | Status: AC | PRN
  Administered 2024-12-05: 100 mL via INTRAVENOUS

## 2024-12-05 MED ORDER — SODIUM CHLORIDE 0.9 % IV BOLUS
1000.0000 mL | Freq: Once | INTRAVENOUS | Status: AC
  Administered 2024-12-05: 1000 mL via INTRAVENOUS

## 2024-12-05 MED ORDER — MORPHINE SULFATE (PF) 4 MG/ML IV SOLN: 4.0000 mg | Freq: Once | INTRAVENOUS | Status: DC

## 2024-12-05 MED ORDER — MORPHINE SULFATE (PF) 4 MG/ML IV SOLN
4.0000 mg | Freq: Once | INTRAVENOUS | Status: AC
  Administered 2024-12-05: 4 mg via INTRAVENOUS
  Filled 2024-12-05: qty 1

## 2024-12-05 NOTE — ED Provider Notes (Signed)
 [place] EMERGENCY DEPARTMENT AT [facility] Provider Note   CSN: [id number] Arrival date & time: 12/05/24  2026    Patient presents with: Abdominal Pain and Nausea   Andrea Archer is a 23 y.o. female hx of fibromyalgia, recurrent urinary urgency here for evaluation of right lower quadrant abdominal pain.  Started around noon today.  Waxes and wanes in intensity however has intermittent sharp, stabbing sensations as well as dull aching.  Some nausea without vomiting.  Has not taken any medicines at home.  Pain radiates into her groin, when she urinates she feels like she has have a bowel movement.  Rates pain a 9/10.  No dysuria, hematuria.  No vaginal discharge, bleeding or concern for STI.  Denies chance of pregnancy, not currently breastfeeding. LMP 12/04/24. Still has ovaries, appendix.  OB/GYN-Women's health care at [facility] Dr. Rudy   HPI     Prior to Admission medications   Medication Sig Start Date End Date Taking? Authorizing Provider  butalbital -acetaminophen -caffeine  (FIORICET) 50-325-40 MG tablet Take 1 tablet by mouth every 12 (twelve) hours as needed for headache. Patient not taking: Reported on 10/30/2024 05/06/24   Camara, Amadou, MD  cefUROXime  (CEFTIN ) 500 MG tablet Take 1 tablet (500 mg total) by mouth 2 (two) times daily with a meal. Patient not taking: Reported on 10/30/2024 07/23/24   Rudy Carlin LABOR, MD  Cholecalciferol (VITAMIN D3) 10 MCG (400 UNIT) tablet Take 2 tablets by mouth daily. 05/14/24   [provider]  cyclobenzaprine  (FLEXERIL ) 5 MG tablet Take 1 tablet (5 mg total) by mouth at bedtime as needed. Can increase to 10 mg if dose ineffective Patient not taking: Reported on 10/30/2024 10/27/24   Jeannetta Lonni ORN, MD  famotidine  (PEPCID ) 20 MG tablet Take 1 tablet (20 mg total) by mouth 2 (two) times daily. Patient not taking: Reported on 10/30/2024 07/11/24   Simon Lavonia SAILOR, MD  ibuprofen  (ADVIL ) 800 MG tablet Take 1 tablet (800 mg total)  by mouth every 8 (eight) hours as needed. 07/23/24   Rudy Carlin LABOR, MD  metroNIDAZOLE  (FLAGYL ) 500 MG tablet Take 1 tablet (500 mg total) by mouth 2 (two) times daily. Patient not taking: Reported on 10/30/2024 05/22/24   Rudy Carlin LABOR, MD  metroNIDAZOLE  (METROGEL ) 0.75 % vaginal gel Place 1 Applicatorful vaginally 2 (two) times daily. Patient not taking: Reported on 10/30/2024 07/23/24   Rudy Carlin LABOR, MD  nitrofurantoin , macrocrystal-monohydrate, (MACROBID ) 100 MG capsule Take 1 capsule (100 mg total) by mouth 2 (two) times daily. Patient not taking: Reported on 10/30/2024 09/07/24   Ajewole, Christana, MD  predniSONE  (DELTASONE ) 20 MG tablet Take 40 mg by mouth daily. Patient not taking: Reported on 10/30/2024 07/10/24   [provider]  Prenatal Vit-Fe Phos-FA-Omega (VITAFOL  GUMMIES) 3.33-0.333-34.8 MG CHEW Chew 3 tablets by mouth daily before breakfast. 05/20/24   Rudy Carlin LABOR, MD  PROBIOTIC, LACTOBACILLUS, PO Take by mouth.    [provider]    Allergies: Other, Gabapentin , Mushroom, and Pineapple    Review of Systems  Constitutional: Negative.   HENT: Negative.    Respiratory: Negative.    Cardiovascular: Negative.   Gastrointestinal:  Positive for abdominal pain and nausea. Negative for abdominal distention, anal bleeding, blood in stool, constipation, diarrhea, rectal pain and vomiting.  Genitourinary: Negative.   Musculoskeletal: Negative.   Skin: Negative.   Neurological: Negative.   All other systems reviewed and are negative.   Updated Vital Signs BP 123/64   Pulse 80   Temp 98.3  F (36.8 C) (Oral)   Resp 19   LMP 12/04/2024   SpO2 100%   Physical Exam Vitals and nursing note reviewed.  Constitutional:      General: She is not in acute distress.    Appearance: She is well-developed. She is not ill-appearing, toxic-appearing or diaphoretic.  HENT:     Head: Normocephalic and atraumatic.  Eyes:     Pupils: Pupils are equal, round,  and reactive to light.  Cardiovascular:     Rate and Rhythm: Normal rate.     Heart sounds: Normal heart sounds.  Pulmonary:     Effort: Pulmonary effort is normal. No respiratory distress.     Breath sounds: Normal breath sounds. No stridor.  Abdominal:     General: Bowel sounds are normal. There is no distension.     Palpations: Abdomen is soft.     Tenderness: There is abdominal tenderness in the right lower quadrant. There is no right CVA tenderness, left CVA tenderness, guarding or rebound.  Genitourinary:    Comments: Pt Deferred  Musculoskeletal:        General: Normal range of motion.     Cervical back: Normal range of motion.  Skin:    General: Skin is warm and dry.  Neurological:     General: No focal deficit present.     Mental Status: She is alert.  Psychiatric:        Mood and Affect: Mood normal.    (all labs ordered are listed, but only abnormal results are displayed) Labs Reviewed  URINALYSIS, W/ REFLEX TO CULTURE (INFECTION SUSPECTED) - Abnormal; Notable for the following components:      Result Value   Color, Urine COLORLESS (*)    Crystals PRESENT (*)    All other components within normal limits  CBC WITH DIFFERENTIAL/PLATELET - Abnormal; Notable for the following components:   HCT 34.8 (*)    Eosinophils Absolute 0.6 (*)    All other components within normal limits  COMPREHENSIVE METABOLIC PANEL WITH GFR - Abnormal; Notable for the following components:   Glucose, Bld 100 (*)    Total Protein 6.4 (*)    All other components within normal limits  LIPASE, BLOOD  HCG, SERUM, QUALITATIVE    EKG: None  Radiology: CT ABDOMEN PELVIS W CONTRAST Result Date: 12/05/2024 EXAM: CT ABDOMEN AND PELVIS WITH CONTRAST 12/05/2024 11:09:19 PM TECHNIQUE: CT of the abdomen and pelvis was performed with the administration of 100 mL of iohexol  (OMNIPAQUE ) 300 MG/ML solution. Multiplanar reformatted images are provided for review. Automated exposure control, iterative  reconstruction, and/or weight-based adjustment of the mA/kV was utilized to reduce the radiation dose to as low as reasonably achievable. COMPARISON: 07/11/2024 CLINICAL HISTORY: RLQ abdominal pain. FINDINGS: LOWER CHEST: No acute abnormality. LIVER: The liver is unremarkable. GALLBLADDER AND BILE DUCTS: Gallbladder is unremarkable. No biliary ductal dilatation. SPLEEN: No acute abnormality. PANCREAS: No acute abnormality. ADRENAL GLANDS: No acute abnormality. KIDNEYS, URETERS AND BLADDER: No stones in the kidneys or ureters. No hydronephrosis. No perinephric or periureteral stranding. Urinary bladder is unremarkable. GI AND BOWEL: Stomach demonstrates no acute abnormality. There is no bowel obstruction. APPENDIX: Normal appendix. PERITONEUM AND RETROPERITONEUM: No ascites. No free air. VASCULATURE: Aorta is normal in caliber. LYMPH NODES: No lymphadenopathy. REPRODUCTIVE ORGANS: No acute abnormality. BONES AND SOFT TISSUES: No acute osseous abnormality. No focal soft tissue abnormality. IMPRESSION: 1. No acute findings in the abdomen or pelvis. 2. Normal appendix. Electronically signed by: Franky Crease MD 12/05/2024 11:13 PM EST  RP Workstation: [id number]     Procedures   Medications Ordered in the ED  ondansetron  (ZOFRAN ) injection 4 mg (4 mg Intravenous Given 12/05/24 2120)  sodium chloride  0.9 % bolus 1,000 mL (0 mLs Intravenous Stopped 12/05/24 2317)  morphine  (PF) 4 MG/ML injection 4 mg (4 mg Intravenous Given 12/05/24 2120)  iohexol  (OMNIPAQUE ) 300 MG/ML solution 100 mL (100 mLs Intravenous Contrast Given 12/05/24 2300)  ketorolac  (TORADOL ) 15 MG/ML injection 15 mg (15 mg Intravenous Given 12/05/24 3847)   23 year old here for evaluation of right lower quadrant pain started around 12:00 today.  Dull, achy however with intermittent waxing and waning sharp pains.  Some nausea that vomiting.  No meds OTC.  No changes in bowel movements.  No dysuria however has noted a smell to her urine.  No vaginal  discharge, bleeding, denies chance of pregnancy.  Plan on labs, imaging, reassess  Labs and imaging personally viewed and interpreted:  CBC without leukocytosis Metabolic panel without significant abnormality Lipase 43 Pregnancy test negative UA negative for infection  Patient reassessed.  Still states she is having moderate pain in her right lower quadrant.  Pt defers pelvic exam as she states she has no concern for STIs.  Ultrasound not available at this facility.  Will transfer to [facility] for ultrasound -torsion study.  EDP excepting at [facility].  She does not have a ride at this time as her ride left with her child.  Patient will be transported via [facility].  She is requesting additional pain management however does not want narcotic medicine.  Will give Toradol .  Anticipate if remaining workup is negative patient can DC home with follow-up outpatient   Clinical Course as of 12/05/24 2333  Sat Dec 05, 2024  2110 Patient states she is not breast-feeding anymore [BH]    Clinical Course User Index [BH] Amirrah Quigley A, PA-C                                 Medical Decision Making Amount and/or Complexity of Data Reviewed Independent Historian: friend External Data Reviewed: labs, radiology and notes. Labs: ordered. Decision-making details documented in ED Course. Radiology: ordered and independent interpretation performed. Decision-making details documented in ED Course.  Risk OTC drugs. Prescription drug management. Parenteral controlled substances. Decision regarding hospitalization. Diagnosis or treatment significantly limited by social determinants of health.       Final diagnoses:  RLQ abdominal pain    ED Discharge Orders     None          Brinn Westby A, PA-C 12/05/24 2342    Ruthe Cornet, DO 12/06/24 1457

## 2024-12-05 NOTE — ED Triage Notes (Signed)
  Patient comes in with RLQ abdominal pain that started around noon today.  Patient states the pain comes in waves and is sharp/stabbing.  Endorses nausea with no emesis.  No OTC meds taken.  Denies any dysuria but states she noticed a strong metallic smell in her urine.  Denies any vaginal discharge or bleeding.  Pain 9/10, sharp/stabbing.

## 2024-12-06 ENCOUNTER — Emergency Department (HOSPITAL_COMMUNITY)

## 2024-12-06 MED ORDER — DICYCLOMINE HCL 20 MG PO TABS: 20.0000 mg | ORAL_TABLET | Freq: Two times a day (BID) | ORAL | 0 refills | Status: AC | PRN

## 2024-12-06 NOTE — Discharge Instructions (Signed)
 Your workup in the emergency department today was reassuring.  We recommend ibuprofen  or Aleve  for management of pain.  Follow-up with your primary care doctor.  You may return for new or concerning symptoms.

## 2024-12-06 NOTE — ED Triage Notes (Signed)
 Transferred from [facility] POV/ needing US  for right ovarian pain/ pt appears to be in a lot of pain/ pt placed in wheelchair

## 2024-12-06 NOTE — ED Notes (Signed)
 Patient transported to Ultrasound

## 2024-12-06 NOTE — ED Provider Notes (Signed)
 1:56 AM Patient care assumed in transfer from [facility] emergency department. Transferred for pelvic US  to r/o torsion. CT abd/pelvis already completed which was negative. Laboratory work up and urinalysis have been unremarkable.  2:36 AM Pelvic ultrasound negative for torsion. Patient stable for discharge and outpatient follow up.  Results for orders placed or performed during the hospital encounter of 12/05/24  Urinalysis, w/ Reflex to Culture (Infection Suspected) -Urine, Clean Catch   Collection Time: 12/05/24  9:20 PM  Result Value Ref Range   Specimen Source URINE, CLEAN CATCH    Color, Urine COLORLESS (A) YELLOW   APPearance CLEAR CLEAR   Specific Gravity, Urine 1.011 1.005 - 1.030   pH 6.5 5.0 - 8.0   Glucose, UA NEGATIVE NEGATIVE mg/dL   Hgb urine dipstick NEGATIVE NEGATIVE   Bilirubin Urine NEGATIVE NEGATIVE   Ketones, ur NEGATIVE NEGATIVE mg/dL   Protein, ur NEGATIVE NEGATIVE mg/dL   Nitrite NEGATIVE NEGATIVE   Leukocytes,Ua NEGATIVE NEGATIVE   RBC / HPF 0-5 0 - 5 RBC/hpf   WBC, UA 0-5 0 - 5 WBC/hpf   Bacteria, UA NONE SEEN NONE SEEN   Squamous Epithelial / HPF 0-5 0 - 5 /HPF   Crystals PRESENT (A) NEGATIVE  CBC with Differential   Collection Time: 12/05/24  9:20 PM  Result Value Ref Range   WBC 8.8 4.0 - 10.5 K/uL   RBC 4.24 3.87 - 5.11 MIL/uL   Hemoglobin 12.1 12.0 - 15.0 g/dL   HCT 65.1 (L) 63.9 - 53.9 %   MCV 82.1 80.0 - 100.0 fL   MCH 28.5 26.0 - 34.0 pg   MCHC 34.8 30.0 - 36.0 g/dL   RDW 85.9 88.4 - 84.4 %   Platelets 247 150 - 400 K/uL   nRBC 0.0 0.0 - 0.2 %   Neutrophils Relative % 50 %   Neutro Abs 4.4 1.7 - 7.7 K/uL   Lymphocytes Relative 36 %   Lymphs Abs 3.2 0.7 - 4.0 K/uL   Monocytes Relative 6 %   Monocytes Absolute 0.5 0.1 - 1.0 K/uL   Eosinophils Relative 7 %   Eosinophils Absolute 0.6 (H) 0.0 - 0.5 K/uL   Basophils Relative 1 %   Basophils Absolute 0.1 0.0 - 0.1 K/uL   Immature Granulocytes 0 %   Abs Immature Granulocytes 0.02 0.00 - 0.07 K/uL   Comprehensive metabolic panel   Collection Time: 12/05/24  9:20 PM  Result Value Ref Range   Sodium 139 135 - 145 mmol/L   Potassium 4.0 3.5 - 5.1 mmol/L   Chloride 104 98 - 111 mmol/L   CO2 26 22 - 32 mmol/L   Glucose, Bld 100 (H) 70 - 99 mg/dL   BUN 8 6 - 20 mg/dL   Creatinine, Ser 9.08 0.44 - 1.00 mg/dL   Calcium  9.2 8.9 - 10.3 mg/dL   Total Protein 6.4 (L) 6.5 - 8.1 g/dL   Albumin 3.9 3.5 - 5.0 g/dL   AST 17 15 - 41 U/L   ALT 12 0 - 44 U/L   Alkaline Phosphatase 72 38 - 126 U/L   Total Bilirubin 0.3 0.0 - 1.2 mg/dL   GFR, Estimated >39 >39 mL/min   Anion gap 9 5 - 15  Lipase, blood   Collection Time: 12/05/24  9:20 PM  Result Value Ref Range   Lipase 43 11 - 51 U/L  hCG, serum, qualitative   Collection Time: 12/05/24  9:20 PM  Result Value Ref Range   Preg, Serum NEGATIVE NEGATIVE  US  PELVIC COMPLETE W TRANSVAGINAL AND TORSION R/O Result Date: 12/06/2024 EXAM: US  Pelvis, Complete Transvaginal and Transabdominal with Doppler 12/06/2024 01:56:10 AM TECHNIQUE: Transabdominal and transvaginal pelvic duplex ultrasound using B-mode/gray scaled imaging with Doppler spectral analysis and color flow was obtained. COMPARISON: CT abdomen and pelvis 12/05/24 Ultrasound pelvis  07/31/24 CLINICAL HISTORY: Pelvic pain. FINDINGS: UTERUS: The uterus measures 8.8 x 4.3 x 5 cm, volume 98 ml. Uterus demonstrates normal myometrial echotexture. ENDOMETRIAL STRIPE: The endometrium measures 7 mm. Endometrial stripe is within normal limits. RIGHT OVARY: The right ovary measures 2.7 x 2.7 x 3 cm with a volume of 11 ml. There is normal arterial and venous Doppler flow. LEFT OVARY: The left ovary measures 4.1 x 1.8 x 1.6 cm, with a volume of 6 ml. There is normal arterial and venous Doppler flow. FREE FLUID: Small volume free fluid. IMPRESSION: 1. No acute sonographic explanation for pelvic pain. Electronically signed by: Morgane Naveau MD 12/06/2024 02:08 AM EST RP Workstation: [id number]   CT ABDOMEN PELVIS  W CONTRAST Result Date: 12/05/2024 EXAM: CT ABDOMEN AND PELVIS WITH CONTRAST 12/05/2024 11:09:19 PM TECHNIQUE: CT of the abdomen and pelvis was performed with the administration of 100 mL of iohexol  (OMNIPAQUE ) 300 MG/ML solution. Multiplanar reformatted images are provided for review. Automated exposure control, iterative reconstruction, and/or weight-based adjustment of the mA/kV was utilized to reduce the radiation dose to as low as reasonably achievable. COMPARISON: 07/11/2024 CLINICAL HISTORY: RLQ abdominal pain. FINDINGS: LOWER CHEST: No acute abnormality. LIVER: The liver is unremarkable. GALLBLADDER AND BILE DUCTS: Gallbladder is unremarkable. No biliary ductal dilatation. SPLEEN: No acute abnormality. PANCREAS: No acute abnormality. ADRENAL GLANDS: No acute abnormality. KIDNEYS, URETERS AND BLADDER: No stones in the kidneys or ureters. No hydronephrosis. No perinephric or periureteral stranding. Urinary bladder is unremarkable. GI AND BOWEL: Stomach demonstrates no acute abnormality. There is no bowel obstruction. APPENDIX: Normal appendix. PERITONEUM AND RETROPERITONEUM: No ascites. No free air. VASCULATURE: Aorta is normal in caliber. LYMPH NODES: No lymphadenopathy. REPRODUCTIVE ORGANS: No acute abnormality. BONES AND SOFT TISSUES: No acute osseous abnormality. No focal soft tissue abnormality. IMPRESSION: 1. No acute findings in the abdomen or pelvis. 2. Normal appendix. Electronically signed by: Franky Crease MD 12/05/2024 11:13 PM EST RP Workstation: [id number]      Keith Sor, PA-C 12/06/24 0236    Trine Raynell Moder, MD 12/06/24 0730

## 2024-12-11 ENCOUNTER — Other Ambulatory Visit: Payer: Self-pay

## 2024-12-11 ENCOUNTER — Emergency Department (HOSPITAL_BASED_OUTPATIENT_CLINIC_OR_DEPARTMENT_OTHER)

## 2024-12-11 ENCOUNTER — Emergency Department (HOSPITAL_BASED_OUTPATIENT_CLINIC_OR_DEPARTMENT_OTHER)
Admission: EM | Admit: 2024-12-11 | Discharge: 2024-12-11 | Disposition: A | Discharge status: Home / Self Care | Attending: Emergency Medicine | Admitting: Emergency Medicine

## 2024-12-11 DIAGNOSIS — R1031 Right lower quadrant pain: Secondary | ICD-10-CM | POA: Insufficient documentation

## 2024-12-11 LAB — CBC WITH DIFFERENTIAL/PLATELET
Abs Immature Granulocytes: 0.04 K/uL (ref 0.00–0.07)
Basophils Absolute: 0.1 K/uL (ref 0.0–0.1)
Basophils Relative: 1 %
Eosinophils Absolute: 0.5 K/uL (ref 0.0–0.5)
Eosinophils Relative: 5 %
HCT: 39.6 % (ref 36.0–46.0)
Hemoglobin: 13.8 g/dL (ref 12.0–15.0)
Immature Granulocytes: 0 %
Lymphocytes Relative: 33 %
Lymphs Abs: 3.4 K/uL (ref 0.7–4.0)
MCH: 28.3 pg (ref 26.0–34.0)
MCHC: 34.8 g/dL (ref 30.0–36.0)
MCV: 81.1 fL (ref 80.0–100.0)
Monocytes Absolute: 0.7 K/uL (ref 0.1–1.0)
Monocytes Relative: 6 %
Neutro Abs: 5.7 K/uL (ref 1.7–7.7)
Neutrophils Relative %: 55 %
Platelets: 325 K/uL (ref 150–400)
RBC: 4.88 MIL/uL (ref 3.87–5.11)
RDW: 13.7 % (ref 11.5–15.5)
WBC: 10.4 K/uL (ref 4.0–10.5)
nRBC: 0 % (ref 0.0–0.2)

## 2024-12-11 LAB — URINALYSIS, ROUTINE W REFLEX MICROSCOPIC
Bilirubin Urine: NEGATIVE
Glucose, UA: NEGATIVE mg/dL
Hgb urine dipstick: NEGATIVE
Ketones, ur: NEGATIVE mg/dL
Leukocytes,Ua: NEGATIVE
Nitrite: NEGATIVE
Protein, ur: NEGATIVE mg/dL
Specific Gravity, Urine: 1.011 (ref 1.005–1.030)
pH: 6.5 (ref 5.0–8.0)

## 2024-12-11 LAB — COMPREHENSIVE METABOLIC PANEL WITH GFR
ALT: 12 U/L (ref 0–44)
AST: 18 U/L (ref 15–41)
Albumin: 4.5 g/dL (ref 3.5–5.0)
Alkaline Phosphatase: 73 U/L (ref 38–126)
Anion gap: 11 (ref 5–15)
BUN: 13 mg/dL (ref 6–20)
CO2: 24 mmol/L (ref 22–32)
Calcium: 10 mg/dL (ref 8.9–10.3)
Chloride: 104 mmol/L (ref 98–111)
Creatinine, Ser: 1.03 mg/dL — ABNORMAL HIGH (ref 0.44–1.00)
GFR, Estimated: >60 mL/min (ref >60)
Glucose, Bld: 94 mg/dL (ref 70–99)
Potassium: 4.1 mmol/L (ref 3.5–5.1)
Sodium: 138 mmol/L (ref 135–145)
Total Bilirubin: 0.4 mg/dL (ref 0.0–1.2)
Total Protein: 7.5 g/dL (ref 6.5–8.1)

## 2024-12-11 LAB — HCG, SERUM, QUALITATIVE: Preg, Serum: NEGATIVE

## 2024-12-11 MED ORDER — KETOROLAC TROMETHAMINE 15 MG/ML IJ SOLN
30.0000 mg | Freq: Once | INTRAMUSCULAR | Status: AC
  Administered 2024-12-11: 30 mg via INTRAVENOUS
  Filled 2024-12-11: qty 2

## 2024-12-11 NOTE — ED Triage Notes (Signed)
 Pt POV reporting persistent RLQ abd pain, seen 12/7 for same, CT and blood work neg, called PCP and sent to ED to rule out kidney stones.

## 2024-12-11 NOTE — ED Provider Notes (Signed)
 [place] EMERGENCY DEPARTMENT AT [facility] Provider Note   CSN: [id number] Arrival date & time: 12/11/24  1818     Patient presents with: Abdominal Pain   Andrea Archer is a 23 y.o. female.  Presents today for persistent right lower quadrant abdominal pain.  Patient was seen 12/6-12/7 for same with negative CT and pelvic ultrasound.  Patient was seen at her PCP and sent here for kidney stone rule out.    Abdominal Pain      Prior to Admission medications  Medication Sig Start Date End Date Taking? Authorizing Provider  butalbital -acetaminophen -caffeine  (FIORICET) 50-325-40 MG tablet Take 1 tablet by mouth every 12 (twelve) hours as needed for headache. Patient not taking: Reported on 10/30/2024 05/06/24   Camara, Amadou, MD  cefUROXime  (CEFTIN ) 500 MG tablet Take 1 tablet (500 mg total) by mouth 2 (two) times daily with a meal. Patient not taking: Reported on 10/30/2024 07/23/24   Rudy Carlin LABOR, MD  Cholecalciferol (VITAMIN D3) 10 MCG (400 UNIT) tablet Take 2 tablets by mouth daily. 05/14/24   [provider]  cyclobenzaprine  (FLEXERIL ) 5 MG tablet Take 1 tablet (5 mg total) by mouth at bedtime as needed. Can increase to 10 mg if dose ineffective Patient not taking: Reported on 10/30/2024 10/27/24   Jeannetta Lonni ORN, MD  dicyclomine  (BENTYL ) 20 MG tablet Take 1 tablet (20 mg total) by mouth every 12 (twelve) hours as needed (for abdominal pain/cramping). 12/06/24   Madisin Hasan Sor, PA-C  famotidine  (PEPCID ) 20 MG tablet Take 1 tablet (20 mg total) by mouth 2 (two) times daily. Patient not taking: Reported on 10/30/2024 07/11/24   Simon Lavonia SAILOR, MD  ibuprofen  (ADVIL ) 800 MG tablet Take 1 tablet (800 mg total) by mouth every 8 (eight) hours as needed. 07/23/24   Rudy Carlin LABOR, MD  metroNIDAZOLE  (FLAGYL ) 500 MG tablet Take 1 tablet (500 mg total) by mouth 2 (two) times daily. Patient not taking: Reported on 10/30/2024 05/22/24   Rudy Carlin LABOR, MD   metroNIDAZOLE  (METROGEL ) 0.75 % vaginal gel Place 1 Applicatorful vaginally 2 (two) times daily. Patient not taking: Reported on 10/30/2024 07/23/24   Rudy Carlin LABOR, MD  nitrofurantoin , macrocrystal-monohydrate, (MACROBID ) 100 MG capsule Take 1 capsule (100 mg total) by mouth 2 (two) times daily. Patient not taking: Reported on 10/30/2024 09/07/24   Ajewole, Christana, MD  predniSONE  (DELTASONE ) 20 MG tablet Take 40 mg by mouth daily. Patient not taking: Reported on 10/30/2024 07/10/24   [provider]  Prenatal Vit-Fe Phos-FA-Omega (VITAFOL  GUMMIES) 3.33-0.333-34.8 MG CHEW Chew 3 tablets by mouth daily before breakfast. 05/20/24   Rudy Carlin LABOR, MD  PROBIOTIC, LACTOBACILLUS, PO Take by mouth.    [provider]    Allergies: Other, Gabapentin , Mushroom, and Pineapple    Review of Systems  Gastrointestinal:  Positive for abdominal pain.    Updated Vital Signs BP 101/83   Pulse 95   Temp 98.4 F (36.9 C) (Oral)   Resp 19   LMP 12/04/2024   SpO2 100%   Physical Exam Vitals and nursing note reviewed.  Constitutional:      General: She is not in acute distress.    Appearance: She is well-developed. She is not toxic-appearing.  HENT:     Head: Normocephalic and atraumatic.  Eyes:     Conjunctiva/sclera: Conjunctivae normal.  Cardiovascular:     Rate and Rhythm: Normal rate and regular rhythm.     Heart sounds: Normal heart sounds. No murmur heard. Pulmonary:  Effort: Pulmonary effort is normal. No respiratory distress.     Breath sounds: Normal breath sounds.  Abdominal:     General: There is no distension.     Palpations: Abdomen is soft.     Tenderness: There is abdominal tenderness in the right lower quadrant. There is right CVA tenderness.  Musculoskeletal:        General: No swelling.     Cervical back: Neck supple.  Skin:    General: Skin is warm and dry.     Capillary Refill: Capillary refill takes less than 2 seconds.  Neurological:      General: No focal deficit present.     Mental Status: She is alert and oriented to person, place, and time.  Psychiatric:        Mood and Affect: Mood normal.     (all labs ordered are listed, but only abnormal results are displayed) Labs Reviewed  COMPREHENSIVE METABOLIC PANEL WITH GFR - Abnormal; Notable for the following components:      Result Value   Creatinine, Ser 1.03 (*)    All other components within normal limits  URINALYSIS, ROUTINE W REFLEX MICROSCOPIC - Abnormal; Notable for the following components:   Color, Urine COLORLESS (*)    All other components within normal limits  CBC WITH DIFFERENTIAL/PLATELET  HCG, SERUM, QUALITATIVE    EKG: None  Radiology: CT Renal Stone Study Result Date: 12/11/2024 EXAM: CT ABDOMEN AND PELVIS WITHOUT CONTRAST 12/11/2024 07:09:02 PM TECHNIQUE: CT of the abdomen and pelvis was performed without the administration of intravenous contrast. Multiplanar reformatted images are provided for review. Automated exposure control, iterative reconstruction, and/or weight-based adjustment of the mA/kV was utilized to reduce the radiation dose to as low as reasonably achievable. COMPARISON: None available. CLINICAL HISTORY: Abdominal/flank pain, stone suspected. FINDINGS: LOWER CHEST: No acute abnormality. LIVER: The liver is unremarkable. GALLBLADDER AND BILE DUCTS: Gallbladder is unremarkable. No biliary ductal dilatation. SPLEEN: No acute abnormality. PANCREAS: No acute abnormality. ADRENAL GLANDS: No acute abnormality. KIDNEYS, URETERS AND BLADDER: No stones in the kidneys or ureters. No hydronephrosis. No perinephric or periureteral stranding. Urinary bladder is unremarkable. GI AND BOWEL: Stomach demonstrates no acute abnormality. The appendix is within normal limits. There is no bowel obstruction. PERITONEUM AND RETROPERITONEUM: No ascites. No free air. VASCULATURE: Aorta is normal in caliber. LYMPH NODES: No lymphadenopathy. REPRODUCTIVE ORGANS: No  acute abnormality. BONES AND SOFT TISSUES: No acute osseous abnormality. No focal soft tissue abnormality. IMPRESSION: 1. No acute findings in the abdomen or pelvis. Electronically signed by: Oneil Devonshire MD 12/11/2024 07:39 PM EST RP Workstation: [id number]     Procedures   Medications Ordered in the ED  ketorolac  (TORADOL ) 15 MG/ML injection 30 mg (has no administration in time range)                                    Medical Decision Making Amount and/or Complexity of Data Reviewed Labs: ordered. Radiology: ordered.   lalThis patient presents to the ED for concern of abdominal pain differential diagnosis includes kidney stone, ovarian torsion, PID, tubo-ovarian abscess, appendicitis, UTI, pyelonephritis   Lab Tests:  I Ordered, and personally interpreted labs.  The pertinent results include: CBC unremarkable, UA unremarkable, Negative pregnancy, mildly elevated creatinine at 1.03 from a baseline of approximately 0.9   Imaging Studies ordered:  I ordered imaging studies including renal stone study I independently visualized and interpreted imaging which showed no  acute findings in the abdomen or pelvis. I agree with the radiologist interpretation   Medicines ordered and prescription drug management:  I ordered medication including Toradol     I have reviewed the patients home medicines and have made adjustments as needed   Problem List / ED Course:  Considered for admission or further workup however patient's vital signs, physical exam, labs, and imaging are reassuring.  Patient recently had contrasted imaging and ultrasound for similar complaint, I do not believe repeating this imaging would change patient disposition.  Patient advised to follow-up with her primary care or gastroenterology for further evaluation workup.  Patient given return precautions.  I feel patient is safe for discharge at this time.     Final diagnoses:  Abdominal pain, RLQ    ED Discharge  Orders     None          Francis Ileana SAILOR, PA-C 12/11/24 1942    Ruthe Cornet, DO 12/11/24 1948

## 2024-12-11 NOTE — Discharge Instructions (Addendum)
 Today you were seen for right lower quadrant abdominal pain.  Your workup on the emergency department was reassuring.  You may alternate Tylenol  Motrin  as needed for pain.  Please follow-up with your primary care or Dr. Dianna with gastroenterology if your symptoms persist.  Thank you for letting us  treat you today. After reviewing your labs and imaging, I feel you are safe to go home. Please follow up with your PCP in the next several days and provide them with your records from this visit. Return to the Emergency Room if pain becomes severe or symptoms worsen.
